# Patient Record
Sex: Female | Born: 1935 | Race: White | Hispanic: No | Smoking: Former smoker
Health system: Southern US, Community
[De-identification: ages and names within clinical notes are randomized; demographics above are authoritative.]

## PROBLEM LIST (undated history)

## (undated) DIAGNOSIS — I10 Essential (primary) hypertension: Secondary | ICD-10-CM

## (undated) DIAGNOSIS — R636 Underweight: Secondary | ICD-10-CM

## (undated) DIAGNOSIS — R5383 Other fatigue: Secondary | ICD-10-CM

## (undated) DIAGNOSIS — F0280 Dementia in other diseases classified elsewhere without behavioral disturbance: Secondary | ICD-10-CM

## (undated) DIAGNOSIS — R55 Syncope and collapse: Secondary | ICD-10-CM

## (undated) DIAGNOSIS — I4891 Unspecified atrial fibrillation: Secondary | ICD-10-CM

## (undated) HISTORY — DX: Essential (primary) hypertension: I10

## (undated) HISTORY — DX: Other fatigue: R53.83

## (undated) HISTORY — PX: HERNIA REPAIR: SHX51

## (undated) HISTORY — PX: TONSILLECTOMY: SHX5217

## (undated) HISTORY — DX: Unspecified atrial fibrillation: I48.91

## (undated) HISTORY — DX: Underweight: R63.6

## (undated) HISTORY — PX: ABDOMINAL HYSTERECTOMY: SHX81

## (undated) HISTORY — DX: Syncope and collapse: R55

## (undated) HISTORY — PX: SPLENECTOMY: SUR1306

---

## 2005-02-06 ENCOUNTER — Ambulatory Visit: Payer: Self-pay | Admitting: Internal Medicine

## 2005-06-13 ENCOUNTER — Ambulatory Visit: Payer: Self-pay | Admitting: Internal Medicine

## 2010-06-05 ENCOUNTER — Ambulatory Visit: Payer: Self-pay | Admitting: Internal Medicine

## 2010-09-02 HISTORY — PX: EYE SURGERY: SHX253

## 2012-03-04 ENCOUNTER — Ambulatory Visit: Payer: Self-pay | Admitting: Ophthalmology

## 2012-03-11 ENCOUNTER — Ambulatory Visit: Payer: Self-pay | Admitting: Ophthalmology

## 2012-04-27 ENCOUNTER — Ambulatory Visit: Payer: Self-pay | Admitting: Ophthalmology

## 2014-08-17 ENCOUNTER — Inpatient Hospital Stay: Payer: Self-pay | Admitting: Internal Medicine

## 2014-08-17 LAB — PROTIME-INR
INR: 1
PROTHROMBIN TIME: 13.1 s (ref 11.5–14.7)

## 2014-08-17 LAB — COMPREHENSIVE METABOLIC PANEL
ALT: 27 U/L
ANION GAP: 8 (ref 7–16)
Albumin: 3.9 g/dL (ref 3.4–5.0)
Alkaline Phosphatase: 87 U/L
BUN: 12 mg/dL (ref 7–18)
Bilirubin,Total: 0.5 mg/dL (ref 0.2–1.0)
CHLORIDE: 106 mmol/L (ref 98–107)
CO2: 25 mmol/L (ref 21–32)
Calcium, Total: 9.2 mg/dL (ref 8.5–10.1)
Creatinine: 1.03 mg/dL (ref 0.60–1.30)
EGFR (African American): >60
EGFR (Non-African Amer.): 55 — ABNORMAL LOW
GLUCOSE: 115 mg/dL — AB (ref 65–99)
Osmolality: 278 (ref 275–301)
Potassium: 3.8 mmol/L (ref 3.5–5.1)
SGOT(AST): 36 U/L (ref 15–37)
Sodium: 139 mmol/L (ref 136–145)
Total Protein: 7.8 g/dL (ref 6.4–8.2)

## 2014-08-17 LAB — CK TOTAL AND CKMB (NOT AT ARMC)
CK, TOTAL: 119 U/L (ref 26–192)
CK, TOTAL: 100 U/L (ref 26–192)
CK, Total: 89 U/L (ref 26–192)
CK-MB: 2 ng/mL (ref 0.5–3.6)
CK-MB: 1.7 ng/mL (ref 0.5–3.6)
CK-MB: 1.5 ng/mL (ref 0.5–3.6)

## 2014-08-17 LAB — TROPONIN I
Troponin-I: <0.02 ng/mL
Troponin-I: <0.02 ng/mL
Troponin-I: <0.02 ng/mL

## 2014-08-17 LAB — CBC WITH DIFFERENTIAL/PLATELET
Basophil #: 0.2 10*3/uL — ABNORMAL HIGH (ref 0.0–0.1)
Basophil %: 1.5 %
EOS ABS: 0 10*3/uL (ref 0.0–0.7)
Eosinophil %: 0.4 %
HCT: 42.7 % (ref 35.0–47.0)
HGB: 13.7 g/dL (ref 12.0–16.0)
LYMPHS PCT: 11.1 %
Lymphocyte #: 1.3 10*3/uL (ref 1.0–3.6)
MCH: 29.6 pg (ref 26.0–34.0)
MCHC: 32 g/dL (ref 32.0–36.0)
MCV: 93 fL (ref 80–100)
Monocyte #: 0.6 x10 3/mm (ref 0.2–0.9)
Monocyte %: 5.3 %
NEUTROS ABS: 9.2 10*3/uL — AB (ref 1.4–6.5)
NEUTROS PCT: 81.7 %
Platelet: 313 10*3/uL (ref 150–440)
RBC: 4.61 10*6/uL (ref 3.80–5.20)
RDW: 15.4 % — AB (ref 11.5–14.5)
WBC: 11.3 10*3/uL — ABNORMAL HIGH (ref 3.6–11.0)

## 2014-08-17 LAB — MAGNESIUM: MAGNESIUM: 2 mg/dL

## 2014-08-17 LAB — ETHANOL

## 2014-08-17 LAB — APTT

## 2014-08-17 LAB — LIPASE, BLOOD: Lipase: 340 U/L (ref 73–393)

## 2014-08-18 LAB — LIPID PANEL
CHOLESTEROL: 181 mg/dL (ref 0–200)
HDL Cholesterol: 63 mg/dL — ABNORMAL HIGH (ref 40–60)
Ldl Cholesterol, Calc: 105 mg/dL — ABNORMAL HIGH (ref 0–100)
Triglycerides: 66 mg/dL (ref 0–200)
VLDL Cholesterol, Calc: 13 mg/dL (ref 5–40)

## 2014-08-18 LAB — CBC WITH DIFFERENTIAL/PLATELET
Basophil #: 0.1 10*3/uL (ref 0.0–0.1)
Basophil %: 0.7 %
EOS ABS: 0.1 10*3/uL (ref 0.0–0.7)
Eosinophil %: 1.3 %
HCT: 38.4 % (ref 35.0–47.0)
HGB: 12.4 g/dL (ref 12.0–16.0)
LYMPHS ABS: 2 10*3/uL (ref 1.0–3.6)
Lymphocyte %: 24 %
MCH: 29.9 pg (ref 26.0–34.0)
MCHC: 32.4 g/dL (ref 32.0–36.0)
MCV: 92 fL (ref 80–100)
Monocyte #: 0.7 x10 3/mm (ref 0.2–0.9)
Monocyte %: 7.9 %
Neutrophil #: 5.5 10*3/uL (ref 1.4–6.5)
Neutrophil %: 66.1 %
Platelet: 299 10*3/uL (ref 150–440)
RBC: 4.16 10*6/uL (ref 3.80–5.20)
RDW: 15.1 % — ABNORMAL HIGH (ref 11.5–14.5)
WBC: 8.3 10*3/uL (ref 3.6–11.0)

## 2014-08-18 LAB — BASIC METABOLIC PANEL
ANION GAP: 8 (ref 7–16)
BUN: 13 mg/dL (ref 7–18)
CHLORIDE: 109 mmol/L — AB (ref 98–107)
CO2: 26 mmol/L (ref 21–32)
Calcium, Total: 8.7 mg/dL (ref 8.5–10.1)
Creatinine: 0.84 mg/dL (ref 0.60–1.30)
EGFR (African American): >60
GLUCOSE: 92 mg/dL (ref 65–99)
Osmolality: 285 (ref 275–301)
POTASSIUM: 3.5 mmol/L (ref 3.5–5.1)
SODIUM: 143 mmol/L (ref 136–145)

## 2014-08-18 LAB — TSH: THYROID STIMULATING HORM: 2.01 u[IU]/mL

## 2014-11-16 ENCOUNTER — Ambulatory Visit: Payer: Self-pay

## 2014-12-20 NOTE — Op Note (Signed)
PATIENT NAME:  Paula Thomas, Paula Thomas MR#:  [id number] DATE OF BIRTH:  1932/04/26  DATE OF PROCEDURE:  04/27/2012  PREOPERATIVE DIAGNOSIS:  Cataract, right eye.   POSTOPERATIVE DIAGNOSIS:  Cataract, right eye.  PROCEDURE PERFORMED:  Extracapsular cataract extraction using phacoemulsification with placement of an Alcon SN6CWS, 20.5-diopter posterior chamber lens, serial #[id number].  SURGEON:  Maylon PeppersSteven A. Samel Bruna, MD  ASSISTANT:  None.  ANESTHESIA:  4% lidocaine and 0.75% Marcaine in a 50/50 mixture with 10 units/mL of Hylenex added, given as a peribulbar.  ANESTHESIOLOGIST:  Dr. Dimple Caseyice  COMPLICATIONS:  None.  ESTIMATED BLOOD LOSS:  Less than 1 ml.  DESCRIPTION OF PROCEDURE:  The patient was brought to the operating room and given a peribulbar block.  The patient was then prepped and draped in the usual fashion.  The vertical rectus muscles were imbricated using 5-0 silk sutures.  These sutures were then clamped to the sterile drapes as bridle sutures.  A limbal peritomy was performed extending two clock hours and hemostasis was obtained with cautery.  A partial thickness scleral groove was made at the surgical limbus and dissected anteriorly in a lamellar dissection using an Alcon crescent knife.  The anterior chamber was entered superonasally with a Superblade and through the lamellar dissection with a 2.6 mm keratome.  DisCoVisc was used to replace the aqueous and a continuous tear capsulorrhexis was carried out.  Hydrodissection and hydrodelineation were carried out with balanced salt and a 27 gauge canula.  The nucleus was rotated to confirm the effectiveness of the hydrodissection.  Phacoemulsification was carried out using a divide-and-conquer technique.  Total ultrasound time was one minute and nine seconds with an average power of 14 percent. CDE 17.73.   Irrigation/aspiration was used to remove the residual cortex.  DisCoVisc was used to inflate the capsule and the internal incision  was enlarged to 3 mm with the crescent knife.  The intraocular lens was folded and inserted into the capsular bag using the AcrySert delivery system.  Irrigation/aspiration was used to remove the residual DisCoVisc.  Miostat was injected into the anterior chamber through the paracentesis track to inflate the anterior chamber and induce miosis.  The wound was checked for leaks and none were found. The conjunctiva was closed with cautery and the bridle sutures were removed.  Two drops of 0.3% Vigamox were placed on the eye.   An eye shield was placed on the eye.  The patient was discharged to the recovery room in good condition.   ____________________________ Maylon PeppersSteven A. Kambra Beachem, MD sad:ap D: 04/27/2012 13:49:00 ET T: 04/27/2012 14:08:38 ET JOB#: [id number]  cc: Viviann SpareSteven A. Eljay Lave, MD, <Dictator> Erline LevineSTEVEN A Raymonde Hamblin MD ELECTRONICALLY SIGNED 05/01/2012 12:40

## 2014-12-24 NOTE — Consult Note (Signed)
   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  Patient was noted to be in atrial fibrillation with rapid ventricular response.  She was given IV diltiazem and placed on an IV diltiazem drip.  Her rate is currently in   80-90  range with atrial fibrillation.  She is hemodynamically stable.  She is a somewhat difficult historian and is not aware of the surroundings or these situation that occurred at the time of her syncope.  She states she is had syncope in the past. she has been chronically anticoagulated with Eliquis.  Per the chart history, she had some facial droop prior to her syncopal episode.  Had CT revealed no acute pathology.   Physical Exam:  GEN well nourished, no acute distress   HEENT PERRL   NECK supple   RESP normal resp effort  no use of accessory muscles   CARD Irregular rate and rhythm  Tachycardic   ABD denies tenderness   LYMPH negative neck, negative axillae   EXTR negative cyanosis/clubbing, negative edema   SKIN normal to palpation   NEURO cranial nerves intact, motor/sensory function intact   PSYCH alert, poor insight   Review of Systems:  Subjective/Chief Complaint No complaints at present   General: No Complaints   Skin: No Complaints   ENT: No Complaints   Eyes: No Complaints   Neck: No Complaints   Respiratory: No Complaints   Cardiovascular: Palpitations   Gastrointestinal: No Complaints   Genitourinary: No Complaints   Vascular: No Complaints   Musculoskeletal: No Complaints   Neurologic: No Complaints   Hematologic: No Complaints   Endocrine: No Complaints   Psychiatric: No Complaints   Review of Systems: All other systems were reviewed and found to be negative   Medications/Allergies Reviewed Medications/Allergies reviewed   Family & Social History:  Family and Social History:  Family History Non-Contributory   EKG:  EKG Interp. by me    Interpretation atrial fibrillation with variable ventricular response    Penicillin: Unknown  Sulfa: Unknown   Impression 81 year old female with history of chronic atrial fibrillation treated with rate control and chronic anticoagulation with Eliquis who was admitted after suffering a apparent syncopal episode.  She apparently was volunteering a had a preschool when she was apparently noted to have a facial droop followed by a syncopal episode.  She was brought to the emergency room where she was noted to have atrial fibrillation with a rapid ventricular response with a heart rate in the 130 range.  Head CT did not reveal any acute process.  She was given IV diltiazem and her rate is improved.  She is ruled out for myocardia infarction thus far.  Will need to continue to follow or heart rate and continue Eliquis.  Will proceed with carotid Dopplers as well as an echocardiogram to evaluate for any significant structural abnormalities.  Will also follow for any arrhythmia.   Plan 1. Continue with Eliquis and IV diltiazem 2. Echocardiogram to evaluate LV function and for evidence of structural valvular abnormalities 3. Carotid Dopplers 4. follow on telemetry 5. The further recommendations depending on her course   Electronic Signatures: Dalia HeadingFath, Momina Hunton A (MD)  (Signed 16-Dec-15 15:40)  Authored: General Aspect/Present Illness, History and Physical Exam, Review of System, Family & Social History, EKG , Allergies, Impression/Plan   Last Updated: 16-Dec-15 15:40 by Dalia HeadingFath, Edyth Glomb A (MD)

## 2014-12-24 NOTE — H&P (Signed)
PATIENT NAME:  Paula Thomas, Paula Thomas MR#:  [id number] DATE OF BIRTH:  Nov 22, 1931  DATE OF ADMISSION:  08/17/2014  PRIMARY CARE PROVIDER: Nonlocal.  EMERGENCY DEPARTMENT REFERRING PHYSICIAN:  Sheran Fava. Quale, MD  CHIEF COMPLAINT: Syncope with collapse, as well as brief episode of a transient right-sided facial droop now with atrial fibrillation with rapid ventricular response.   HISTORY OF PRESENT ILLNESS: The patient is a 81 year old white female with history of having atrial fibrillation with a diagnosis for the past 6 months, who has seen Dr. Juliann Pares in the past and is on Eliquis. She was outside today, standing, when all of a sudden she started becoming limp and started having right-sided facial droop and she was taken to the chair where her hands kind of became limp bilaterally. Then, she was not responsive for about 5 minutes. She was brought to the ED and in the ER, she was noted to have a heart rate that is elevated in the 140s. The patient's face is back to normal. She denies any other weakness, no chest pains, no shortness of breath. Denies any nausea, vomiting or diarrhea.   PAST MEDICAL HISTORY: Significant for atrial fibrillation for the past 6 months, history of hypertension.   PAST SURGICAL HISTORY: Status post tonsillectomy, hysterectomy, splenectomy at age 13.   ALLERGIES: None.   MEDICATIONS: She is on Eliquis 2.5 mg 1 tab p.o. b.i.d., amlodipine 5 daily, and multivitamin 1 tab p.o. daily.   SOCIAL HISTORY: No smoking, no alcohol or drug use.   FAMILY HISTORY: Positive for lung cancer in the family.   REVIEW OF SYSTEMS: CONSTITUTIONAL: Denies any fevers. No weight loss. No weight gain.  EYES: No blurred or double vision. No cataracts. No glaucoma.  ENT: No tinnitus. No ear pain. No hearing loss. No seasonal or year-round allergies. No difficulty swallowing.  RESPIRATORY: Denies any cough, wheezing, hemoptysis. No, dyspnea. No COPD.  CARDIOVASCULAR: Denies any chest pain,  orthopnea, edema. Has history of atrial fibrillation.  GASTROINTESTINAL: No nausea, vomiting, diarrhea. No abdominal pain. No hematemesis. No melena. No IBS. No jaundice.  GENITOURINARY: Denies any dysuria, hematuria, renal calculus or frequency.  ENDOCRINE: Denies any polyuria, nocturia, thyroid problems.  HEMATOLOGIC AND LYMPHATIC: Denies anemia, easy bruisability or bleeding.  SKIN: No acne. No rash.  MUSCULOSKELETAL: No pain in the neck, back or shoulder.  NEUROLOGIC: No previous CVA, TIA or seizure.  PSYCHIATRIC: No anxiety, insomnia, or ADD.   PHYSICAL EXAMINATION:  VITAL SIGNS: Temperature 98.6, pulse was 144, respirations 18, blood pressure 155/100.  GENERAL: The patient is a thin female.  HEENT: Head atraumatic, normocephalic. Pupils equally round, reactive to light and accommodation. There is no conjunctival pallor. No sclerae icterus. Nasal exam shows no drainage or ulceration. Oropharynx clear without any exudate.  NECK: Supple without any thyromegaly.  CARDIOVASCULAR: Irregularly irregular rhythm, tachycardic. No murmurs, rubs, clicks, or gallops.  LUNGS: Clear to auscultation bilaterally without any rales, rhonchi, wheezing.  ABDOMEN: Soft, nontender, nondistended. Positive bowel sounds x4. No hepatosplenomegaly.  EXTREMITIES: No clubbing, cyanosis or edema.  SKIN: No rash.  LYMPH NODES: Nonpalpable.  VASCULAR: Good DP, PT pulses.  PSYCHIATRIC: Not anxious.  NEUROLOGIC: Cranial nerves 2 through 12 grossly intact. No focal deficits.  SKIN: No rash.  MUSCULOSKELETAL: There is no erythema or swelling.   EVALUATIONS:  Glucose 115, BUN 12, creatinine 1.03, sodium 139, potassium 3.8, chloride 106, CO2 of 25, calcium 9.2. Lipase 340. LFTs are normal. Troponin less than 0.02. WBC 11,300, hemoglobin 13.7, platelet count 313,000. INR  1.0. CT scan of the head without contrast showed no acute intracranial abnormality.   ASSESSMENT AND PLAN: The patient is a 81 year old, history of  atrial fibrillation on Eliquis, was standing when she developed facial droop, then passed out. Noted to have a heart rate in the 130s in the emergency room.  1. Atrial fibrillation with rapid ventricular response. At this time 1 dose of IV Cardizem will be given and then an IV Cardizem drip will be started. We will continue Eliquis. Cardiology consult for that.  2. Syncope with collapse, possibly could be related to her heart rate being high and her blood pressure dropping. We will monitor on telemetry. Also work-up for syncope, which will include echocardiogram and carotid Dopplers. Monitor for any other arrhythmias. 3. Possible transient ischemic attack. Low-dose aspirin. Continue Eliquis. Will get an echocardiogram and carotid Dopplers.  4. History of hypertension, will be on Cardizem drip. Hold Norvasc for the time-being 5. Miscellaneous. The patient already on Eliquis which should suffice for deep vein thrombosis prophylaxis.   TIME SPENT: 50 minutes on this H and P.     ____________________________ Serita GritShreyang H. Allena KatzPatel, MD shp:kl D: 08/17/2014 14:44:52 ET T: 08/17/2014 16:18:12 ET JOB#: [id number]  cc: Tiersa Dayley H. Allena KatzPatel, MD, <Dictator> Charise CarwinSHREYANG H Geraldine Tesar MD ELECTRONICALLY SIGNED 08/24/2014 16:09

## 2014-12-25 NOTE — Op Note (Signed)
PATIENT NAME:  Paula Thomas, Paula Thomas MR#:  [id number] DATE OF BIRTH:  May 29, 1932  DATE OF PROCEDURE:  03/11/2012  PREOPERATIVE DIAGNOSIS:  Cataract, left eye.    POSTOPERATIVE DIAGNOSIS:  Cataract, left eye.  PROCEDURE PERFORMED:  Extracapsular cataract extraction using phacoemulsification with placement of an Alcon SN6CWS, 20.5-diopter posterior chamber lens, serial # [id number].  SURGEON:  Maylon PeppersSteven A. Kayli Beal, MD  ASSISTANT:  None.  ANESTHESIA:  4% lidocaine and 0.75% Marcaine in a 50/50 mixture with 10 units per mL of Hylenex added, given as a peribulbar.  ANESTHESIOLOGIST:  Dr. Dimple Caseyice   COMPLICATIONS:  None.  ESTIMATED BLOOD LOSS:  Less than 1 mL.  DESCRIPTION OF PROCEDURE:  The patient was brought to the operating room and given a peribulbar block.  The patient was then prepped and draped in the usual fashion.  The vertical rectus muscles were imbricated using 5-0 silk sutures.  These sutures were then clamped to the sterile drapes as bridle sutures.  A limbal peritomy was performed extending two clock hours and hemostasis was obtained with cautery.  A partial thickness scleral groove was made at the surgical limbus and dissected anteriorly in a lamellar dissection using an Alcon crescent knife.  The anterior chamber was entered supero-temporally with a Superblade and through the lamellar dissection with a 2.6 mm keratome.  DisCoVisc was used to replace the aqueous and a continuous tear capsulorrhexis was carried out.  Hydrodissection and hydrodelineation were carried out with balanced salt and a 27 gauge canula.  The nucleus was rotated to confirm the effectiveness of the hydrodissection.  Phacoemulsification was carried out using a divide-and-conquer technique.  Total ultrasound time was 1 minute and 37 seconds with an average power of 14 percent, CDE 23.29.  Irrigation/aspiration was used to remove the residual cortex.  DisCoVisc was used to inflate the capsule and the internal  incision was enlarged to 3 mm with the crescent knife.  The intraocular lens was folded and inserted into the capsular bag using the AcrySert delivery system.  Irrigation/aspiration was used to remove the residual DisCoVisc.  Miostat was injected into the anterior chamber through the paracentesis track to inflate the anterior chamber and induce miosis.  The wound was checked for leaks and none were found. The conjunctiva was closed with cautery and the bridle sutures were removed.  Two drops of 0.3% Vigamox were placed on the eye.   An eye shield was placed on the eye.  The patient was discharged to the recovery room in good condition.  ____________________________ Maylon PeppersSteven A. Phylisha Dix, MD sad:drc D: 03/11/2012 11:56:47 ET T: 03/11/2012 12:20:09 ET JOB#: [id number]  cc: Viviann SpareSteven A. Treyshaun Keatts, MD, <Dictator> Erline LevineSTEVEN A Chalice Philbert MD ELECTRONICALLY SIGNED 03/16/2012 13:55

## 2014-12-28 NOTE — Discharge Summary (Signed)
PATIENT NAME:  Paula Thomas, Paula Thomas MR#:  [id number] DATE OF BIRTH:  1932/08/08  DATE OF ADMISSION:  08/17/2014 DATE OF DISCHARGE:  08/18/2014  ADMITTING DIAGNOSES:  1.  Syncope.  2.  Atrial fibrillation.   DISCHARGE DIAGNOSES: 1.  Syncope. 2.  Atrial fibrillation, rapid ventricular response.  3.  Transient ischemic attack with right facial droop, resolved.   4.  Hyperlipidemia with LDL of 105.  5.  Carotid artery stenosis of less than 50% on ultrasound.  6.  Malignant essential hypertension.   DISCHARGE CONDITION: Stable.   DISCHARGE MEDICATIONS:  1.  The patient is to continue multivitamins 1 daily.  2.  Eliquis 5 mg p.o. twice daily, this medication doses is advanced now; however, it is recommended to decreased patient's Eliquis dose to 2.5 daily dose when she reaches above 15 years old, which is going to be in the next few months.  3.  Aspirin 81 mg p.o. daily.  4.  Lisinopril 5 mg p.o. daily.  5.  Atorvastatin 40 mg p.o. daily.  6.  Diltiazem CD 120 mg p.o. twice daily.  7.  The patient is not to take Norvasc.   HOME OXYGEN: None.   DIET: 2 grams salt, low-fat, low-cholesterol, regular consistency.   ACTIVITY LIMITATIONS: As tolerated.   FOLLOWUP: Appointment with Dr. Arlana Pouch in 2 days after discharge, Dr. Lady Gary in 1 week after discharge.    CONSULTANTS: Care management, social work, Dr. Lady Gary.   RADIOLOGIC STUDIES: CT scan of head without contrast 08/17/2014, showed no acute intracranial abnormality, atrophy and chronic microvascular white matter ischemic changes were noted. Carotid ultrasound 08/18/2014, revealed bilateral carotid bifurcation and proximal RCA plaque resulting in less than 50% diameter stenosis, the exam does not exclude plaque ulceration or embolization, continued surveillance was recommended. Echocardiogram 08/18/2014, revealed left ventricular ejection fraction by visual estimation 70%-75%, normal global left ventricular systolic function, mildly dilated right  atrium, trivial pericardial effusion as described above, mild mitral valve regurgitation, mild aortic regurgitation, moderate tricuspid regurgitation.   HISTORY OF PRESENT ILLNESS: The patient is a 81 year old Caucasian female with past medical history significant for history of atrial fibrillation, history of hypertension who presents to the hospital with complaints of syncopal episode. Please refer to Dr. Serita Grit Patel's admission note on 08/17/2014. Apparently, patient was outside standing and all of a sudden she became limp and had right-sided facial droop and became unresponsive approximately 5 minutes. She was brought to Emergency Room. She was noted to have atrial fibrillation, heart rate of 140. On arrival to the hospital the patient's vital signs temperature was 98.6, pulse 144, respiration rate was 18, blood pressure 155/100. Physical exam was unremarkable except for irregularly irregular cardiac rythm. The patient's lab data done on arrival to the hospital revealed elevated glucose level to 115, otherwise BMP was unremarkable. The patient's lipase level was normal at 340. Magnesium was normal at 2.0. The patient's liver enzymes were normal. Cardiac enzymes x 3 were within normal limits. TSH was normal at 2.01. White blood cell count was mildly elevated to 11.3, hemoglobin was 15.7 and platelet count 313,000, absolute neutrophil count was elevated at 9.2. Coagulation panel was normal. EKG showed atrial fibrillation at rate of 123 beats per minute, no acute ST-T changes were noted. Chest x-ray was not done; however, the patient CT of head was performed to rule out stroke. She was admitted to the hospital with diagnosis of syncope, TIA, as well as atrial fibrillation, RVR. She had her Eliquis advanced to 5 mg twice  daily dose. Consultation with Dr. Lady Thomas was obtained. Dr. Lady Thomas saw the patient in consultation the same day, 08/17/2014. He felt the patient should continue Eliquis as well as her Cardizem IV  since the patient was placed on IV Cardizem drip. He recommended echocardiogram to evaluate left ventricular function and for evidence of structural valvular abnormalities. He recommended carotid Doppler and telemetry. The patient was continued in the intensive care unit on IV Cardizem, which was weaned off. She was started on oral long-acting Cardizem. Her heart rate remained stable in 90s when Cardizem CD was initiated. However, patient's heart rate increased to 140 as soon as she started ambulating necessitating advancement of Cardizem, which decision was made to advance it to twice daily dose. The patient is to continue Eliquis as well as aspirin therapy for TIA. Carotid ultrasound was performed, which revealed less than 50% stenosis. A lipid panel revealed LDL at 105. The patient was initiated on Lipitor to stabilize carotid plaque and decrease LDL to preferably below 70. The patient had echocardiogram, which was unremarkable except for minimal changes. In regard to malignant hypertension, the patient was initiated on Cardizem as mentioned above, and lisinopril was added to better control her blood pressure. On the day of discharge, the patient felt satisfactory, did not complain of any significant discomfort. She is being discharged home with the above-mentioned medications and followup. On the day of discharge, temperature was 98.3, pulse ranging from 81-140 on ambulation, respiration rate was 14-23, blood pressure 160/91, saturation was 98% on room air at rest.   TIME SPENT: 40 minutes.    ____________________________ Paula Caperima Fausto Sampedro, MD rv:bm D: 08/18/2014 19:37:00 ET T: 08/19/2014 04:59:08 ET JOB#: [id number]  cc: Paula FallenKenneth A. Lady GaryFath, MD Paula Bucksenny C. Arlana Pouchate, MD Paula Caperima Zoei Amison, MD, <Dictator>  Paula Cammack MD ELECTRONICALLY SIGNED 09/05/2014 21:11

## 2015-03-02 ENCOUNTER — Other Ambulatory Visit: Payer: Self-pay

## 2015-03-02 NOTE — Telephone Encounter (Signed)
Patients pharmacy is [facility]

## 2015-03-03 MED ORDER — LOSARTAN POTASSIUM 50 MG PO TABS: 50.0000 mg | ORAL_TABLET | Freq: Every day | ORAL | Status: DC

## 2015-03-07 DIAGNOSIS — N181 Chronic kidney disease, stage 1: Secondary | ICD-10-CM

## 2015-03-07 DIAGNOSIS — R55 Syncope and collapse: Secondary | ICD-10-CM | POA: Insufficient documentation

## 2015-03-07 DIAGNOSIS — R5383 Other fatigue: Secondary | ICD-10-CM | POA: Insufficient documentation

## 2015-03-07 DIAGNOSIS — N183 Chronic kidney disease, stage 3 unspecified: Secondary | ICD-10-CM

## 2015-03-07 DIAGNOSIS — I129 Hypertensive chronic kidney disease with stage 1 through stage 4 chronic kidney disease, or unspecified chronic kidney disease: Secondary | ICD-10-CM | POA: Insufficient documentation

## 2015-03-07 DIAGNOSIS — I4891 Unspecified atrial fibrillation: Secondary | ICD-10-CM

## 2015-03-07 DIAGNOSIS — I10 Essential (primary) hypertension: Secondary | ICD-10-CM

## 2015-03-07 DIAGNOSIS — R636 Underweight: Secondary | ICD-10-CM

## 2015-03-07 DIAGNOSIS — M1712 Unilateral primary osteoarthritis, left knee: Secondary | ICD-10-CM

## 2015-03-10 ENCOUNTER — Ambulatory Visit: Payer: Self-pay | Admitting: Unknown Physician Specialty

## 2015-03-17 ENCOUNTER — Ambulatory Visit (INDEPENDENT_AMBULATORY_CARE_PROVIDER_SITE_OTHER): Payer: Medicare Other | Admitting: Unknown Physician Specialty

## 2015-03-17 ENCOUNTER — Encounter: Payer: Self-pay | Admitting: Unknown Physician Specialty

## 2015-03-17 VITALS — BP 156/85 | HR 82 | Temp 98.1°F | Ht 62.5 in | Wt 111.6 lb

## 2015-03-17 DIAGNOSIS — N181 Chronic kidney disease, stage 1: Secondary | ICD-10-CM | POA: Diagnosis not present

## 2015-03-17 DIAGNOSIS — I129 Hypertensive chronic kidney disease with stage 1 through stage 4 chronic kidney disease, or unspecified chronic kidney disease: Secondary | ICD-10-CM | POA: Diagnosis not present

## 2015-03-17 DIAGNOSIS — M1712 Unilateral primary osteoarthritis, left knee: Secondary | ICD-10-CM

## 2015-03-17 MED ORDER — LOSARTAN POTASSIUM 100 MG PO TABS: 100.0000 mg | ORAL_TABLET | Freq: Every day | ORAL | Status: DC

## 2015-03-17 NOTE — Progress Notes (Signed)
BP 156/85 mmHg  Pulse 82  Temp(Src) 98.1 F (36.7 C)  Ht 5' 2.5" (1.588 m)  Wt 111 lb 9.6 oz (50.621 kg)  BMI 20.07 kg/m2  SpO2 95%   Subjective:    Patient ID: Paula Thomas, female    DOB: 06-Apr-1932, 81 y.o.   MRN: [id number]  HPI: Paula Thomas is a 81 y.o. female  Chief Complaint  Patient presents with  . Hypertension  . Knee Pain    pt states she got a knee injection at last appt but knee is now swollen again and it hurts when she walks    Relevant past medical, surgical, family and social history reviewed and updated as indicated. Interim medical history since our last visit reviewed. Allergies and medications reviewed and updated.  1.  HYPERTENSION Taking Losartan.  Does not check it at home so doesn't know what home readings are.   Hypertension status:  stable  H6  Satisfied with current treatment?  yes  H6  Duration of hypertension:  chronic  H4   BP medication side effects:  no P1  Medication compliance:  excellent compliance  P1  Previous BP meds:  none  P1  Aspirin:  no  Recurrent headaches:  no  R10 Visual changes:  no  R2  Palpitations:  no  R4  Dyspnea:  no  R5  Chest pain:  no  R4 Lower extremity edema:  no  R4 Dizzy/lightheaded:  no  R10   2.  KNEE PAIN Left knee Injected last visit.  Now it is swollen again. Stated that it is now back swollen and painful starting about 2 weeks ago     Duration:  chronic H4  Involved knee:  left  H1 Mechanism of injury:  unknown  Location:  diffuse H1 Severity:  comes and goes H3  Quality:  dull  H2  Frequency:  constant H5 Aggravating factors:  walking.  H7  Alleviating factors:  rest. H7 Status:  worse.  H6 Relief with NSAIDs?: aspercream H7  Sensation of giving way:  no  R8 Locking:  no  R8 Popping:  no  R8 Bruising:  no  R13   Swelling:  yes  R8 Redness:  no  R9    Review of Systems  Per HPI unless specifically indicated above     Objective:    BP 156/85 mmHg  Pulse 82   Temp(Src) 98.1 F (36.7 C)  Ht 5' 2.5" (1.588 m)  Wt 111 lb 9.6 oz (50.621 kg)  BMI 20.07 kg/m2  SpO2 95%  Wt Readings from Last 3 Encounters:  03/17/15 111 lb 9.6 oz (50.621 kg)  12/28/14 114 lb (51.71 kg)    Physical Exam  Constitutional: She is oriented to person, place, and time. She appears well-developed and well-nourished. No distress.  HENT:  Head: Normocephalic and atraumatic.  Eyes: Conjunctivae and lids are normal. Right eye exhibits no discharge. Left eye exhibits no discharge. No scleral icterus.  Cardiovascular: Normal rate, regular rhythm and normal heart sounds.   Pulmonary/Chest: Effort normal and breath sounds normal. No respiratory distress.  Abdominal: Normal appearance. She exhibits no distension. There is no splenomegaly or hepatomegaly. There is no tenderness.  Musculoskeletal:       Left knee: She exhibits swelling.  Neurological: She is alert and oriented to person, place, and time.  Skin: Skin is intact. No rash noted. No pallor.  Psychiatric: She has a normal mood and affect. Her behavior is normal. Judgment and  thought content normal.  Vitals reviewed.       Assessment & Plan:   Problem List Items Addressed This Visit      Cardiovascular and Mediastinum   Benign hypertension with CKD (chronic kidney disease) stage I - Primary    Not optimally controlled.  Increase Losartan to 100 mg      Relevant Medications   losartan (COZAAR) 100 MG tablet     Musculoskeletal and Integument   Osteoarthritis of left knee    Refer to orthopedics as steroid shot only working for about 3 months      Relevant Orders   Ambulatory referral to Orthopedic Surgery        Follow up plan: Return in about 4 weeks (around 04/14/2015).

## 2015-03-17 NOTE — Patient Instructions (Signed)
Increased Losartan from 50 to 100 mg.  You can take 2 50 mg tabs until finished and pick up a new prescription for 100 mg at the pharmacy.

## 2015-03-17 NOTE — Assessment & Plan Note (Signed)
Not optimally controlled.  Increase Losartan to 100 mg

## 2015-03-17 NOTE — Assessment & Plan Note (Signed)
Refer to orthopedics as steroid shot only working for about 3 months

## 2015-03-24 ENCOUNTER — Ambulatory Visit
Admission: EM | Admit: 2015-03-24 | Discharge: 2015-03-24 | Disposition: A | Discharge status: Home / Self Care | Payer: Medicare Other | Attending: Family Medicine | Admitting: Family Medicine

## 2015-03-24 ENCOUNTER — Encounter: Payer: Self-pay | Admitting: Emergency Medicine

## 2015-03-24 DIAGNOSIS — M5432 Sciatica, left side: Secondary | ICD-10-CM

## 2015-03-24 MED ORDER — HYDROCODONE-ACETAMINOPHEN 5-325 MG PO TABS: 1.0000 | ORAL_TABLET | Freq: Four times a day (QID) | ORAL | Status: DC | PRN

## 2015-03-24 MED ORDER — PREDNISONE 20 MG PO TABS
20.0000 mg | ORAL_TABLET | Freq: Every day | ORAL | Status: DC
Start: 2015-03-24 — End: 2015-04-14

## 2015-03-24 NOTE — ED Provider Notes (Signed)
CSN: [id number]     Arrival date & time 03/24/15  1058 History   First MD Initiated Contact with Patient 03/24/15 1150     Chief Complaint  Patient presents with  . Leg Pain    left upper leg   (Consider location/radiation/quality/duration/timing/severity/associated sxs/prior Treatment) HPI Comments: 81 yo female with a 3-4 days h/o left hip pain and low back pain radiating down the leg. States 5 days ago she received a cortisone injection in her left knee by her orthopedist. Since then has experienced the hip and low back pain and patient is unsure if this is related to her injection. Denies any fevers, chills, falls, injuries, rash. States it's more painful with position changes. Denies any numbness/tingling, bowel or bladder problems. She does feel unsteady when walking and states she does not have a cane or a walker.   The history is provided by the patient.    Past Medical History  Diagnosis Date  . Atrial fibrillation   . Syncope   . Underweight   . Fatigue   . Hypertension    Past Surgical History  Procedure Laterality Date  . Eye surgery Bilateral 2012    cataracts  . Hernia repair    . Splenectomy    . Tonsillectomy    . Abdominal hysterectomy     Family History  Problem Relation Age of Onset  . Cancer Mother   . Cancer Father   . Heart attack Brother   . Heart attack Brother    History  Substance Use Topics  . Smoking status: Former Games developer  . Smokeless tobacco: Never Used  . Alcohol Use: No   OB History    No data available     Review of Systems  Allergies  Lisinopril; Penicillin g potassium; and Sulfa antibiotics  Home Medications   Prior to Admission medications   Medication Sig Start Date End Date Taking? Authorizing Provider  apixaban (ELIQUIS) 5 MG TABS tablet Take 5 mg by mouth daily.    Historical Provider, MD  atorvastatin (LIPITOR) 40 MG tablet Take 40 mg by mouth daily.    Historical Provider, MD  diclofenac sodium (VOLTAREN) 1 % GEL  Apply topically 4 (four) times daily as needed.    Historical Provider, MD  diltiazem (CARDIZEM) 120 MG tablet Take 120 mg by mouth daily.    Historical Provider, MD  HYDROcodone-acetaminophen (NORCO/VICODIN) 5-325 MG per tablet Take 1 tablet by mouth every 6 (six) hours as needed. 03/24/15   Payton Mccallum, MD  losartan (COZAAR) 100 MG tablet Take 1 tablet (100 mg total) by mouth daily. 03/17/15   Gabriel Cirri, NP  Multiple Vitamin (MULTI-VITAMINS) TABS Take by mouth.    Historical Provider, MD  predniSONE (DELTASONE) 20 MG tablet Take 1 tablet (20 mg total) by mouth daily. 03/24/15   Payton Mccallum, MD   BP 143/94 mmHg  Pulse 93  Temp(Src) 98 F (36.7 C) (Tympanic)  Resp 16  Ht  (1.651 m)  Wt 120 lb (54.432 kg)  BMI 19.97 kg/m2  SpO2 97% Physical Exam  Constitutional: She appears well-developed and well-nourished. No distress.  HENT:  Head: Normocephalic.  Musculoskeletal:       Left hip: Normal.       Left knee: Normal.       Lumbar back: She exhibits tenderness. She exhibits no bony tenderness, no swelling, no edema, no deformity, no laceration and normal pulse.  Tenderness to palpation over the lumbar sacral paraspinous muscles on the left and  over the left buttock area; skin intact; no rashes; no bony tenderness  Neurological: She is alert. She has normal reflexes. She displays normal reflexes. She exhibits normal muscle tone. Coordination normal.  Skin: No rash noted. She is not diaphoretic.  Nursing note and vitals reviewed.   ED Course  Procedures (including critical care time) Labs Review Labs Reviewed - No data to display  Imaging Review No results found.   MDM   1. Sciatica, left    Discharge Medication List as of 03/24/2015 12:17 PM    START taking these medications   Details  HYDROcodone-acetaminophen (NORCO/VICODIN) 5-325 MG per tablet Take 1 tablet by mouth every 6 (six) hours as needed., Starting 03/24/2015, Until Discontinued, Print    predniSONE  (DELTASONE) 20 MG tablet Take 1 tablet (20 mg total) by mouth daily., Starting 03/24/2015, Until Discontinued, Normal      Plan: 1. diagnosis reviewed with patient 2. rx as per orders; risks, benefits, potential side effects reviewed with patient 3. Recommend supportive treatment with rest, ice/heat 4. Patient given a walker in clinic (high risk for fall; and on blood thinner) 5. F/u prn if symptoms worsen or don't improve    Payton Mccallum, MD 03/24/15 [phone]

## 2015-03-24 NOTE — ED Notes (Signed)
Patient c/o left upper leg pain for a week.  Patient denies injury

## 2015-03-24 NOTE — Discharge Instructions (Signed)
Sciatica Sciatica is pain, weakness, numbness, or tingling along the path of the sciatic nerve. The nerve starts in the lower back and runs down the back of each leg. The nerve controls the muscles in the lower leg and in the back of the knee, while also providing sensation to the back of the thigh, lower leg, and the sole of your foot. Sciatica is a symptom of another medical condition. For instance, nerve damage or certain conditions, such as a herniated disk or bone spur on the spine, pinch or put pressure on the sciatic nerve. This causes the pain, weakness, or other sensations normally associated with sciatica. Generally, sciatica only affects one side of the body. CAUSES   Herniated or slipped disc.  Degenerative disk disease.  A pain disorder involving the narrow muscle in the buttocks (piriformis syndrome).  Pelvic injury or fracture.  Pregnancy.  Tumor (rare). SYMPTOMS  Symptoms can vary from mild to very severe. The symptoms usually travel from the low back to the buttocks and down the back of the leg. Symptoms can include:  Mild tingling or dull aches in the lower back, leg, or hip.  Numbness in the back of the calf or sole of the foot.  Burning sensations in the lower back, leg, or hip.  Sharp pains in the lower back, leg, or hip.  Leg weakness.  Severe back pain inhibiting movement. These symptoms may get worse with coughing, sneezing, laughing, or prolonged sitting or standing. Also, being overweight may worsen symptoms. DIAGNOSIS  Your caregiver will perform a physical exam to look for common symptoms of sciatica. He or she may ask you to do certain movements or activities that would trigger sciatic nerve pain. Other tests may be performed to find the cause of the sciatica. These may include:  Blood tests.  X-rays.  Imaging tests, such as an MRI or CT scan. TREATMENT  Treatment is directed at the cause of the sciatic pain. Sometimes, treatment is not necessary  and the pain and discomfort goes away on its own. If treatment is needed, your caregiver may suggest:  Over-the-counter medicines to relieve pain.  Prescription medicines, such as anti-inflammatory medicine, muscle relaxants, or narcotics.  Applying heat or ice to the painful area.  Steroid injections to lessen pain, irritation, and inflammation around the nerve.  Reducing activity during periods of pain.  Exercising and stretching to strengthen your abdomen and improve flexibility of your spine. Your caregiver may suggest losing weight if the extra weight makes the back pain worse.  Physical therapy.  Surgery to eliminate what is pressing or pinching the nerve, such as a bone spur or part of a herniated disk. HOME CARE INSTRUCTIONS   Only take over-the-counter or prescription medicines for pain or discomfort as directed by your caregiver.  Apply ice to the affected area for 20 minutes, 3-4 times a day for the first 48-72 hours. Then try heat in the same way.  Exercise, stretch, or perform your usual activities if these do not aggravate your pain.  Attend physical therapy sessions as directed by your caregiver.  Keep all follow-up appointments as directed by your caregiver.  Do not wear high heels or shoes that do not provide proper support.  Check your mattress to see if it is too soft. A firm mattress may lessen your pain and discomfort. SEEK IMMEDIATE MEDICAL CARE IF:   You lose control of your bowel or bladder (incontinence).  You have increasing weakness in the lower back, pelvis, buttocks,   or legs.  You have redness or swelling of your back.  You have a burning sensation when you urinate.  You have pain that gets worse when you lie down or awakens you at night.  Your pain is worse than you have experienced in the past.  Your pain is lasting longer than 4 weeks.  You are suddenly losing weight without reason. MAKE SURE YOU:  Understand these  instructions.  Will watch your condition.  Will get help right away if you are not doing well or get worse. Document Released: 08/13/2001 Document Revised: 02/18/2012 Document Reviewed: 12/29/2011 Exit[employer] Patient Information 2015 [employer]. This information is not intended to replace advice given to you by your health care provider. Make sure you discuss any questions you have with your health care provider.  

## 2015-03-27 ENCOUNTER — Ambulatory Visit: Payer: Medicare Other | Admitting: Unknown Physician Specialty

## 2015-03-29 ENCOUNTER — Encounter: Payer: Self-pay | Admitting: Unknown Physician Specialty

## 2015-03-29 ENCOUNTER — Ambulatory Visit
Admission: RE | Admit: 2015-03-29 | Discharge: 2015-03-29 | Disposition: A | Discharge status: Home / Self Care | Payer: Medicare Other | Source: Ambulatory Visit | Attending: Unknown Physician Specialty | Admitting: Unknown Physician Specialty

## 2015-03-29 ENCOUNTER — Ambulatory Visit (INDEPENDENT_AMBULATORY_CARE_PROVIDER_SITE_OTHER): Payer: Medicare Other | Admitting: Unknown Physician Specialty

## 2015-03-29 VITALS — BP 124/75 | HR 98 | Temp 98.1°F | Ht 63.5 in | Wt 109.8 lb

## 2015-03-29 DIAGNOSIS — N181 Chronic kidney disease, stage 1: Secondary | ICD-10-CM | POA: Diagnosis not present

## 2015-03-29 DIAGNOSIS — M5432 Sciatica, left side: Secondary | ICD-10-CM

## 2015-03-29 DIAGNOSIS — I129 Hypertensive chronic kidney disease with stage 1 through stage 4 chronic kidney disease, or unspecified chronic kidney disease: Secondary | ICD-10-CM

## 2015-03-29 DIAGNOSIS — G3184 Mild cognitive impairment, so stated: Secondary | ICD-10-CM | POA: Diagnosis not present

## 2015-03-29 MED ORDER — LOSARTAN POTASSIUM 100 MG PO TABS: 100.0000 mg | ORAL_TABLET | Freq: Every day | ORAL | Status: DC

## 2015-03-29 MED ORDER — METHYLPREDNISOLONE 4 MG PO TBPK: ORAL_TABLET | ORAL | Status: DC

## 2015-03-29 NOTE — Progress Notes (Signed)
BP 124/75 mmHg  Pulse 98  Temp(Src) 98.1 F (36.7 C)  Ht 5' 3.5" (1.613 m)  Wt 109 lb 12.8 oz (49.805 kg)  BMI 19.14 kg/m2  SpO2 96%  LMP  (LMP Unknown)   Subjective:    Patient ID: Paula Thomas, female    DOB: 1931-12-09, 81 y.o.   MRN: [id number]  HPI: Paula Thomas is a 81 y.o. female  Chief Complaint  Patient presents with  . Hypertension    Relevant past medical, surgical, family and social history reviewed and updated as indicated. Interim medical history since our last visit reviewed. Allergies and medications reviewed and updated.  Hypertension I wanted to her back to f/u of her BP after increasing Losartan.  Her BP is good.  No complaints of SOB or chest pain.    Knee pain Saw Orthopedics for knee pain.  Got an injection and now it feels better.    Sciatica Went to the ER for sciatica pain.  After her knee injection, started having left low back pain.  She is using a walker which is helping her back pain.  Points to her low back, around the knee, and the ankle which hurts. She is having trouble navigating the walker.  Left ankle is swollen and has been that way for 2 days.  Of note, pt is taking a blood thinner for about 2 days.    Memory  Some issues with memory noticed by me and her daughter.    Review of Systems  Per HPI unless specifically indicated above     Objective:    BP 124/75 mmHg  Pulse 98  Temp(Src) 98.1 F (36.7 C)  Ht 5' 3.5" (1.613 m)  Wt 109 lb 12.8 oz (49.805 kg)  BMI 19.14 kg/m2  SpO2 96%  LMP  (LMP Unknown)  Wt Readings from Last 3 Encounters:  03/29/15 109 lb 12.8 oz (49.805 kg)  03/24/15 120 lb (54.432 kg)  03/17/15 111 lb 9.6 oz (50.621 kg)    Physical Exam  Constitutional: She is oriented to person, place, and time. She appears well-developed and well-nourished. No distress.  HENT:  Head: Normocephalic and atraumatic.  Eyes: Conjunctivae and lids are normal. Right eye exhibits no discharge. Left eye exhibits no  discharge. No scleral icterus.  Cardiovascular: Normal rate and regular rhythm.   Pulmonary/Chest: Effort normal. No respiratory distress.  Abdominal: Normal appearance. There is no splenomegaly or hepatomegaly.  Musculoskeletal:  Left ankle with some generalized swelling.    Neurological: She is alert and oriented to person, place, and time.  Skin: Skin is warm, dry and intact. No rash noted. No pallor.  Psychiatric: She has a normal mood and affect. Her behavior is normal. Judgment and thought content normal.  Nursing note and vitals reviewed.  MME 27  Assessment & Plan:   Problem List Items Addressed This Visit      Unprioritized   Benign hypertension with CKD (chronic kidney disease) stage I - Primary    Doing well.  Continue with Losartan      Relevant Medications   losartan (COZAAR) 100 MG tablet    Other Visit Diagnoses    Sciatica, left        Rx for Medrol dose pack.  Try a different walker.  Order for home health    Relevant Orders    DG Lumbar Spine Complete    Home Health    Face-to-face encounter (required for Medicare/Medicaid patients)    MCI (mild cognitive  impairment)        discussed monitoring        Follow up plan: Return in about 3 years (around 03/28/2018).

## 2015-03-29 NOTE — Assessment & Plan Note (Signed)
Doing well.  Continue with Losartan

## 2015-03-29 NOTE — Patient Instructions (Signed)
Home PT Get a different walker Get LS spine x-ray at [facility].

## 2015-04-05 ENCOUNTER — Telehealth: Payer: Self-pay | Admitting: Unknown Physician Specialty

## 2015-04-05 DIAGNOSIS — M5430 Sciatica, unspecified side: Secondary | ICD-10-CM

## 2015-04-05 NOTE — Telephone Encounter (Signed)
Routing to provider  

## 2015-04-05 NOTE — Telephone Encounter (Signed)
Routing to Tiffany

## 2015-04-05 NOTE — Telephone Encounter (Addendum)
She needs home PT.  I put an order for home health with the purpose of PT.  I put another order in now for home PT

## 2015-04-14 ENCOUNTER — Ambulatory Visit (INDEPENDENT_AMBULATORY_CARE_PROVIDER_SITE_OTHER): Payer: Medicare Other | Admitting: Unknown Physician Specialty

## 2015-04-14 ENCOUNTER — Encounter: Payer: Self-pay | Admitting: Unknown Physician Specialty

## 2015-04-14 VITALS — BP 131/78 | HR 94 | Temp 98.1°F | Wt 107.4 lb

## 2015-04-14 DIAGNOSIS — Z9181 History of falling: Secondary | ICD-10-CM | POA: Insufficient documentation

## 2015-04-14 DIAGNOSIS — M5432 Sciatica, left side: Secondary | ICD-10-CM | POA: Diagnosis not present

## 2015-04-14 DIAGNOSIS — I10 Essential (primary) hypertension: Secondary | ICD-10-CM | POA: Diagnosis not present

## 2015-04-14 DIAGNOSIS — R531 Weakness: Secondary | ICD-10-CM

## 2015-04-14 DIAGNOSIS — M5430 Sciatica, unspecified side: Secondary | ICD-10-CM | POA: Insufficient documentation

## 2015-04-14 NOTE — Progress Notes (Signed)
BP 131/78 mmHg  Pulse 94  Temp(Src) 98.1 F (36.7 C)  Wt 107 lb 6.4 oz (48.716 kg)  SpO2 95%  LMP  (LMP Unknown)   Subjective:    Patient ID: Paula Thomas, female    DOB: 1931-10-25, 81 y.o.   MRN: [id number]  HPI: Paula Thomas is a 81 y.o. female  Chief Complaint  Patient presents with  . Sciatica    pt states she is feeling better. States her back does not hurt and leg only hurts a little bit   Sciatica:  Reports pain is improved. Denies low back pain and minimal pain to left leg. Completed medrol prescription with no missed doses. Reports some  Pain to left leg and knee, unable to rate but it "only hurts a little bit".  Hypertension: Blood pressure stable.Denies chest pain or shortness of breath. She usually checks her pressure at home but her machine has stopped working. Her son is getting her another one. Left ankle swelling is done.  Weakness: She reports using her walker so "I don't fall". She reports improvement in getting around with the walker better. Denies falling. She wants to do more than she is currently and does not like having an aide come to the house to help her.  Relevant past medical, surgical, family and social history reviewed and updated as indicated. Interim medical history since our last visit reviewed. Allergies and medications reviewed and updated.  Review of Systems  Constitutional: Negative.  Negative for fever, chills and appetite change.  HENT: Negative.   Eyes: Negative.   Respiratory: Negative.  Negative for cough, chest tightness, shortness of breath, wheezing and stridor.   Cardiovascular: Negative.  Negative for chest pain, palpitations and leg swelling.  Musculoskeletal: Positive for back pain and gait problem.  Skin: Negative.  Negative for color change, pallor and rash.  Psychiatric/Behavioral: Negative.     Per HPI unless specifically indicated above     Objective:    BP 131/78 mmHg  Pulse 94  Temp(Src) 98.1 F (36.7  C)  Wt 107 lb 6.4 oz (48.716 kg)  SpO2 95%  LMP  (LMP Unknown)  Wt Readings from Last 3 Encounters:  04/14/15 107 lb 6.4 oz (48.716 kg)  03/29/15 109 lb 12.8 oz (49.805 kg)  03/24/15 120 lb (54.432 kg)    Physical Exam  Constitutional: She is oriented to person, place, and time. She appears well-developed and well-nourished. No distress.  HENT:  Head: Normocephalic and atraumatic.  Neck: Normal range of motion.  Cardiovascular: Normal rate, regular rhythm, normal heart sounds and intact distal pulses.  Exam reveals no gallop and no friction rub.   No murmur heard. Pulmonary/Chest: Effort normal.  Musculoskeletal: Normal range of motion. She exhibits no edema.  Neurological: She is alert and oriented to person, place, and time.  Skin: Skin is warm and dry. No rash noted. She is not diaphoretic. No erythema. No pallor.  Psychiatric: She has a normal mood and affect. Her behavior is normal. Judgment and thought content normal.        Assessment & Plan:   Problem List Items Addressed This Visit      Unprioritized   Hypertension    Blood pressure stable. 131/78.       Sciatica - Primary    Sciatica pain improved.       Weakness generalized    Ambulating with walker. Ordered home PT to assess      Risk for falls  Follow up plan: Follow up as needed. Home PT ordered for weakness.  Noted recent weight loss.  Will recheck back in 2-3 weeks

## 2015-04-14 NOTE — Assessment & Plan Note (Signed)
Ambulating with walker. Ordered home PT to assess

## 2015-04-14 NOTE — Assessment & Plan Note (Signed)
Sciatica pain improved.

## 2015-04-14 NOTE — Assessment & Plan Note (Signed)
Blood pressure stable. 131/78.

## 2015-05-22 ENCOUNTER — Other Ambulatory Visit: Payer: Self-pay | Admitting: Unknown Physician Specialty

## 2015-05-22 MED ORDER — LOSARTAN POTASSIUM 100 MG PO TABS: 100.0000 mg | ORAL_TABLET | Freq: Every day | ORAL | Status: DC

## 2015-10-04 ENCOUNTER — Ambulatory Visit: Payer: Medicare Other

## 2015-10-05 ENCOUNTER — Telehealth: Payer: Self-pay

## 2015-10-05 ENCOUNTER — Ambulatory Visit: Payer: Medicare Other

## 2015-10-05 DIAGNOSIS — Z23 Encounter for immunization: Secondary | ICD-10-CM

## 2015-10-05 NOTE — Telephone Encounter (Signed)
Patient is experiencing a lot of brusing, she is wondering if her medications could be causing it. Please call patient and discuss this with her.

## 2015-10-05 NOTE — Telephone Encounter (Addendum)
Yes they are.  She is on Elqquis given be the cardiologist which is a blood thinner.  Please let her know

## 2015-10-06 NOTE — Telephone Encounter (Signed)
Called and notified patient of what Elnita Maxwell said.

## 2016-01-09 ENCOUNTER — Ambulatory Visit (INDEPENDENT_AMBULATORY_CARE_PROVIDER_SITE_OTHER): Payer: Medicare Other | Admitting: Unknown Physician Specialty

## 2016-01-09 ENCOUNTER — Encounter: Payer: Self-pay | Admitting: Unknown Physician Specialty

## 2016-01-09 VITALS — BP 164/92 | HR 99 | Temp 97.7°F | Ht 59.0 in | Wt 107.8 lb

## 2016-01-09 DIAGNOSIS — I129 Hypertensive chronic kidney disease with stage 1 through stage 4 chronic kidney disease, or unspecified chronic kidney disease: Secondary | ICD-10-CM | POA: Diagnosis not present

## 2016-01-09 DIAGNOSIS — N181 Chronic kidney disease, stage 1: Secondary | ICD-10-CM

## 2016-01-09 DIAGNOSIS — R42 Dizziness and giddiness: Secondary | ICD-10-CM | POA: Diagnosis not present

## 2016-01-09 DIAGNOSIS — R413 Other amnesia: Secondary | ICD-10-CM

## 2016-01-09 DIAGNOSIS — R5382 Chronic fatigue, unspecified: Secondary | ICD-10-CM

## 2016-01-09 MED ORDER — HYDROCHLOROTHIAZIDE 25 MG PO TABS: 25.0000 mg | ORAL_TABLET | Freq: Every day | ORAL | Status: DC

## 2016-01-09 NOTE — Assessment & Plan Note (Signed)
Poor control today.  Add HCTZ to Cozaar.

## 2016-01-09 NOTE — Patient Instructions (Signed)
Add HCTZ 25 mg to other medications They will call you about your head CT

## 2016-01-09 NOTE — Progress Notes (Signed)
BP 164/92 mmHg  Pulse 99  Temp(Src) 97.7 F (36.5 C)  Ht 4' 11"  (1.499 m)  Wt 107 lb 12.8 oz (48.898 kg)  BMI 21.76 kg/m2  SpO2 97%  LMP  (LMP Unknown)   Subjective:    Patient ID: Paula Thomas, female    DOB: 18-Aug-1932, 82 y.o.   MRN: [id number]  HPI: Paula Thomas is a 82 y.o. female  Chief Complaint  Patient presents with  . Dizziness    States her head feels funny/fuzzy.    Pt states "I just don't feel good" and has no energy. This is ongoing for about 1 month.  Pt states she feels light-headed.  Her daughter has been asking her to go to the doctor.  No change in vision or hearing, gait.  Walks with a cane but this isn't new.  No headaches.  No changes in memory but admits to being forgetful and getting worse.   Labs done in December are WNL.  No nausea, vomiting, weight loss.  No problems urinating.  No recent falls.  She is complaining of bruising but on Eliquis  Relevant past medical, surgical, family and social history reviewed and updated as indicated. Interim medical history since our last visit reviewed. Allergies and medications reviewed and updated.  Review of Systems  Constitutional: Positive for fatigue. Negative for unexpected weight change.  HENT: Negative.   Eyes: Negative.   Respiratory: Negative.  Negative for chest tightness and wheezing.   Cardiovascular: Negative.  Negative for chest pain.  Gastrointestinal: Negative.   Endocrine: Negative.   Genitourinary: Negative.   Musculoskeletal: Negative.   Allergic/Immunologic: Negative.   Neurological: Negative for light-headedness.  Hematological: Negative.   Psychiatric/Behavioral: Negative.     Per HPI unless specifically indicated above     Objective:    BP 164/92 mmHg  Pulse 99  Temp(Src) 97.7 F (36.5 C)  Ht 4' 11"  (1.499 m)  Wt 107 lb 12.8 oz (48.898 kg)  BMI 21.76 kg/m2  SpO2 97%  LMP  (LMP Unknown)  Wt Readings from Last 3 Encounters:  01/09/16 107 lb 12.8 oz (48.898 kg)   04/14/15 107 lb 6.4 oz (48.716 kg)  03/29/15 109 lb 12.8 oz (49.805 kg)    Physical Exam  Constitutional: She is oriented to person, place, and time. She appears well-developed and well-nourished.  HENT:  Head: Normocephalic and atraumatic.  Eyes: Pupils are equal, round, and reactive to light. Right eye exhibits no discharge. Left eye exhibits no discharge. No scleral icterus.  Neck: Normal range of motion. Neck supple. Carotid bruit is not present. No thyromegaly present.  Cardiovascular: Normal rate, regular rhythm and normal heart sounds.  Exam reveals no gallop and no friction rub.   No murmur heard. Pulmonary/Chest: Effort normal and breath sounds normal. No respiratory distress. She has no wheezes. She has no rales.  Abdominal: Soft. Bowel sounds are normal. There is no tenderness. There is no rebound.  Genitourinary: No breast swelling, tenderness or discharge.  Musculoskeletal: Normal range of motion.  Lymphadenopathy:    She has no cervical adenopathy. + Neurological: She is alert and oriented to person, place, and time. She has normal strength. No cranial nerve deficit. She displays a negative Romberg sign.  Skin: Skin is warm, dry and intact. No rash noted.  Psychiatric: She has a normal mood and affect. Her speech is normal and behavior is normal. Judgment and thought content normal. Cognition and memory are normal.   No orthostatic changes  Results  for orders placed or performed in visit on 08/17/14  APTT  Result Value Ref Range   Activated PTT < 23.0 (L) 23.6-35.9 secs  CBC with Differential/Platelet  Result Value Ref Range   WBC 11.3 (H) 3.6-11.0 x10 3/mm 3   RBC 4.61 3.80-5.20 X10 6/mm 3   HGB 13.7 12.0-16.0 g/dL   HCT 42.7 35.0-47.0 %   MCV 93 80-100 fL   MCH 29.6 26.0-34.0 pg   MCHC 32.0 32.0-36.0 g/dL   RDW 15.4 (H) 11.5-14.5 %   Platelet 313 150-440 x10 3/mm 3   Neutrophil % 81.7 %   Lymphocyte % 11.1 %   Monocyte % 5.3 %   Eosinophil % 0.4 %    Basophil % 1.5 %   Neutrophil # 9.2 (H) 1.4-6.5 x10 3/mm 3   Lymphocyte # 1.3 1.0-3.6 x10 3/mm 3   Monocyte # 0.6 0.2-0.9 x10 3/mm    Eosinophil # 0.0 0.0-0.7 x10 3/mm 3   Basophil # 0.2 (H) 0.0-0.1 x10 3/mm 3  Comprehensive metabolic panel  Result Value Ref Range   Glucose 115 (H) 65-99 mg/dL   BUN 12 7-18 mg/dL   Creatinine 1.03 0.60-1.30 mg/dL   Sodium 139 136-145 mmol/L   Potassium 3.8 3.5-5.1 mmol/L   Chloride 106 98-107 mmol/L   Co2 25 21-32 mmol/L   Calcium, Total 9.2 8.5-10.1 mg/dL   SGOT(AST) 36 15-37 Unit/L   SGPT (ALT) 27 U/L   Alkaline Phosphatase 87 Unit/L   Albumin 3.9 3.4-5.0 g/dL   Total Protein 7.8 6.4-8.2 g/dL   Bilirubin,Total 0.5 0.2-1.0 mg/dL   Osmolality 278 275-301   Anion Gap 8 7-16   EGFR (African American) >60 >68m/min   EGFR (Non-African Amer.) 55 (L) >662mmin  Ethanol  Result Value Ref Range   Ethanol < 3 mg/dL  Lipase, blood  Result Value Ref Range   Lipase 340 73-393 Unit/L  Magnesium  Result Value Ref Range   Magnesium 2.0 mg/dL  Protime-INR  Result Value Ref Range   Prothrombin Time 13.1 11.5-14.7 secs   INR 1.0   Troponin I  Result Value Ref Range   Troponin-I < 0.02 ng/mL  CK total and CKMB (cardiac)  Result Value Ref Range   CK, Total 119 26-192 Unit/L   CK-MB 2.0 0.5-3.6 ng/mL  CK total and CKMB (cardiac)  Result Value Ref Range   CK, Total 100 26-192 Unit/L   CK-MB 1.7 0.5-3.6 ng/mL  Troponin I  Result Value Ref Range   Troponin-I < 0.02 ng/mL  Troponin I  Result Value Ref Range   Troponin-I < 0.02 ng/mL  CK total and CKMB (cardiac)  Result Value Ref Range   CK, Total 89 26-192 Unit/L   CK-MB 1.5 0.5-3.6 ng/mL  CBC with Differential/Platelet  Result Value Ref Range   WBC 8.3 3.6-11.0 x10 3/mm 3   RBC 4.16 3.80-5.20 X10 6/mm 3   HGB 12.4 12.0-16.0 g/dL   HCT 38.4 35.0-47.0 %   MCV 92 80-100 fL   MCH 29.9 26.0-34.0 pg   MCHC 32.4 32.0-36.0 g/dL   RDW 15.1 (H) 11.5-14.5 %   Platelet 299 150-440 x10 3/mm 3    Neutrophil % 66.1 %   Lymphocyte % 24.0 %   Monocyte % 7.9 %   Eosinophil % 1.3 %   Basophil % 0.7 %   Neutrophil # 5.5 1.4-6.5 x10 3/mm 3   Lymphocyte # 2.0 1.0-3.6 x10 3/mm 3   Monocyte # 0.7 0.2-0.9 x10 3/mm    Eosinophil #  0.1 0.0-0.7 x10 3/mm 3   Basophil # 0.1 0.0-0.1 x10 3/mm 3  Basic metabolic panel  Result Value Ref Range   Glucose 92 65-99 mg/dL   BUN 13 7-18 mg/dL   Creatinine 0.84 0.60-1.30 mg/dL   Sodium 143 136-145 mmol/L   Potassium 3.5 3.5-5.1 mmol/L   Chloride 109 (H) 98-107 mmol/L   Co2 26 21-32 mmol/L   Calcium, Total 8.7 8.5-10.1 mg/dL   Osmolality 285 275-301   Anion Gap 8 7-16   EGFR (African American) >60 >78m/min   EGFR (Non-African Amer.) >60 >640mmin  Lipid panel  Result Value Ref Range   Cholesterol 181 0-200 mg/dL   Triglycerides 66 0-200 mg/dL   HDL Cholesterol 63 (H) 40-60 mg/dL   VLDL Cholesterol, Calc 13 5-40 mg/dL   Ldl Cholesterol, Calc 105 (H) 0-100 mg/dL  TSH  Result Value Ref Range   Thyroid Stimulating Horm 2.01 uIU/mL      Assessment & Plan:   Problem List Items Addressed This Visit      Unprioritized   Benign hypertension with CKD (chronic kidney disease) stage I    Poor control today.  Add HCTZ to Cozaar.        Relevant Medications   hydrochlorothiazide (HYDRODIURIL) 25 MG tablet   Other Relevant Orders   Comprehensive metabolic panel   Fatigue   Relevant Orders   Comprehensive metabolic panel   CBC with Differential/Platelet   TSH    Other Visit Diagnoses    Dizzy    -  Primary    Relevant Orders    CT Head Wo Contrast    Memory deficit        Relevant Orders    CT Head Wo Contrast       No acute problems identified.  Check labs, improve control of BP, and order head CT as also on Eliquis   Follow up plan: Return in about 2 weeks (around 01/23/2016).

## 2016-01-10 LAB — COMPREHENSIVE METABOLIC PANEL
A/G RATIO: 1.8 (ref 1.2–2.2)
ALT: 18 IU/L (ref 0–32)
AST: 21 IU/L (ref 0–40)
Albumin: 4.5 g/dL (ref 3.5–4.7)
Alkaline Phosphatase: 102 IU/L (ref 39–117)
BUN/Creatinine Ratio: 17 (ref 12–28)
BUN: 18 mg/dL (ref 8–27)
Bilirubin Total: 0.3 mg/dL (ref 0.0–1.2)
CALCIUM: 9.9 mg/dL (ref 8.7–10.3)
CO2: 23 mmol/L (ref 18–29)
CREATININE: 1.03 mg/dL — AB (ref 0.57–1.00)
Chloride: 99 mmol/L (ref 96–106)
GFR, EST AFRICAN AMERICAN: 59 mL/min/{1.73_m2} — AB (ref >59)
GFR, EST NON AFRICAN AMERICAN: 51 mL/min/{1.73_m2} — AB (ref >59)
GLOBULIN, TOTAL: 2.5 g/dL (ref 1.5–4.5)
GLUCOSE: 97 mg/dL (ref 65–99)
POTASSIUM: 4.3 mmol/L (ref 3.5–5.2)
SODIUM: 142 mmol/L (ref 134–144)
Total Protein: 7 g/dL (ref 6.0–8.5)

## 2016-01-10 LAB — CBC WITH DIFFERENTIAL/PLATELET
BASOS: 0 %
Basophils Absolute: 0 10*3/uL (ref 0.0–0.2)
EOS (ABSOLUTE): 0.1 10*3/uL (ref 0.0–0.4)
EOS: 1 %
HEMATOCRIT: 37.6 % (ref 34.0–46.6)
Hemoglobin: 12.4 g/dL (ref 11.1–15.9)
IMMATURE GRANS (ABS): 0 10*3/uL (ref 0.0–0.1)
Immature Granulocytes: 0 %
LYMPHS: 20 %
Lymphocytes Absolute: 2.1 10*3/uL (ref 0.7–3.1)
MCH: 30.1 pg (ref 26.6–33.0)
MCHC: 33 g/dL (ref 31.5–35.7)
MCV: 91 fL (ref 79–97)
Monocytes Absolute: 0.6 10*3/uL (ref 0.1–0.9)
Monocytes: 6 %
NEUTROS ABS: 7.4 10*3/uL — AB (ref 1.4–7.0)
Neutrophils: 73 %
PLATELETS: 378 10*3/uL (ref 150–379)
RBC: 4.12 x10E6/uL (ref 3.77–5.28)
RDW: 14.2 % (ref 12.3–15.4)
WBC: 10.2 10*3/uL (ref 3.4–10.8)

## 2016-01-10 LAB — TSH: TSH: 2.27 u[IU]/mL (ref 0.450–4.500)

## 2016-01-12 ENCOUNTER — Ambulatory Visit
Admission: RE | Admit: 2016-01-12 | Discharge: 2016-01-12 | Disposition: A | Discharge status: Home / Self Care | Payer: Medicare Other | Source: Ambulatory Visit | Attending: Unknown Physician Specialty | Admitting: Unknown Physician Specialty

## 2016-01-12 DIAGNOSIS — R413 Other amnesia: Secondary | ICD-10-CM | POA: Insufficient documentation

## 2016-01-12 DIAGNOSIS — R42 Dizziness and giddiness: Secondary | ICD-10-CM | POA: Diagnosis present

## 2016-03-07 ENCOUNTER — Other Ambulatory Visit: Payer: Self-pay | Admitting: Unknown Physician Specialty

## 2016-04-24 ENCOUNTER — Ambulatory Visit (INDEPENDENT_AMBULATORY_CARE_PROVIDER_SITE_OTHER): Payer: Medicare Other | Admitting: Unknown Physician Specialty

## 2016-04-24 ENCOUNTER — Encounter: Payer: Self-pay | Admitting: Unknown Physician Specialty

## 2016-04-24 DIAGNOSIS — M1712 Unilateral primary osteoarthritis, left knee: Secondary | ICD-10-CM

## 2016-04-24 NOTE — Assessment & Plan Note (Signed)
Pt with flare.  I am reluctant to give an NSAID or steroid as she is on Xaralto.  [facility]  Written instructions given.  If no improvement, steroid injection and referral to Orthopedics.

## 2016-04-24 NOTE — Progress Notes (Signed)
BP 135/75 (BP Location: Left Arm, Patient Position: Sitting, Cuff Size: Small)   Pulse (!) 114   Temp 98.3 F (36.8 C)   Ht 5' 2.6" (1.59 m)   Wt 106 lb 12.8 oz (48.4 kg)   LMP  (LMP Unknown)   SpO2 95%   BMI 19.16 kg/m    Subjective:    Patient ID: Paula Thomas, female    DOB: [birth year], 82 y.o.   MRN: [id number]  HPI: Paula Lialma Dean Staheli is a 82 y.o. female  Chief Complaint  Patient presents with  . Leg Pain    pt states she has been having left leg pain and swelling from the knee down for a while now    Knee pain Pt having left knee pain "for quite a while."  States it hurts to walk.  Hurts to get up from a sitting position.  Reviewed x-rays done 3/16 showing some spurring.  Not keen on a steroid injection today.    Relevant past medical, surgical, family and social history reviewed and updated as indicated. Interim medical history since our last visit reviewed. Allergies and medications reviewed and updated.  Review of Systems  Per HPI unless specifically indicated above     Objective:    BP 135/75 (BP Location: Left Arm, Patient Position: Sitting, Cuff Size: Small)   Pulse (!) 114   Temp 98.3 F (36.8 C)   Ht 5' 2.6" (1.59 m)   Wt 106 lb 12.8 oz (48.4 kg)   LMP  (LMP Unknown)   SpO2 95%   BMI 19.16 kg/m   Wt Readings from Last 3 Encounters:  04/24/16 106 lb 12.8 oz (48.4 kg)  01/09/16 107 lb 12.8 oz (48.9 kg)  04/14/15 107 lb 6.4 oz (48.7 kg)    Physical Exam  Constitutional: She is oriented to person, place, and time. She appears well-developed and well-nourished. No distress.  HENT:  Head: Normocephalic and atraumatic.  Eyes: Conjunctivae and lids are normal. Right eye exhibits no discharge. Left eye exhibits no discharge. No scleral icterus.  Cardiovascular: Normal rate.   Pulmonary/Chest: Effort normal.  Abdominal: Normal appearance. There is no splenomegaly or hepatomegaly.  Musculoskeletal: Normal range of motion.       Left knee: She  exhibits swelling and effusion. She exhibits normal range of motion, no ecchymosis, no deformity and no bony tenderness. Tenderness found. Lateral joint line tenderness noted. No medial joint line tenderness noted.  Neurological: She is alert and oriented to person, place, and time.  Skin: Skin is intact. No rash noted. No pallor.  Psychiatric: She has a normal mood and affect. Her behavior is normal. Judgment and thought content normal.    Results for orders placed or performed in visit on 01/09/16  Comprehensive metabolic panel  Result Value Ref Range   Glucose 97 65 - 99 mg/dL   BUN 18 8 - 27 mg/dL   Creatinine, Ser 4.091.03 (H) 0.57 - 1.00 mg/dL   GFR calc non Af Amer 51 (L) >59 mL/min/1.73   GFR calc Af Amer 59 (L) >59 mL/min/1.73   BUN/Creatinine Ratio 17 12 - 28   Sodium 142 134 - 144 mmol/L   Potassium 4.3 3.5 - 5.2 mmol/L   Chloride 99 96 - 106 mmol/L   CO2 23 18 - 29 mmol/L   Calcium 9.9 8.7 - 10.3 mg/dL   Total Protein 7.0 6.0 - 8.5 g/dL   Albumin 4.5 3.5 - 4.7 g/dL   Globulin, Total 2.5 1.5 -  4.5 g/dL   Albumin/Globulin Ratio 1.8 1.2 - 2.2   Bilirubin Total 0.3 0.0 - 1.2 mg/dL   Alkaline Phosphatase 102 39 - 117 IU/L   AST 21 0 - 40 IU/L   ALT 18 0 - 32 IU/L  CBC with Differential/Platelet  Result Value Ref Range   WBC 10.2 3.4 - 10.8 x10E3/uL   RBC 4.12 3.77 - 5.28 x10E6/uL   Hemoglobin 12.4 11.1 - 15.9 g/dL   Hematocrit 16.137.6 09.634.0 - 46.6 %   MCV 91 79 - 97 fL   MCH 30.1 26.6 - 33.0 pg   MCHC 33.0 31.5 - 35.7 g/dL   RDW 04.514.2 40.912.3 - 81.115.4 %   Platelets 378 150 - 379 x10E3/uL   Neutrophils 73 %   Lymphs 20 %   Monocytes 6 %   Eos 1 %   Basos 0 %   Neutrophils Absolute 7.4 (H) 1.4 - 7.0 x10E3/uL   Lymphocytes Absolute 2.1 0.7 - 3.1 x10E3/uL   Monocytes Absolute 0.6 0.1 - 0.9 x10E3/uL   EOS (ABSOLUTE) 0.1 0.0 - 0.4 x10E3/uL   Basophils Absolute 0.0 0.0 - 0.2 x10E3/uL   Immature Granulocytes 0 %   Immature Grans (Abs) 0.0 0.0 - 0.1 x10E3/uL  TSH  Result Value Ref  Range   TSH 2.270 0.450 - 4.500 uIU/mL      Assessment & Plan:   Problem List Items Addressed This Visit      Unprioritized   Osteoarthritis of left knee    Pt with flare.  I am reluctant to give an NSAID or steroid as she is on Xaralto.  [facility]  Written instructions given.  If no improvement, steroid injection and referral to Orthopedics.         Other Visit Diagnoses   None.      Follow up plan: Return in about 4 weeks (around 05/22/2016).

## 2016-04-24 NOTE — Patient Instructions (Signed)
OK to take Tylenol or Acetomenophine.  If you take Tylenol for arthritis every 8 hours.

## 2016-05-01 ENCOUNTER — Other Ambulatory Visit: Payer: Self-pay | Admitting: Unknown Physician Specialty

## 2016-05-02 ENCOUNTER — Encounter: Payer: Self-pay | Admitting: Emergency Medicine

## 2016-05-02 ENCOUNTER — Emergency Department
Admission: EM | Admit: 2016-05-02 | Discharge: 2016-05-02 | Disposition: A | Discharge status: Home / Self Care | Payer: Medicare Other | Attending: Emergency Medicine | Admitting: Emergency Medicine

## 2016-05-02 DIAGNOSIS — I129 Hypertensive chronic kidney disease with stage 1 through stage 4 chronic kidney disease, or unspecified chronic kidney disease: Secondary | ICD-10-CM | POA: Diagnosis not present

## 2016-05-02 DIAGNOSIS — Z79899 Other long term (current) drug therapy: Secondary | ICD-10-CM | POA: Insufficient documentation

## 2016-05-02 DIAGNOSIS — N183 Chronic kidney disease, stage 3 (moderate): Secondary | ICD-10-CM | POA: Insufficient documentation

## 2016-05-02 DIAGNOSIS — Z87891 Personal history of nicotine dependence: Secondary | ICD-10-CM | POA: Insufficient documentation

## 2016-05-02 DIAGNOSIS — R55 Syncope and collapse: Secondary | ICD-10-CM

## 2016-05-02 LAB — BASIC METABOLIC PANEL
Anion gap: 10 (ref 5–15)
BUN: 17 mg/dL (ref 6–20)
CO2: 26 mmol/L (ref 22–32)
CREATININE: 1.27 mg/dL — AB (ref 0.44–1.00)
Calcium: 8.8 mg/dL — ABNORMAL LOW (ref 8.9–10.3)
Chloride: 97 mmol/L — ABNORMAL LOW (ref 101–111)
GFR calc Af Amer: 45 mL/min — ABNORMAL LOW (ref >60)
GFR, EST NON AFRICAN AMERICAN: 38 mL/min — AB (ref >60)
Glucose, Bld: 112 mg/dL — ABNORMAL HIGH (ref 65–99)
Potassium: 3.7 mmol/L (ref 3.5–5.1)
SODIUM: 133 mmol/L — AB (ref 135–145)

## 2016-05-02 LAB — CBC
HCT: 35.4 % (ref 35.0–47.0)
Hemoglobin: 12.2 g/dL (ref 12.0–16.0)
MCH: 31.6 pg (ref 26.0–34.0)
MCHC: 34.6 g/dL (ref 32.0–36.0)
MCV: 91.2 fL (ref 80.0–100.0)
PLATELETS: 304 10*3/uL (ref 150–440)
RBC: 3.88 MIL/uL (ref 3.80–5.20)
RDW: 13.3 % (ref 11.5–14.5)
WBC: 8.5 10*3/uL (ref 3.6–11.0)

## 2016-05-02 NOTE — ED Provider Notes (Signed)
[facility] Emergency Department Provider Note   ____________________________________________   First MD Initiated Contact with Patient 05/02/16 1411     (approximate)  I have reviewed the triage vital signs and the nursing notes.   HISTORY  Chief Complaint Near Syncope   HPI Paula Thomas is a 82 y.o. female with a history of atrial fibrillation as well as syncope who is presenting to the emergency department after syncopal episode. She says that she was walking outside in the heat and she passed out. She does not remember the event and denies remembering any preceding symptoms. She is accompanied by her son who says that she has had similar episodes in the past especially when walking a long distance. She said that [place]  when the event happened. Unclear if the patient hit her head when she passed out but she denies any headache at this time. Denies seeing any bump or laceration on her head. Says that she is feeling back to her baseline at this time.     Past Medical History:  Diagnosis Date  . Atrial fibrillation (HCC)   . Fatigue   . Hypertension   . Syncope   . Underweight     Patient Active Problem List   Diagnosis Date Noted  . Sciatica 04/14/2015  . Weakness generalized 04/14/2015  . Risk for falls 04/14/2015  . Atrial fibrillation (HCC) 03/07/2015  . Syncope 03/07/2015  . Underweight 03/07/2015  . Fatigue 03/07/2015  . Hypertension 03/07/2015  . Osteoarthritis of left knee 03/07/2015  . Benign hypertension with CKD (chronic kidney disease) stage I 03/07/2015  . CKD (chronic kidney disease), stage III 03/07/2015    Past Surgical History:  Procedure Laterality Date  . ABDOMINAL HYSTERECTOMY    . EYE SURGERY Bilateral 2012   cataracts  . HERNIA REPAIR    . SPLENECTOMY    . TONSILLECTOMY      Prior to Admission medications   Medication Sig Start Date End Date Taking? Authorizing Provider    apixaban (ELIQUIS) 2.5 MG TABS tablet Take 2.5 mg by mouth 2 (two) times daily. 03/07/16   Historical Provider, MD  diltiazem (CARDIZEM CD) 240 MG 24 hr capsule Take 240 mg by mouth daily. 03/07/16   Historical Provider, MD  hydrochlorothiazide (HYDRODIURIL) 25 MG tablet TAKE 1 TABLET BY MOUTH ONCE DAILY 05/01/16   Particia Nearingachel Elizabeth Lane, PA-C  losartan (COZAAR) 100 MG tablet Take 1 tablet (100 mg total) by mouth daily. 03/08/16   Gabriel Cirriheryl Wicker, NP  Multiple Vitamin (MULTI-VITAMINS) TABS Take by mouth.    Historical Provider, MD    Allergies Lisinopril; Penicillin g potassium [penicillin g]; and Sulfa antibiotics  Family History  Problem Relation Age of Onset  . Cancer Mother   . Cancer Father   . Heart attack Brother   . Heart attack Brother     Social History Social History  Substance Use Topics  . Smoking status: Former Games developermoker  . Smokeless tobacco: Never Used  . Alcohol use No    Review of Systems Constitutional: No fever/chills Eyes: No visual changes. ENT: No sore throat. Cardiovascular: Denies chest pain. Respiratory: Denies shortness of breath. Gastrointestinal: No abdominal pain.  No nausea, no vomiting.  No diarrhea.  No constipation. Genitourinary: Negative for dysuria. Musculoskeletal: Negative for back pain. Skin: Negative for rash. Neurological: Negative for headaches, focal weakness or numbness.  10-point ROS otherwise negative.  ____________________________________________   PHYSICAL EXAM:  VITAL SIGNS: ED Triage Vitals  Enc Vitals Group     BP 05/02/16 1404 135/70     Pulse Rate 05/02/16 1404 86     Resp 05/02/16 1404 18     Temp 05/02/16 1404 97.9 F (36.6 C)     Temp Source 05/02/16 1404 Oral     SpO2 05/02/16 1404 96 %     Weight 05/02/16 1409 106 lb (48.1 kg)     Height 05/02/16 1409 5\' 4"  (1.626 m)     Head Circumference --      Peak Flow --      Pain Score --      Pain Loc --      Pain Edu? --      Excl. in GC? --     Constitutional:  Alert and oriented. Well appearing and in no acute distress. Eyes: Conjunctivae are normal. PERRL. EOMI. Head: Atraumatic. Nose: No congestion/rhinnorhea. Mouth/Throat: Mucous membranes are moist.   Neck: No stridor.  No tenderness to the midline C-spine. No deformity or step-off. Cardiovascular: Normal rate, regular rhythm. Grossly normal heart sounds.   Respiratory: Normal respiratory effort.  No retractions. Lungs CTAB. Gastrointestinal: Soft and nontender. No distention.  Musculoskeletal: No lower extremity tenderness nor edema.  No joint effusions. Neurologic:  Normal speech and language. No gross focal neurologic deficits are appreciated.  Skin:  Skin is warm, dry and intact. No rash noted. Psychiatric: Mood and affect are normal. Speech and behavior are normal.  ____________________________________________   LABS (all labs ordered are listed, but only abnormal results are displayed)  Labs Reviewed  BASIC METABOLIC PANEL - Abnormal; Notable for the following:       Result Value   Sodium 133 (*)    Chloride 97 (*)    Glucose, Bld 112 (*)    Creatinine, Ser 1.27 (*)    Calcium 8.8 (*)    GFR calc non Af Amer 38 (*)    GFR calc Af Amer 45 (*)    All other components within normal limits  CBC   ____________________________________________  EKG  ED ECG REPORT I, Arelia Longest, the attending physician, personally viewed and interpreted this ECG.   Date: 05/02/2016  EKG Time: 1421  Rate: 71  Rhythm: atrial fibrillation, rate 71  Axis: Normal  Intervals:none  ST&T Change: No abnormal T-wave inversions. No ST elevation or depression.  ____________________________________________  RADIOLOGY   ____________________________________________   PROCEDURES  Procedure(s) performed:   Procedures  Critical Care performed:   ____________________________________________   INITIAL IMPRESSION / ASSESSMENT AND PLAN / ED COURSE  Pertinent labs & imaging results  that were available during my care of the patient were reviewed by me and considered in my medical decision making (see chart for details).  ----------------------------------------- 2:54 PM on 05/02/2016 -----------------------------------------  Patient's son is at the bedside was able to call the neighbor, Rosanne Ashing, who said that he was able to lower the patient to the ground when he saw her passing out. The patient did not fall and did not sustain any trauma. This appears to be a vasovagal episode. We discussed reducing her intake of T and increasing her intake of water. She'll be discharged home.  Clinical Course     ____________________________________________   FINAL CLINICAL IMPRESSION(S) / ED DIAGNOSES  Vasovagal syncope.    NEW MEDICATIONS STARTED DURING THIS VISIT:  New Prescriptions   No medications on file     Note:  This document was prepared using Dragon voice recognition software and may include unintentional  dictation errors.    Myrna Blazer, MD 05/02/16 1455

## 2016-05-02 NOTE — ED Notes (Signed)
Patient does have history of a fib.

## 2016-05-02 NOTE — ED Triage Notes (Addendum)
Per [facility], patient comes from home. Patient was outside walking, and began to feel dizzy. A bystander assisted patient to the ground Hx of same. When EMS arrived patient was very weak and pale. EMS started a 500cc bolus of NS. Patient was a fib on the monitor, unable to state if that is new. Patient sees Dr. Juliann Paresallwood. Patient is A&O x4. Denies any pain at this time.

## 2016-05-28 ENCOUNTER — Ambulatory Visit (INDEPENDENT_AMBULATORY_CARE_PROVIDER_SITE_OTHER): Payer: Medicare Other | Admitting: Unknown Physician Specialty

## 2016-05-28 ENCOUNTER — Encounter: Payer: Self-pay | Admitting: Unknown Physician Specialty

## 2016-05-28 VITALS — BP 135/71 | HR 73 | Temp 98.3°F | Ht 62.8 in | Wt 110.0 lb

## 2016-05-28 DIAGNOSIS — M1712 Unilateral primary osteoarthritis, left knee: Secondary | ICD-10-CM | POA: Diagnosis not present

## 2016-05-28 DIAGNOSIS — B079 Viral wart, unspecified: Secondary | ICD-10-CM | POA: Diagnosis not present

## 2016-05-28 NOTE — Assessment & Plan Note (Signed)
Injection given.  Pt states she had improved when left the office

## 2016-05-28 NOTE — Assessment & Plan Note (Signed)
Pt states improvement after shaving and cryo

## 2016-05-28 NOTE — Progress Notes (Signed)
BP 135/71 (BP Location: Left Arm, Patient Position: Sitting, Cuff Size: Small)   Pulse 73   Temp 98.3 F (36.8 C)   Ht 5' 2.8" (1.595 m)   Wt 110 lb (49.9 kg)   LMP  (LMP Unknown)   SpO2 93%   BMI 19.61 kg/m    Subjective:    Patient ID: Paula Thomas, female    DOB: 02/26/1932, 82 y.o.   MRN: [id number]  HPI: Paula Thomas is a 82 y.o. female  Chief Complaint  Patient presents with  . Knee Pain    follow up for left knee pain  . Foot Problem    patinet has place on left foot  that she would liked looked at   Pt is here with two problems  Knee pain Pt having left knee pain and swelling for the last several months.  Walking with a cane.  She does not want to consider surgery at this point.  States it hurts all the time and worse with weight bearing.    Foot pain Pt with an area on left foot that has been painful for several weeks.  She is putting a patch on it to get some cushioning.     Relevant past medical, surgical, family and social history reviewed and updated as indicated. Interim medical history since our last visit reviewed. Allergies and medications reviewed and updated.  Review of Systems  Per HPI unless specifically indicated above     Objective:    BP 135/71 (BP Location: Left Arm, Patient Position: Sitting, Cuff Size: Small)   Pulse 73   Temp 98.3 F (36.8 C)   Ht 5' 2.8" (1.595 m)   Wt 110 lb (49.9 kg)   LMP  (LMP Unknown)   SpO2 93%   BMI 19.61 kg/m   Wt Readings from Last 3 Encounters:  05/28/16 110 lb (49.9 kg)  05/02/16 106 lb (48.1 kg)  04/24/16 106 lb 12.8 oz (48.4 kg)    Physical Exam  Constitutional: She is oriented to person, place, and time. She appears well-developed and well-nourished. No distress.  HENT:  Head: Normocephalic and atraumatic.  Eyes: Conjunctivae and lids are normal. Right eye exhibits no discharge. Left eye exhibits no discharge. No scleral icterus.  Cardiovascular: Normal rate.   Pulmonary/Chest:  Effort normal.  Abdominal: Normal appearance. There is no splenomegaly or hepatomegaly.  Musculoskeletal:       Left knee: She exhibits decreased range of motion, swelling and effusion.  Neurological: She is alert and oriented to person, place, and time.  Skin: Skin is intact. No rash noted. No pallor.  Bottom of left foot on left ball of foot she has a great deal of keratosis.  This was shaved with a scalpel revealing a wart.  I freeze cycle done.    Psychiatric: She has a normal mood and affect. Her behavior is normal. Judgment and thought content normal.   STEROID INJECTION  Procedure: Knee Intraarticular Steroid Injection   Description: After verbal consent and patient ed on Area prepped and draped using  semi-sterile technique. Using a anterior  approach, a mixture of 4 cc of  1% Marcaine & 1 cc of Kenalog 40 was injected into knee joint.  A bandage was then placed over the injection site. Complications:  none Post Procedure Instructions: To the ER if any symptoms of erythema or swelling.      Results for orders placed or performed during the hospital encounter of 05/02/16  Basic metabolic  panel  Result Value Ref Range   Sodium 133 (L) 135 - 145 mmol/L   Potassium 3.7 3.5 - 5.1 mmol/L   Chloride 97 (L) 101 - 111 mmol/L   CO2 26 22 - 32 mmol/L   Glucose, Bld 112 (H) 65 - 99 mg/dL   BUN 17 6 - 20 mg/dL   Creatinine, Ser 7.251.27 (H) 0.44 - 1.00 mg/dL   Calcium 8.8 (L) 8.9 - 10.3 mg/dL   GFR calc non Af Amer 38 (L) >60 mL/min   GFR calc Af Amer 45 (L) >60 mL/min   Anion gap 10 5 - 15  CBC  Result Value Ref Range   WBC 8.5 3.6 - 11.0 K/uL   RBC 3.88 3.80 - 5.20 MIL/uL   Hemoglobin 12.2 12.0 - 16.0 g/dL   HCT 36.635.4 44.035.0 - 34.747.0 %   MCV 91.2 80.0 - 100.0 fL   MCH 31.6 26.0 - 34.0 pg   MCHC 34.6 32.0 - 36.0 g/dL   RDW 42.513.3 95.611.5 - 38.714.5 %   Platelets 304 150 - 440 K/uL      Assessment & Plan:   Problem List Items Addressed This Visit      Unprioritized   Osteoarthritis of left  knee - Primary    Injection given.  Pt states she had improved when left the office      Wart    Pt states improvement after shaving and cryo       Other Visit Diagnoses   None.      Follow up plan: Return in about 2 weeks (around 06/11/2016).

## 2016-06-11 ENCOUNTER — Ambulatory Visit (INDEPENDENT_AMBULATORY_CARE_PROVIDER_SITE_OTHER): Payer: Medicare Other | Admitting: Unknown Physician Specialty

## 2016-06-11 ENCOUNTER — Encounter: Payer: Self-pay | Admitting: Unknown Physician Specialty

## 2016-06-11 VITALS — BP 119/69 | Temp 97.7°F | Ht 62.6 in | Wt 104.8 lb

## 2016-06-11 DIAGNOSIS — Z9181 History of falling: Secondary | ICD-10-CM | POA: Diagnosis not present

## 2016-06-11 DIAGNOSIS — T148XXA Other injury of unspecified body region, initial encounter: Secondary | ICD-10-CM | POA: Diagnosis not present

## 2016-06-11 DIAGNOSIS — R4189 Other symptoms and signs involving cognitive functions and awareness: Secondary | ICD-10-CM | POA: Diagnosis not present

## 2016-06-11 MED ORDER — DONEPEZIL HCL 5 MG PO TABS: 5.0000 mg | ORAL_TABLET | Freq: Every day | ORAL | 0 refills | Status: DC

## 2016-06-11 MED ORDER — DONEPEZIL HCL 10 MG PO TABS: 10.0000 mg | ORAL_TABLET | Freq: Every day | ORAL | 3 refills | Status: DC

## 2016-06-11 NOTE — Assessment & Plan Note (Addendum)
Get [facility] referreral to assess needs.  Start Donepezil.  Discussed pt with family

## 2016-06-11 NOTE — Progress Notes (Signed)
BP 119/69 (BP Location: Left Arm, Patient Position: Sitting, Cuff Size: Normal)   Temp 97.7 F (36.5 C)   Ht 5' 2.6" (1.59 m)   Wt 104 lb 12.8 oz (47.5 kg)   LMP  (LMP Unknown)   SpO2 98%   BMI 18.80 kg/m    Subjective:    Patient ID: Paula Thomas, female    DOB: 02-01-1932, 82 y.o.   MRN: [id number]  HPI: Paula Lialma Dean Brickman is a 82 y.o. female  Chief Complaint  Patient presents with  . Follow-up    on knee pain. Pts son states that she passed out last week and cut her arm ad leg, he said that she doesn't know where she passes out. He also wants to know if he needs a refferal for in home health care.    Pt states her knee is better after the injection.  However she is falling and having difficulty remembering that she has fallen.  Son is asking if a referral for home health care is needed.    She is having trouble with her memory.  She does not remember the injection I gave her 2 weeks ago..  Over time I have noticed plroblems with following directions.  Family has noticed  Relevant past medical, surgical, family and social history reviewed and updated as indicated. Interim medical history since our last visit reviewed. Allergies and medications reviewed and updated.  Review of Systems  Per HPI unless specifically indicated above     Objective:    BP 119/69 (BP Location: Left Arm, Patient Position: Sitting, Cuff Size: Normal)   Temp 97.7 F (36.5 C)   Ht 5' 2.6" (1.59 m)   Wt 104 lb 12.8 oz (47.5 kg)   LMP  (LMP Unknown)   SpO2 98%   BMI 18.80 kg/m   Wt Readings from Last 3 Encounters:  06/11/16 104 lb 12.8 oz (47.5 kg)  05/28/16 110 lb (49.9 kg)  05/02/16 106 lb (48.1 kg)    Physical Exam  Constitutional: She is oriented to person, place, and time. She appears well-developed and well-nourished. No distress.  HENT:  Head: Normocephalic and atraumatic.  Eyes: Conjunctivae and lids are normal. Right eye exhibits no discharge. Left eye exhibits no discharge.  No scleral icterus.  Neck: Normal range of motion. Neck supple. No JVD present. Carotid bruit is not present.  Cardiovascular: Normal rate, regular rhythm and normal heart sounds.   Pulmonary/Chest: Effort normal and breath sounds normal.  Abdominal: Normal appearance. There is no splenomegaly or hepatomegaly.  Musculoskeletal: Normal range of motion.  Neurological: She is alert and oriented to person, place, and time.  Skin: Skin is warm, dry and intact. No rash noted. No pallor.  Has abrasion on left knee with various bruises.    Psychiatric: She has a normal mood and affect. Her behavior is normal. Judgment and thought content normal.   Mini mental is 24/30  Results for orders placed or performed during the hospital encounter of 05/02/16  Basic metabolic panel  Result Value Ref Range   Sodium 133 (L) 135 - 145 mmol/L   Potassium 3.7 3.5 - 5.1 mmol/L   Chloride 97 (L) 101 - 111 mmol/L   CO2 26 22 - 32 mmol/L   Glucose, Bld 112 (H) 65 - 99 mg/dL   BUN 17 6 - 20 mg/dL   Creatinine, Ser 4.091.27 (H) 0.44 - 1.00 mg/dL   Calcium 8.8 (L) 8.9 - 10.3 mg/dL   GFR calc non  Af Amer 38 (L) >60 mL/min   GFR calc Af Amer 45 (L) >60 mL/min   Anion gap 10 5 - 15  CBC  Result Value Ref Range   WBC 8.5 3.6 - 11.0 K/uL   RBC 3.88 3.80 - 5.20 MIL/uL   Hemoglobin 12.2 12.0 - 16.0 g/dL   HCT 78.2 95.6 - 21.3 %   MCV 91.2 80.0 - 100.0 fL   MCH 31.6 26.0 - 34.0 pg   MCHC 34.6 32.0 - 36.0 g/dL   RDW 08.6 57.8 - 46.9 %   Platelets 304 150 - 440 K/uL      Assessment & Plan:   Problem List Items Addressed This Visit      Unprioritized   Cognitive impairment    Get [facility] referreral to assess needs.  Start Donepezil.  Discussed pt with family      Risk for falls    Needs home PT and OT      Relevant Orders   Ambulatory referral to Home Health    Other Visit Diagnoses    Abrasion    -  Primary   Areas covered with antibiotic ointment       Follow up plan: Return in about 3 months (around  09/11/2016).

## 2016-06-11 NOTE — Assessment & Plan Note (Signed)
Needs home PT and OT

## 2016-06-12 ENCOUNTER — Telehealth: Payer: Self-pay | Admitting: Unknown Physician Specialty

## 2016-06-12 NOTE — Telephone Encounter (Signed)
Discussed with son number [phone] about home care services for mother.  The children are aware and exploring options for care.

## 2016-06-14 ENCOUNTER — Telehealth: Payer: Self-pay

## 2016-06-14 NOTE — Telephone Encounter (Signed)
-----   Message from Marlow Baarseresa L Elkins sent at 06/13/2016  1:59 PM EDT ----- Regarding: Referral Hello [place],  [facility]. I have a question. The insurance card on file says it was scanned in 03/17/15. Is still her current insurance information?  Thanks, Lucia Gaskinseresa Elkins [facility] Care Management Assistant

## 2016-06-14 NOTE — Telephone Encounter (Signed)
Hi Teresa. The patient has been seen in our office within the last 2 weeks so I am assuming this is the correct insurance. I would hope they would scan her new card up front if she had a new one.   Thanks, Courtney ParisBrittany Pharr, CMA

## 2016-06-18 ENCOUNTER — Telehealth: Payer: Self-pay | Admitting: Unknown Physician Specialty

## 2016-06-18 NOTE — Telephone Encounter (Signed)
Stop the Donepezil.  It is not worth those side effects

## 2016-06-18 NOTE — Telephone Encounter (Signed)
Called and let patient's son know what Paula Thomas said.

## 2016-06-18 NOTE — Telephone Encounter (Signed)
Called and spoke to patient's son. He stated that ever since the patient started on Donepezil she has not been eating, nothing tasting good, and feeling tired all of the time. He would like to know what to do.

## 2016-06-18 NOTE — Telephone Encounter (Signed)
Pt's con Paula Thomas called stated that since the change in pt's medication the patient is not eating and states nothing tastes good. Stated the pt also seems to be tired all of the time. Please call to follow up with son ASAP. Thanks.

## 2016-07-26 ENCOUNTER — Other Ambulatory Visit: Payer: Self-pay | Admitting: Family Medicine

## 2016-07-29 NOTE — Telephone Encounter (Signed)
Routing to provider. She has appt on 09/04/16.

## 2016-08-28 ENCOUNTER — Other Ambulatory Visit: Payer: Self-pay | Admitting: Unknown Physician Specialty

## 2016-08-28 NOTE — Telephone Encounter (Signed)
Routing to provider  

## 2016-08-28 NOTE — Telephone Encounter (Signed)
Patient has an appt with Elnita Maxwellheryl on  1/3 but needs a refill on one of her medications and is wanting to know if this can be refilled prior to her visit.  Medication needed is losartan 100mg     [facility] Phar  Daughter can be reached at [phone]

## 2016-08-29 ENCOUNTER — Telehealth: Payer: Self-pay | Admitting: Unknown Physician Specialty

## 2016-08-29 MED ORDER — LOSARTAN POTASSIUM 100 MG PO TABS: 100.0000 mg | ORAL_TABLET | Freq: Every day | ORAL | 1 refills | Status: DC

## 2016-09-04 ENCOUNTER — Ambulatory Visit
Admission: RE | Admit: 2016-09-04 | Discharge: 2016-09-04 | Disposition: A | Discharge status: Home / Self Care | Payer: Medicare Other | Source: Ambulatory Visit | Attending: Unknown Physician Specialty | Admitting: Unknown Physician Specialty

## 2016-09-04 ENCOUNTER — Encounter: Payer: Self-pay | Admitting: Unknown Physician Specialty

## 2016-09-04 ENCOUNTER — Ambulatory Visit: Admission: RE | Admit: 2016-09-04 | Payer: Medicare Other | Source: Ambulatory Visit | Admitting: *Deleted

## 2016-09-04 ENCOUNTER — Ambulatory Visit (INDEPENDENT_AMBULATORY_CARE_PROVIDER_SITE_OTHER): Payer: Medicare Other | Admitting: Unknown Physician Specialty

## 2016-09-04 VITALS — BP 125/79 | HR 80 | Temp 97.5°F | Wt 108.4 lb

## 2016-09-04 DIAGNOSIS — I739 Peripheral vascular disease, unspecified: Secondary | ICD-10-CM | POA: Diagnosis not present

## 2016-09-04 DIAGNOSIS — Z9181 History of falling: Secondary | ICD-10-CM | POA: Diagnosis not present

## 2016-09-04 DIAGNOSIS — R4189 Other symptoms and signs involving cognitive functions and awareness: Secondary | ICD-10-CM

## 2016-09-04 DIAGNOSIS — M545 Low back pain: Secondary | ICD-10-CM | POA: Diagnosis present

## 2016-09-04 DIAGNOSIS — M8588 Other specified disorders of bone density and structure, other site: Secondary | ICD-10-CM | POA: Diagnosis not present

## 2016-09-04 DIAGNOSIS — I10 Essential (primary) hypertension: Secondary | ICD-10-CM

## 2016-09-04 DIAGNOSIS — N183 Chronic kidney disease, stage 3 unspecified: Secondary | ICD-10-CM

## 2016-09-04 DIAGNOSIS — R636 Underweight: Secondary | ICD-10-CM

## 2016-09-04 DIAGNOSIS — G8929 Other chronic pain: Secondary | ICD-10-CM | POA: Diagnosis not present

## 2016-09-04 NOTE — Assessment & Plan Note (Signed)
BP is stable.  Reluctant to treat aggressively as she lives alnone

## 2016-09-04 NOTE — Assessment & Plan Note (Signed)
Refer to neurology

## 2016-09-04 NOTE — Assessment & Plan Note (Signed)
CMP and CBC today

## 2016-09-04 NOTE — Progress Notes (Signed)
BP 125/79 (BP Location: Left Arm, Cuff Size: Normal)   Pulse 80   Temp 97.5 F (36.4 C)   Wt 108 lb 6.4 oz (49.2 kg)   LMP  (LMP Unknown)   SpO2 97%   BMI 19.45 kg/m    Subjective:    Patient ID: Paula Thomas, female    DOB: 08/09/1932, 83 y.o.   MRN: [id number]  HPI: Paula Thomas is a 83 y.o. female  Chief Complaint  Patient presents with  . Hypertension  . Pain    pt states her back and legs have been hurting since October. States her legs hurt more than her back and the pain usually comes after walking for a while. Patient's son stated that patient's mom had back pain before she passed, had some blood tests run and found out she had cancer.    Pt is here with her son Jorja Loa.    Back and leg pain Pt has had back and leg pain since October.  States she has to quit walking due to lower leg pain but also says back hurts.  Pain is better after sitting.  She did smoke many years ago.   Congnitive impairment Started on Donepezil but had significant effects.   Her son states they have some help coming in 3 days/week to help out.  She has not yet had a neurology visit.    Hypertension Using medications without difficulty Average home BPs Not checking   No problems or lightheadedness No chest pain with exertion or shortness of breath No Edema Noted a low GFR back in August that needs f/u.    Relevant past medical, surgical, family and social history reviewed and updated as indicated. Interim medical history since our last visit reviewed. Allergies and medications reviewed and updated.  Review of Systems  Per HPI unless specifically indicated above     Objective:    BP 125/79 (BP Location: Left Arm, Cuff Size: Normal)   Pulse 80   Temp 97.5 F (36.4 C)   Wt 108 lb 6.4 oz (49.2 kg)   LMP  (LMP Unknown)   SpO2 97%   BMI 19.45 kg/m   Wt Readings from Last 3 Encounters:  09/04/16 108 lb 6.4 oz (49.2 kg)  06/11/16 104 lb 12.8 oz (47.5 kg)  05/28/16 110 lb  (49.9 kg)    Physical Exam  Constitutional: She is oriented to person, place, and time. She appears well-developed and well-nourished. No distress.  HENT:  Head: Normocephalic and atraumatic.  Eyes: Conjunctivae and lids are normal. Right eye exhibits no discharge. Left eye exhibits no discharge. No scleral icterus.  Neck: Normal range of motion. Neck supple. No JVD present. Carotid bruit is not present.  Cardiovascular: Normal rate, regular rhythm and normal heart sounds.   Pulmonary/Chest: Effort normal and breath sounds normal.  Abdominal: Normal appearance. There is no splenomegaly or hepatomegaly.  Musculoskeletal: Normal range of motion.  Neurological: She is alert and oriented to person, place, and time.  Skin: Skin is warm, dry and intact. No rash noted. No pallor.  Psychiatric: She has a normal mood and affect. Her behavior is normal. Judgment and thought content normal.    Results for orders placed or performed during the hospital encounter of 05/02/16  Basic metabolic panel  Result Value Ref Range   Sodium 133 (L) 135 - 145 mmol/L   Potassium 3.7 3.5 - 5.1 mmol/L   Chloride 97 (L) 101 - 111 mmol/L   CO2 26  22 - 32 mmol/L   Glucose, Bld 112 (H) 65 - 99 mg/dL   BUN 17 6 - 20 mg/dL   Creatinine, Ser 1.611.27 (H) 0.44 - 1.00 mg/dL   Calcium 8.8 (L) 8.9 - 10.3 mg/dL   GFR calc non Af Amer 38 (L) >60 mL/min   GFR calc Af Amer 45 (L) >60 mL/min   Anion gap 10 5 - 15  CBC  Result Value Ref Range   WBC 8.5 3.6 - 11.0 K/uL   RBC 3.88 3.80 - 5.20 MIL/uL   Hemoglobin 12.2 12.0 - 16.0 g/dL   HCT 09.635.4 04.535.0 - 40.947.0 %   MCV 91.2 80.0 - 100.0 fL   MCH 31.6 26.0 - 34.0 pg   MCHC 34.6 32.0 - 36.0 g/dL   RDW 81.113.3 91.411.5 - 78.214.5 %   Platelets 304 150 - 440 K/uL      Assessment & Plan:   Problem List Items Addressed This Visit      Unprioritized   CKD (chronic kidney disease), stage III    CMP and CBC today      Relevant Orders   Comprehensive metabolic panel   CBC with  Differential/Platelet   VITAMIN D 25 Hydroxy (Vit-D Deficiency, Fractures)   Cognitive impairment    Refer to neurology      Relevant Orders   Ambulatory referral to Neurology   Hypertension    BP is stable.  Reluctant to treat aggressively as she lives alnone      Risk for falls   Underweight   Relevant Orders   TSH    Other Visit Diagnoses    Claudication (HCC)    -  Primary   Rfer to vascular.  Suspect Musculoskeletal related.  PT if labs, x-ray and soncult normal consider home PT   Relevant Orders   Ambulatory referral to Vascular Surgery   Chronic midline low back pain without sciatica       Relevant Orders   DG Lumbar Spine Complete      Follow up plan: Return in about 6 months (around 03/04/2017).

## 2016-09-05 LAB — COMPREHENSIVE METABOLIC PANEL
ALK PHOS: 86 IU/L (ref 39–117)
ALT: 15 IU/L (ref 0–32)
AST: 20 IU/L (ref 0–40)
Albumin/Globulin Ratio: 1.6 (ref 1.2–2.2)
Albumin: 4.3 g/dL (ref 3.5–4.7)
BUN/Creatinine Ratio: 14 (ref 12–28)
BUN: 14 mg/dL (ref 8–27)
Bilirubin Total: 0.6 mg/dL (ref 0.0–1.2)
CO2: 27 mmol/L (ref 18–29)
CREATININE: 0.98 mg/dL (ref 0.57–1.00)
Calcium: 9.6 mg/dL (ref 8.7–10.3)
Chloride: 95 mmol/L — ABNORMAL LOW (ref 96–106)
GFR calc Af Amer: 62 mL/min/{1.73_m2} (ref >59)
GFR calc non Af Amer: 54 mL/min/{1.73_m2} — ABNORMAL LOW (ref >59)
GLUCOSE: 84 mg/dL (ref 65–99)
Globulin, Total: 2.7 g/dL (ref 1.5–4.5)
Potassium: 4.5 mmol/L (ref 3.5–5.2)
SODIUM: 137 mmol/L (ref 134–144)
Total Protein: 7 g/dL (ref 6.0–8.5)

## 2016-09-05 LAB — CBC WITH DIFFERENTIAL/PLATELET
Basophils Absolute: 0 10*3/uL (ref 0.0–0.2)
Basos: 0 %
EOS (ABSOLUTE): 0.1 10*3/uL (ref 0.0–0.4)
Eos: 1 %
HEMATOCRIT: 38.9 % (ref 34.0–46.6)
Hemoglobin: 13 g/dL (ref 11.1–15.9)
IMMATURE GRANULOCYTES: 1 %
Immature Grans (Abs): 0.1 10*3/uL (ref 0.0–0.1)
LYMPHS: 16 %
Lymphocytes Absolute: 1.7 10*3/uL (ref 0.7–3.1)
MCH: 31.4 pg (ref 26.6–33.0)
MCHC: 33.4 g/dL (ref 31.5–35.7)
MCV: 94 fL (ref 79–97)
MONOCYTES: 9 %
MONOS ABS: 1 10*3/uL — AB (ref 0.1–0.9)
NEUTROS PCT: 73 %
Neutrophils Absolute: 8 10*3/uL — ABNORMAL HIGH (ref 1.4–7.0)
Platelets: 440 10*3/uL — ABNORMAL HIGH (ref 150–379)
RBC: 4.14 x10E6/uL (ref 3.77–5.28)
RDW: 13.3 % (ref 12.3–15.4)
WBC: 10.8 10*3/uL (ref 3.4–10.8)

## 2016-09-05 LAB — TSH: TSH: 2.58 u[IU]/mL (ref 0.450–4.500)

## 2016-09-05 LAB — VITAMIN D 25 HYDROXY (VIT D DEFICIENCY, FRACTURES): Vit D, 25-Hydroxy: 25.8 ng/mL — ABNORMAL LOW (ref 30.0–100.0)

## 2016-09-06 ENCOUNTER — Encounter: Payer: Self-pay | Admitting: Unknown Physician Specialty

## 2016-09-10 NOTE — Telephone Encounter (Signed)
ERROR

## 2016-10-09 ENCOUNTER — Telehealth: Payer: Self-pay | Admitting: Unknown Physician Specialty

## 2016-10-09 NOTE — Telephone Encounter (Signed)
Patient's daughter returned my call. She stated that Dr. Malvin JohnsPotter started her mom on 5 mg of this medication but she thinks that this medication gave her mom significant side effects. She wants to know if the patient should be on it, if she should call Dr. Malvin JohnsPotter, and if her mom should take the medication this evening.

## 2016-10-09 NOTE — Telephone Encounter (Signed)
Called and left patient's daughter a VM asking for her to please return my call.  °

## 2016-10-09 NOTE — Telephone Encounter (Signed)
Renate, pts daughter, called and stated that the pt was given donepezil 5mg  by Dr Malvin JohnsPotter and she would like to speak with PCP about this medication.

## 2016-10-09 NOTE — Telephone Encounter (Signed)
Discussed with daughter who states Donepezil causes falls and dizzyness.  Will discontinue.  Discussed issues with not taking Donepezil.

## 2016-10-13 ENCOUNTER — Emergency Department: Payer: Medicare Other

## 2016-10-13 ENCOUNTER — Emergency Department
Admission: EM | Admit: 2016-10-13 | Discharge: 2016-10-13 | Disposition: A | Discharge status: Home / Self Care | Payer: Medicare Other | Attending: Emergency Medicine | Admitting: Emergency Medicine

## 2016-10-13 ENCOUNTER — Encounter: Payer: Self-pay | Admitting: Emergency Medicine

## 2016-10-13 DIAGNOSIS — W19XXXA Unspecified fall, initial encounter: Secondary | ICD-10-CM | POA: Insufficient documentation

## 2016-10-13 DIAGNOSIS — I129 Hypertensive chronic kidney disease with stage 1 through stage 4 chronic kidney disease, or unspecified chronic kidney disease: Secondary | ICD-10-CM | POA: Diagnosis not present

## 2016-10-13 DIAGNOSIS — Y939 Activity, unspecified: Secondary | ICD-10-CM | POA: Insufficient documentation

## 2016-10-13 DIAGNOSIS — Y999 Unspecified external cause status: Secondary | ICD-10-CM | POA: Diagnosis not present

## 2016-10-13 DIAGNOSIS — Z79899 Other long term (current) drug therapy: Secondary | ICD-10-CM | POA: Insufficient documentation

## 2016-10-13 DIAGNOSIS — S0101XA Laceration without foreign body of scalp, initial encounter: Secondary | ICD-10-CM | POA: Diagnosis not present

## 2016-10-13 DIAGNOSIS — N183 Chronic kidney disease, stage 3 (moderate): Secondary | ICD-10-CM | POA: Diagnosis not present

## 2016-10-13 DIAGNOSIS — Y92009 Unspecified place in unspecified non-institutional (private) residence as the place of occurrence of the external cause: Secondary | ICD-10-CM | POA: Insufficient documentation

## 2016-10-13 DIAGNOSIS — Z87891 Personal history of nicotine dependence: Secondary | ICD-10-CM | POA: Diagnosis not present

## 2016-10-13 DIAGNOSIS — S0990XA Unspecified injury of head, initial encounter: Secondary | ICD-10-CM | POA: Diagnosis present

## 2016-10-13 DIAGNOSIS — R55 Syncope and collapse: Secondary | ICD-10-CM

## 2016-10-13 LAB — CBC
HCT: 39.3 % (ref 35.0–47.0)
Hemoglobin: 13.5 g/dL (ref 12.0–16.0)
MCH: 32 pg (ref 26.0–34.0)
MCHC: 34.4 g/dL (ref 32.0–36.0)
MCV: 93 fL (ref 80.0–100.0)
Platelets: 352 10*3/uL (ref 150–440)
RBC: 4.23 MIL/uL (ref 3.80–5.20)
RDW: 12.6 % (ref 11.5–14.5)
WBC: 9.8 10*3/uL (ref 3.6–11.0)

## 2016-10-13 LAB — BASIC METABOLIC PANEL
ANION GAP: 11 (ref 5–15)
BUN: 20 mg/dL (ref 6–20)
CO2: 29 mmol/L (ref 22–32)
Calcium: 9.4 mg/dL (ref 8.9–10.3)
Chloride: 97 mmol/L — ABNORMAL LOW (ref 101–111)
Creatinine, Ser: 1.37 mg/dL — ABNORMAL HIGH (ref 0.44–1.00)
GFR calc non Af Amer: 35 mL/min — ABNORMAL LOW (ref >60)
GFR, EST AFRICAN AMERICAN: 40 mL/min — AB (ref >60)
GLUCOSE: 128 mg/dL — AB (ref 65–99)
Potassium: 3.6 mmol/L (ref 3.5–5.1)
Sodium: 137 mmol/L (ref 135–145)

## 2016-10-13 LAB — GLUCOSE, CAPILLARY: Glucose-Capillary: 95 mg/dL (ref 65–99)

## 2016-10-13 MED ORDER — LIDOCAINE-EPINEPHRINE (PF) 2 %-1:200000 IJ SOLN
20.0000 mL | Freq: Once | INTRAMUSCULAR | Status: AC
  Administered 2016-10-13: 20 mL
  Filled 2016-10-13: qty 20

## 2016-10-13 MED ORDER — TETANUS-DIPHTH-ACELL PERTUSSIS 5-2.5-18.5 LF-MCG/0.5 IM SUSP
0.5000 mL | Freq: Once | INTRAMUSCULAR | Status: DC
Start: 2016-10-13 — End: 2016-10-13
  Filled 2016-10-13: qty 0.5

## 2016-10-13 NOTE — ED Provider Notes (Signed)
[facility] Emergency Department Provider Note  ____________________________________________  Time seen: Approximately 8:05 PM  I have reviewed the triage vital signs and the nursing notes.   HISTORY  Chief Complaint Fall and Loss of Consciousness    HPI Paula Thomas is a 83 y.o. female who complains of bleeding from the back of the head after passing out at home. She states she was in her usual state of health, and then this morning she was in one room and got up and started walking to another area of the house. She then became dizzy and passed out.  He denies any preceding symptoms such as headache vision change numbness tingling weakness chest pain shortness of breath abdominal pain or back pain. She has not had any of these symptoms since waking up from the episode either. She does report that she has had recurrent episodes of syncope over the last several months which seemed to be increasing in frequency. She is compliant with her medications. Eating and drinking normally.     Past Medical History:  Diagnosis Date  . Atrial fibrillation (HCC)   . Fatigue   . Hypertension   . Syncope   . Underweight      Patient Active Problem List   Diagnosis Date Noted  . Cognitive impairment 06/11/2016  . Wart 05/28/2016  . Sciatica 04/14/2015  . Weakness generalized 04/14/2015  . Risk for falls 04/14/2015  . Atrial fibrillation (HCC) 03/07/2015  . Syncope 03/07/2015  . Underweight 03/07/2015  . Fatigue 03/07/2015  . Hypertension 03/07/2015  . Osteoarthritis of left knee 03/07/2015  . Benign hypertension with CKD (chronic kidney disease) stage I 03/07/2015  . CKD (chronic kidney disease), stage III 03/07/2015     Past Surgical History:  Procedure Laterality Date  . ABDOMINAL HYSTERECTOMY    . EYE SURGERY Bilateral 2012   cataracts  . HERNIA REPAIR    . SPLENECTOMY    . TONSILLECTOMY       Prior to Admission medications   Medication Sig  Start Date End Date Taking? Authorizing Provider  donepezil (ARICEPT) 5 MG tablet Take 5 mg by mouth at bedtime. 10/08/16 11/07/16 Yes Historical Provider, MD  apixaban (ELIQUIS) 2.5 MG TABS tablet Take 2.5 mg by mouth 2 (two) times daily. 03/07/16   Historical Provider, MD  diltiazem (CARDIZEM CD) 240 MG 24 hr capsule Take 240 mg by mouth daily. 03/07/16   Historical Provider, MD  hydrochlorothiazide (HYDRODIURIL) 25 MG tablet Take 1 tablet (25 mg total) by mouth daily. 07/29/16   Gabriel Cirriheryl Wicker, NP  losartan (COZAAR) 100 MG tablet Take 1 tablet (100 mg total) by mouth daily. 08/29/16   Megan P Johnson, DO  Multiple Vitamin (MULTI-VITAMINS) TABS Take by mouth.    Historical Provider, MD     Allergies Lisinopril; Penicillin g potassium [penicillin g]; and Sulfa antibiotics   Family History  Problem Relation Age of Onset  . Cancer Mother   . Cancer Father   . Heart attack Brother   . Heart attack Brother     Social History Social History  Substance Use Topics  . Smoking status: Former Games developermoker  . Smokeless tobacco: Never Used  . Alcohol use No    Review of Systems  Constitutional:   No fever or chills.  ENT:   No sore throat. No rhinorrhea. Cardiovascular:   No chest pain. Respiratory:   No dyspnea or cough. Gastrointestinal:   Negative for abdominal pain, vomiting and diarrhea.  Genitourinary:  Negative for dysuria or difficulty urinating. Musculoskeletal:   Negative for focal pain or swelling Neurological:   Positive for posterior headache after the fall In the area of her wound. 10-point ROS otherwise negative.  ____________________________________________   PHYSICAL EXAM:  VITAL SIGNS: ED Triage Vitals [10/13/16 1508]  Enc Vitals Group     BP (!) 154/86     Pulse Rate (!) 105     Resp 18     Temp 97.9 F (36.6 C)     Temp Source Oral     SpO2 96 %     Weight 120 lb (54.4 kg)     Height 5\' 4"  (1.626 m)     Head Circumference      Peak Flow      Pain Score 10      Pain Loc      Pain Edu?      Excl. in GC?     Vital signs reviewed, nursing assessments reviewed.   Constitutional:   Alert and oriented. Well appearing and in no distress. Eyes:   No scleral icterus. No conjunctival pallor. PERRL. EOMI.  No nystagmus. ENT   Head:   Normocephalic With 6 cm linear laceration over the occiput. There is an area of pulsatile bleeding from within the wound.   Nose:   No congestion/rhinnorhea. No septal hematoma   Mouth/Throat:   MMM, no pharyngeal erythema. No peritonsillar mass.    Neck:   No stridor. No SubQ emphysema. No meningismus.No midline tenderness. Full range of motion. Hematological/Lymphatic/Immunilogical:   No cervical lymphadenopathy. Cardiovascular:   RRR. Symmetric bilateral radial and DP pulses.  No murmurs.  Respiratory:   Normal respiratory effort without tachypnea nor retractions. Breath sounds are clear and equal bilaterally. No wheezes/rales/rhonchi. Gastrointestinal:   Soft and nontender. Non distended. There is no CVA tenderness.  No rebound, rigidity, or guarding. Genitourinary:   deferred Musculoskeletal:   Nontender with normal range of motion in all extremities. No joint effusions.  No lower extremity tenderness.  No edema. Neurologic:   Normal speech and language.  CN 2-10 normal. Motor grossly intact. No gross focal neurologic deficits are appreciated.  Skin:    Skin is warm, dry and intact. No rash noted.  No petechiae, purpura, or bullae.  ____________________________________________    LABS (pertinent positives/negatives) (all labs ordered are listed, but only abnormal results are displayed) Labs Reviewed  BASIC METABOLIC PANEL - Abnormal; Notable for the following:       Result Value   Chloride 97 (*)    Glucose, Bld 128 (*)    Creatinine, Ser 1.37 (*)    GFR calc non Af Amer 35 (*)    GFR calc Af Amer 40 (*)    All other components within normal limits  CBC  GLUCOSE, CAPILLARY  URINALYSIS, COMPLETE  (UACMP) WITH MICROSCOPIC  CBG MONITORING, ED   ____________________________________________   EKG  Interpreted by me Atrial fibrillation rate 103, normal axis intervals QRS ST segments and T waves  ____________________________________________    RADIOLOGY  CT head unremarkable  ____________________________________________   PROCEDURES Procedures LACERATION REPAIR Performed by: Scotty Court, Deyana Wnuk Authorized by: Sharman Cheek Consent: Verbal consent obtained. Risks and benefits: risks, benefits and alternatives were discussed Consent given by: patient Patient identity confirmed: provided demographic data Prepped and Draped in normal sterile fashion Wound explored  Laceration Location: Posterior scalp  Laceration Length: 6cm  No Foreign Bodies seen or palpated  Anesthesia: local infiltration  Local anesthetic: lidocaine 1% without epinephrine  Anesthetic total: 5 ml  Irrigation method: syringe Amount of cleaning: standard  1 deep 4-0 Monocryl suture placed through subcutaneous tissues around the focus of pulsatile bleeding which improved wound approximation and achieved hemostasis.  Skin closure: 4-0 Monocryl   Number of sutures: 4  Technique: Simple interrupted   Patient tolerance: Patient tolerated the procedure well with no immediate complications.  ____________________________________________   INITIAL IMPRESSION / ASSESSMENT AND PLAN / ED COURSE  Pertinent labs & imaging results that were available during my care of the patient were reviewed by me and considered in my medical decision making (see chart for details).  Patient presents with scalp laceration after syncope at home. Singulair likely orthostatic due to getting up quickly and walking to a different part of the house. No preceding symptoms or symptoms afterward to raise suspicion. Vital signs unremarkable here. CT negative. Wound repaired. Tetanus up-to-date per patient and her daughter.  Son will stay with the patient tonight to monitor symptoms. Return precautions given. Likely stable and suitable for discharge and outpatient follow-up.       ____________________________________________   FINAL CLINICAL IMPRESSION(S) / ED DIAGNOSES  Final diagnoses:  Syncope and collapse  Scalp laceration, initial encounter      New Prescriptions   No medications on file     Portions of this note were generated with dragon dictation software. Dictation errors may occur despite best attempts at proofreading.    Sharman Cheek, MD 10/13/16 2012

## 2016-10-13 NOTE — Discharge Instructions (Signed)
You had 4 stiches placed in the back of the head to close the wound.  These will absorb and fall out on their own.  Do not scrub the area for 1 week.

## 2016-10-13 NOTE — ED Triage Notes (Signed)
Pt comes into the ED via POV c/o fall that occurred earlier today.  Patient had LOC and presents with a laceration to the back of the head. Patient is alert and oriented x4.  Denies dizziness prior to the fall or tripping on anything.  Patient denies any blood thinners. Patient is neurologically intact at this time.

## 2016-10-14 ENCOUNTER — Telehealth: Payer: Self-pay | Admitting: Unknown Physician Specialty

## 2016-10-14 DIAGNOSIS — R55 Syncope and collapse: Secondary | ICD-10-CM

## 2016-10-14 NOTE — Telephone Encounter (Signed)
Called and let patient's son, Paula Thomas, know what Elnita MaxwellCheryl said (son on [place]). He stated that his mother has an appointment scheduled with us on Monday.

## 2016-10-14 NOTE — Telephone Encounter (Signed)
Peggy with [facility] called and would like to know if she could have orders for the pt to have 24 hour care for at least 3 days due to the pts inability to stand on her own.

## 2016-10-14 NOTE — Telephone Encounter (Signed)
Routing to provider  

## 2016-10-14 NOTE — Telephone Encounter (Signed)
Thanks.  They will need to let me know if she has a persistent or worsening headache and mental status.

## 2016-10-14 NOTE — Telephone Encounter (Signed)
Routing to provider, FYI.  

## 2016-10-15 NOTE — Telephone Encounter (Signed)
Called and left Peggy a VM asking for her to please return my call.

## 2016-10-15 NOTE — Telephone Encounter (Signed)
That would be fine. I put in a home health referral but medicare may not pay

## 2016-10-16 ENCOUNTER — Telehealth: Payer: Self-pay | Admitting: Unknown Physician Specialty

## 2016-10-16 NOTE — Telephone Encounter (Signed)
Peggy with [facility] returned my call. I let her know what Elnita MaxwellCheryl said. I asked Gigi Gineggy what kind of services they provide and she stated that they provide unskilled care and make sure patient's are safe at home. I asked if the patient's insurance would cover these 24 hour visits. Gigi Gineggy stated that she does not deal with the insurance part of it. She did state that it goes through long term care insurance and that the family is aware and in agreement.

## 2016-10-16 NOTE — Telephone Encounter (Signed)
Yes it is, thanks.  I already put in for a home health referral

## 2016-10-16 NOTE — Telephone Encounter (Signed)
Called and left Peggy a VM asking for her to please return my call.

## 2016-10-16 NOTE — Telephone Encounter (Signed)
Referral faxed to [facility]..

## 2016-10-16 NOTE — Telephone Encounter (Signed)
Called and left Paula Thomas a VM asking for her to please return my call.  

## 2016-10-16 NOTE — Telephone Encounter (Signed)
Peggy returned my call. She stated that she was calling to ask if we could order some in home PT for the patient. She stated that the place she works with does not do this but stated that the patient is having trouble standing on her own. Elnita MaxwellCheryl, is it OK to order in home PT for the patient?

## 2016-10-21 ENCOUNTER — Encounter: Payer: Self-pay | Admitting: Unknown Physician Specialty

## 2016-10-21 ENCOUNTER — Ambulatory Visit (INDEPENDENT_AMBULATORY_CARE_PROVIDER_SITE_OTHER): Payer: Medicare Other | Admitting: Unknown Physician Specialty

## 2016-10-21 VITALS — BP 119/72 | HR 65 | Temp 97.9°F | Wt 109.2 lb

## 2016-10-21 DIAGNOSIS — R4189 Other symptoms and signs involving cognitive functions and awareness: Secondary | ICD-10-CM | POA: Diagnosis not present

## 2016-10-21 DIAGNOSIS — I10 Essential (primary) hypertension: Secondary | ICD-10-CM | POA: Diagnosis not present

## 2016-10-21 DIAGNOSIS — Z9181 History of falling: Secondary | ICD-10-CM | POA: Diagnosis not present

## 2016-10-21 DIAGNOSIS — Z23 Encounter for immunization: Secondary | ICD-10-CM | POA: Diagnosis not present

## 2016-10-21 NOTE — Progress Notes (Signed)
BP 119/72 (BP Location: Left Arm, Cuff Size: Small)   Pulse 65   Temp 97.9 F (36.6 C)   Wt 109 lb 3.2 oz (49.5 kg)   LMP  (LMP Unknown)   SpO2 93%   BMI 18.74 kg/m    Subjective:    Patient ID: Paula Thomas, female    DOB: [birth year], 83 y.o.   MRN: [id number]  HPI: Paula Thomas is a 83 y.o. female  Chief Complaint  Patient presents with  . ER Follow Up    pt states she had a fall and went to the ER, states she cut her head    Fall Pt is here with her son/  She was in the ER about 8 days ago after falling and getting stitches.  No increased problems with mental status.  Head is still sore where she had dissolvable stitches.  Home PT is coming.  She has 24 hour aides but son would like a Child psychotherapistsocial worker to help them determine ability to stay home alone.  No orthostatic changes.    She is still weak and difficult to walk  Dementia Unable to tolerate Aricept and this was discontinued  Relevant past medical, surgical, family and social history reviewed and updated as indicated. Interim medical history since our last visit reviewed. Allergies and medications reviewed and updated.  Review of Systems  Per HPI unless specifically indicated above     Objective:    BP 119/72 (BP Location: Left Arm, Cuff Size: Small)   Pulse 65   Temp 97.9 F (36.6 C)   Wt 109 lb 3.2 oz (49.5 kg)   LMP  (LMP Unknown)   SpO2 93%   BMI 18.74 kg/m   Wt Readings from Last 3 Encounters:  10/21/16 109 lb 3.2 oz (49.5 kg)  10/13/16 120 lb (54.4 kg)  09/04/16 108 lb 6.4 oz (49.2 kg)    Physical Exam  Constitutional: She is oriented to person, place, and time. She appears well-developed and well-nourished. No distress.  HENT:  Head: Normocephalic and atraumatic.  Eyes: Conjunctivae and lids are normal. Right eye exhibits no discharge. Left eye exhibits no discharge. No scleral icterus.  Neck: Normal range of motion. Neck supple. No JVD present. Carotid bruit is not present.    Cardiovascular: Normal rate, regular rhythm and normal heart sounds.   Pulmonary/Chest: Effort normal and breath sounds normal.  Abdominal: Normal appearance. There is no splenomegaly or hepatomegaly.  Musculoskeletal: Normal range of motion.  Neurological: She is alert and oriented to person, place, and time.  Skin: Skin is warm, dry and intact. No rash noted. No pallor.  Head wound healing well  Psychiatric: She has a normal mood and affect. Her behavior is normal. Judgment and thought content normal.     Assessment & Plan:   Problem List Items Addressed This Visit      Unprioritized   Cognitive impairment    Pt and family education      Hypertension    Stable, continue present medications.        Risk for falls    Ask for social work evaluation.  Getting home PT.  Home care providers.          Other Visit Diagnoses    Need for influenza vaccination    -  Primary   Relevant Orders   Flu vaccine HIGH DOSE PF (Completed)       Follow up plan: Return in about 3 months (around 01/18/2017).

## 2016-10-21 NOTE — Assessment & Plan Note (Signed)
Pt and family education

## 2016-10-21 NOTE — Patient Instructions (Addendum)

## 2016-10-21 NOTE — Assessment & Plan Note (Addendum)
Ask for social work evaluation.  Getting home PT.  Home care providers.

## 2016-10-21 NOTE — Assessment & Plan Note (Signed)
Stable, continue present medications.   

## 2016-10-24 ENCOUNTER — Telehealth: Payer: Self-pay | Admitting: Unknown Physician Specialty

## 2016-10-24 NOTE — Telephone Encounter (Signed)
Erin with [facility] called because the patient is having swelling in both legs, patient has TED hose they need to know if ok to put on the patient.  Please advise.  Thanks  [phone]

## 2016-10-25 NOTE — Telephone Encounter (Signed)
Called Erin left VM

## 2016-10-25 NOTE — Telephone Encounter (Signed)
Yes, it is OK

## 2016-10-25 NOTE — Telephone Encounter (Signed)
Routing to provider  

## 2016-11-04 ENCOUNTER — Encounter: Payer: Self-pay | Admitting: Unknown Physician Specialty

## 2016-11-04 ENCOUNTER — Ambulatory Visit (INDEPENDENT_AMBULATORY_CARE_PROVIDER_SITE_OTHER): Payer: Medicare Other | Admitting: Unknown Physician Specialty

## 2016-11-04 VITALS — BP 142/77 | HR 88 | Temp 98.2°F | Wt 108.2 lb

## 2016-11-04 DIAGNOSIS — R634 Abnormal weight loss: Secondary | ICD-10-CM | POA: Diagnosis not present

## 2016-11-04 DIAGNOSIS — Z4802 Encounter for removal of sutures: Secondary | ICD-10-CM

## 2016-11-04 NOTE — Progress Notes (Signed)
BP (!) 142/77 (BP Location: Left Arm, Cuff Size: Small)   Pulse 88   Temp 98.2 F (36.8 C)   Wt 108 lb 3.2 oz (49.1 kg)   LMP  (LMP Unknown)   SpO2 99%   BMI 18.57 kg/m    Subjective:    Patient ID: Paula Thomas, female    DOB: 09-04-1931, 83 y.o.   MRN: [id number]  HPI: Paula Thomas is a 83 y.o. female  Chief Complaint  Patient presents with  . Suture / Staple Removal    pt here to have stitches removed from scalp   Suture removal Pt with about 4 sutures that have not dissolved.  These are itching.    Weight loss With with 12 pound weight loss in the last month.  She does have dementia and needs to be encouraged to eat.    Relevant past medical, surgical, family and social history reviewed and updated as indicated. Interim medical history since our last visit reviewed. Allergies and medications reviewed and updated.  Review of Systems  Per HPI unless specifically indicated above     Objective:    BP (!) 142/77 (BP Location: Left Arm, Cuff Size: Small)   Pulse 88   Temp 98.2 F (36.8 C)   Wt 108 lb 3.2 oz (49.1 kg)   LMP  (LMP Unknown)   SpO2 99%   BMI 18.57 kg/m   Wt Readings from Last 3 Encounters:  11/04/16 108 lb 3.2 oz (49.1 kg)  10/21/16 109 lb 3.2 oz (49.5 kg)  10/13/16 120 lb (54.4 kg)    Physical Exam  Constitutional: She is oriented to person, place, and time. She appears well-developed and well-nourished. No distress.  HENT:  Head: Normocephalic and atraumatic.  Eyes: Conjunctivae and lids are normal. Right eye exhibits no discharge. Left eye exhibits no discharge. No scleral icterus.  Cardiovascular: Normal rate.   Pulmonary/Chest: Effort normal.  Abdominal: Normal appearance. There is no splenomegaly or hepatomegaly.  Musculoskeletal: Normal range of motion.  Neurological: She is alert and oriented to person, place, and time.  Skin: Skin is intact. No rash noted. No pallor.  4 sutures removed from incision in head  Psychiatric:  She has a normal mood and affect. Her behavior is normal. Judgment and thought content normal.    Results for orders placed or performed during the hospital encounter of 10/13/16  Basic metabolic panel  Result Value Ref Range   Sodium 137 135 - 145 mmol/L   Potassium 3.6 3.5 - 5.1 mmol/L   Chloride 97 (L) 101 - 111 mmol/L   CO2 29 22 - 32 mmol/L   Glucose, Bld 128 (H) 65 - 99 mg/dL   BUN 20 6 - 20 mg/dL   Creatinine, Ser 4.09 (H) 0.44 - 1.00 mg/dL   Calcium 9.4 8.9 - 81.1 mg/dL   GFR calc non Af Amer 35 (L) >60 mL/min   GFR calc Af Amer 40 (L) >60 mL/min   Anion gap 11 5 - 15  CBC  Result Value Ref Range   WBC 9.8 3.6 - 11.0 K/uL   RBC 4.23 3.80 - 5.20 MIL/uL   Hemoglobin 13.5 12.0 - 16.0 g/dL   HCT 91.4 78.2 - 95.6 %   MCV 93.0 80.0 - 100.0 fL   MCH 32.0 26.0 - 34.0 pg   MCHC 34.4 32.0 - 36.0 g/dL   RDW 21.3 08.6 - 57.8 %   Platelets 352 150 - 440 K/uL  Glucose, capillary  Result Value Ref Range   Glucose-Capillary 95 65 - 99 mg/dL      Assessment & Plan:   Problem List Items Addressed This Visit    None    Visit Diagnoses    Weight loss    -  Primary   Weight loss.  I suspect the hospital weight one month ago is an outlier.  Will encourage increasing food.     Visit for suture removal           Follow up plan: Return if symptoms worsen or fail to improve.

## 2016-11-06 ENCOUNTER — Emergency Department: Payer: Medicare Other

## 2016-11-06 ENCOUNTER — Encounter: Payer: Self-pay | Admitting: Emergency Medicine

## 2016-11-06 ENCOUNTER — Emergency Department
Admission: EM | Admit: 2016-11-06 | Discharge: 2016-11-06 | Disposition: A | Discharge status: Home / Self Care | Payer: Medicare Other | Attending: Emergency Medicine | Admitting: Emergency Medicine

## 2016-11-06 DIAGNOSIS — R55 Syncope and collapse: Secondary | ICD-10-CM | POA: Diagnosis not present

## 2016-11-06 DIAGNOSIS — Z87891 Personal history of nicotine dependence: Secondary | ICD-10-CM | POA: Insufficient documentation

## 2016-11-06 DIAGNOSIS — Z79899 Other long term (current) drug therapy: Secondary | ICD-10-CM | POA: Diagnosis not present

## 2016-11-06 DIAGNOSIS — I129 Hypertensive chronic kidney disease with stage 1 through stage 4 chronic kidney disease, or unspecified chronic kidney disease: Secondary | ICD-10-CM | POA: Diagnosis not present

## 2016-11-06 DIAGNOSIS — N183 Chronic kidney disease, stage 3 (moderate): Secondary | ICD-10-CM | POA: Insufficient documentation

## 2016-11-06 LAB — CBC
HCT: 36.7 % (ref 35.0–47.0)
Hemoglobin: 12.4 g/dL (ref 12.0–16.0)
MCH: 31.3 pg (ref 26.0–34.0)
MCHC: 33.9 g/dL (ref 32.0–36.0)
MCV: 92.5 fL (ref 80.0–100.0)
PLATELETS: 300 10*3/uL (ref 150–440)
RBC: 3.96 MIL/uL (ref 3.80–5.20)
RDW: 12.6 % (ref 11.5–14.5)
WBC: 9.5 10*3/uL (ref 3.6–11.0)

## 2016-11-06 LAB — BASIC METABOLIC PANEL
Anion gap: 6 (ref 5–15)
BUN: 16 mg/dL (ref 6–20)
CO2: 23 mmol/L (ref 22–32)
CREATININE: 0.99 mg/dL (ref 0.44–1.00)
Calcium: 7.3 mg/dL — ABNORMAL LOW (ref 8.9–10.3)
Chloride: 108 mmol/L (ref 101–111)
GFR calc non Af Amer: 52 mL/min — ABNORMAL LOW (ref >60)
GFR, EST AFRICAN AMERICAN: 60 mL/min — AB (ref >60)
Glucose, Bld: 165 mg/dL — ABNORMAL HIGH (ref 65–99)
POTASSIUM: 3.6 mmol/L (ref 3.5–5.1)
SODIUM: 137 mmol/L (ref 135–145)

## 2016-11-06 LAB — TROPONIN I

## 2016-11-06 MED ORDER — SODIUM CHLORIDE 0.9 % IV BOLUS (SEPSIS)
1000.0000 mL | Freq: Once | INTRAVENOUS | Status: AC
  Administered 2016-11-06: 1000 mL via INTRAVENOUS

## 2016-11-06 NOTE — ED Notes (Signed)
AAOx3.  Skin warm and dry.   

## 2016-11-06 NOTE — ED Triage Notes (Signed)
ARrives via [facility] from home.  Patient reports two syncopal episodes this week, most recent one today. Denies c/o pain, dysuria.  STates she is otherwise feeling well, but just before passing out she feels "swimmy headed".

## 2016-11-06 NOTE — ED Provider Notes (Signed)
[facility] Emergency Department Provider Note  Time seen: 3:24 PM  I have reviewed the triage vital signs and the nursing notes.   HISTORY  Chief Complaint Loss of Consciousness    HPI Paula Thomas is a 83 y.o. female with a past medical history of atrial fibrillation, hypertension, syncope, presents the emergency department after syncopal episode. According to the patient and a friend/family member she was eating lunch today she went to stand up and got lightheaded. Her friend was able to help her back down to the chair or she states she briefly lost consciousness before awakening. This is a second time this week patient has had a syncopal episode. Patient states a very long history of syncopal episodes. States this is nothing new for her. Denies any chest pain, trouble breathing, headache. She does state she felt lightheaded just before the event happened and continues to feel somewhat lightheaded currently. Denies any dysuria or abdominal pain nausea vomiting or diarrhea.  Past Medical History:  Diagnosis Date  . Atrial fibrillation (HCC)   . Fatigue   . Hypertension   . Syncope   . Underweight     Patient Active Problem List   Diagnosis Date Noted  . Cognitive impairment 06/11/2016  . Wart 05/28/2016  . Sciatica 04/14/2015  . Weakness generalized 04/14/2015  . Risk for falls 04/14/2015  . Atrial fibrillation (HCC) 03/07/2015  . Syncope 03/07/2015  . Underweight 03/07/2015  . Hypertension 03/07/2015  . Osteoarthritis of left knee 03/07/2015  . Benign hypertension with CKD (chronic kidney disease) stage I 03/07/2015  . CKD (chronic kidney disease), stage III 03/07/2015    Past Surgical History:  Procedure Laterality Date  . ABDOMINAL HYSTERECTOMY    . EYE SURGERY Bilateral 2012   cataracts  . HERNIA REPAIR    . SPLENECTOMY    . TONSILLECTOMY      Prior to Admission medications   Medication Sig Start Date End Date Taking? Authorizing  Provider  apixaban (ELIQUIS) 2.5 MG TABS tablet Take 2.5 mg by mouth 2 (two) times daily. 03/07/16   Historical Provider, MD  Cholecalciferol (VITAMIN D) 2000 units CAPS Take 2,000 Units by mouth daily.    Historical Provider, MD  diltiazem (CARDIZEM CD) 240 MG 24 hr capsule Take 240 mg by mouth daily. 03/07/16   Historical Provider, MD  hydrochlorothiazide (HYDRODIURIL) 25 MG tablet Take 1 tablet (25 mg total) by mouth daily. 07/29/16   Gabriel Cirri, NP  losartan (COZAAR) 100 MG tablet Take 1 tablet (100 mg total) by mouth daily. 08/29/16   Megan P Johnson, DO  Multiple Vitamin (MULTI-VITAMINS) TABS Take by mouth.    Historical Provider, MD    Allergies  Allergen Reactions  . Lisinopril Cough  . Penicillin G Potassium [Penicillin G]   . Sulfa Antibiotics     Family History  Problem Relation Age of Onset  . Cancer Mother   . Cancer Father   . Heart attack Brother   . Heart attack Brother     Social History Social History  Substance Use Topics  . Smoking status: Former Games developer  . Smokeless tobacco: Never Used  . Alcohol use No    Review of Systems Constitutional: Negative for fever Cardiovascular: Negative for chest pain. Respiratory: Negative for shortness of breath. Gastrointestinal: Negative for abdominal pain, vomiting and diarrhea. Genitourinary: Negative for dysuria. Neurological: Negative for headaches, focal weakness or numbness. 10-point ROS otherwise negative.  ____________________________________________   PHYSICAL EXAM:  VITAL SIGNS: ED Triage  Vitals [11/06/16 1455]  Enc Vitals Group     BP (!) 124/59     Pulse Rate 79     Resp 16     Temp 97.8 F (36.6 C)     Temp Source Oral     SpO2 98 %     Weight 108 lb (49 kg)     Height 5\' 4"  (1.626 m)     Head Circumference      Peak Flow      Pain Score      Pain Loc      Pain Edu?      Excl. in GC?     Constitutional: Alert and oriented. Well appearing and in no distress. Eyes: Normal exam ENT    Head: Normocephalic and atraumatic.   Mouth/Throat: Mucous membranes are moist. Cardiovascular: Irregular rhythm, rate around 80bpm Respiratory: Normal respiratory effort without tachypnea nor retractions. Breath sounds are clear Gastrointestinal: Soft and nontender. No distention.  Musculoskeletal: Nontender with normal range of motion in all extremities. No lower extremity tenderness or edema. Neurologic:  Normal speech and language. No gross focal neurologic deficits. 5/5 motor in all extremities, no pronator drift. Sensation intact and equal in all extremities. No cranial nerve deficits. Skin:  Skin is warm, dry and intact.  Psychiatric: Mood and affect are normal.   ____________________________________________    EKG  EKG reviewed and interpreted by myself shows atrial fibrillation at 83 bpm, narrow QRS, normal axis, no concerning ST changes noted.  ____________________________________________    RADIOLOGY  CT is largely unchanged.  ____________________________________________   INITIAL IMPRESSION / ASSESSMENT AND PLAN / ED COURSE  Pertinent labs & imaging results that were available during my care of the patient were reviewed by me and considered in my medical decision making (see chart for details).  Patient presents to the emergency Department after syncopal episode. Patient states a long history of syncopal episodes in the past. Continues to feel somewhat lightheaded. We will check labs, CT head, urinalysis and IV hydrate while awaiting results. Overall the patient appears well.  CT head shows no acute abnormality. Patient's labs are largely at baseline. Troponin is negative. Patient states she feels fine and is asking to be discharged home.  ____________________________________________   FINAL CLINICAL IMPRESSION(S) / ED DIAGNOSES  Syncope    Minna AntisKevin Falyn Rubel, MD 11/06/16 [phone]

## 2016-11-11 ENCOUNTER — Ambulatory Visit: Payer: Medicare Other | Admitting: Unknown Physician Specialty

## 2016-11-15 ENCOUNTER — Ambulatory Visit (INDEPENDENT_AMBULATORY_CARE_PROVIDER_SITE_OTHER): Payer: Medicare Other | Admitting: Unknown Physician Specialty

## 2016-11-15 ENCOUNTER — Encounter: Payer: Self-pay | Admitting: Unknown Physician Specialty

## 2016-11-15 VITALS — BP 129/70 | HR 89 | Temp 97.9°F | Ht 61.3 in | Wt 111.0 lb

## 2016-11-15 DIAGNOSIS — R55 Syncope and collapse: Secondary | ICD-10-CM | POA: Diagnosis not present

## 2016-11-15 DIAGNOSIS — Z9181 History of falling: Secondary | ICD-10-CM | POA: Diagnosis not present

## 2016-11-15 DIAGNOSIS — R4189 Other symptoms and signs involving cognitive functions and awareness: Secondary | ICD-10-CM | POA: Diagnosis not present

## 2016-11-15 NOTE — Assessment & Plan Note (Signed)
These falls may not be new, but now has caregivers to observe her.  Will work on hydration and working up low calcium

## 2016-11-15 NOTE — Progress Notes (Signed)
BP 129/70 (BP Location: Left Arm, Patient Position: Sitting, Cuff Size: Small)   Pulse 89   Temp 97.9 F (36.6 C)   Ht 5' 1.3" (1.557 m) Comment: pt had shoes on  Wt 111 lb (50.3 kg) Comment: pt had shoes on  LMP  (LMP Unknown)   SpO2 97%   BMI 20.77 kg/m    Subjective:    Patient ID: Paula Thomas, female    DOB: 1932-04-02, 83 y.o.   MRN: [id number]  HPI: Paula Thomas is a 83 y.o. female  Chief Complaint  Patient presents with  . ER Follow up    pt's caregiver states that the patient had an episode where she passed out on 11/06/16. Caregiver states that she was not with her when it happened but another caregiver was and that she caught the patient so the patient did not fall down. Caregiver states that the patient's son thinks it may have happened because of dehydration.    Pt is here with caregiver who gives much of the history as pt is verbal but does have cognitive impairment  Syncope Pt presented to the ER with a witnessed sycopal event.  This happened March 7 and she went to stand and her eyes rolled to the back of her head.  Presented to the ER.  CT and EKG were without changes.    She seemed to be better after hydration.  She does not drink water but is now drinking Propel.    Review of labs are normal except she does show a markedly decreased CA level.    Relevant past medical, surgical, family and social history reviewed and updated as indicated. Interim medical history since our last visit reviewed. Allergies and medications reviewed and updated.  Review of Systems  Per HPI unless specifically indicated above     Objective:    BP 129/70 (BP Location: Left Arm, Patient Position: Sitting, Cuff Size: Small)   Pulse 89   Temp 97.9 F (36.6 C)   Ht 5' 1.3" (1.557 m) Comment: pt had shoes on  Wt 111 lb (50.3 kg) Comment: pt had shoes on  LMP  (LMP Unknown)   SpO2 97%   BMI 20.77 kg/m   Wt Readings from Last 3 Encounters:  11/15/16 111 lb (50.3 kg)    11/06/16 108 lb (49 kg)  11/04/16 108 lb 3.2 oz (49.1 kg)    Physical Exam  Constitutional: She is oriented to person, place, and time. She appears well-developed and well-nourished. No distress.  HENT:  Head: Normocephalic and atraumatic.  Eyes: Conjunctivae and lids are normal. Right eye exhibits no discharge. Left eye exhibits no discharge. No scleral icterus.  Neck: Normal range of motion. Neck supple. No JVD present. Carotid bruit is not present.  Cardiovascular: Normal rate and normal heart sounds.  An irregularly irregular rhythm present.  Pulmonary/Chest: Effort normal and breath sounds normal.  Abdominal: Normal appearance. There is no splenomegaly or hepatomegaly.  Musculoskeletal: Normal range of motion.  Neurological: She is alert and oriented to person, place, and time.  Skin: Skin is warm, dry and intact. No rash noted. No pallor.  Psychiatric: She has a normal mood and affect. Her behavior is normal. Judgment and thought content normal.   No orthostatic changes noted.    Results for orders placed or performed during the hospital encounter of 11/06/16  Basic metabolic panel  Result Value Ref Range   Sodium 137 135 - 145 mmol/L   Potassium 3.6 3.5 -  5.1 mmol/L   Chloride 108 101 - 111 mmol/L   CO2 23 22 - 32 mmol/L   Glucose, Bld 165 (H) 65 - 99 mg/dL   BUN 16 6 - 20 mg/dL   Creatinine, Ser 1.190.99 0.44 - 1.00 mg/dL   Calcium 7.3 (L) 8.9 - 10.3 mg/dL   GFR calc non Af Amer 52 (L) >60 mL/min   GFR calc Af Amer 60 (L) >60 mL/min   Anion gap 6 5 - 15  CBC  Result Value Ref Range   WBC 9.5 3.6 - 11.0 K/uL   RBC 3.96 3.80 - 5.20 MIL/uL   Hemoglobin 12.4 12.0 - 16.0 g/dL   HCT 14.736.7 82.935.0 - 56.247.0 %   MCV 92.5 80.0 - 100.0 fL   MCH 31.3 26.0 - 34.0 pg   MCHC 33.9 32.0 - 36.0 g/dL   RDW 13.012.6 86.511.5 - 78.414.5 %   Platelets 300 150 - 440 K/uL  Troponin I  Result Value Ref Range   Troponin I <0.03 <0.03 ng/mL      Assessment & Plan:   Problem List Items Addressed This  Visit      Unprioritized   Cognitive impairment    Discussed difficulty of learning safety devices.  Family and caregiver will continue to observe.        Hypocalcemia    This is a new problem with reviewing last labs.  Check further labs for diagnostic purposes.        Relevant Orders   Comprehensive metabolic panel   Phosphorus   PTH, Intact and Calcium   TSH   Magnesium   Albumin   Risk for falls    Is having increased caregiving hours.  I discussed with the caregiver to check BP while she stands.  Reviewed medical alert necklace and family and caregiver will try to emphasize when and how to use it and that is not yet in her memory bank.  Trying to remember to use the walker      Syncope - Primary    These falls may not be new, but now has caregivers to observe her.  Will work on hydration and working up low calcium         Greater than 50% of this 25 minute visit was spent in counseling/coordination of care regarding safety issues and cognitive impairment   Follow up plan: Return in about 3 months (around 02/15/2017).

## 2016-11-15 NOTE — Assessment & Plan Note (Signed)
This is a new problem with reviewing last labs.  Check further labs for diagnostic purposes.

## 2016-11-15 NOTE — Assessment & Plan Note (Addendum)
Is having increased caregiving hours.  I discussed with the caregiver to check BP while she stands.  Reviewed medical alert necklace and family and caregiver will try to emphasize when and how to use it and that is not yet in her memory bank.  Trying to remember to use the walker

## 2016-11-15 NOTE — Assessment & Plan Note (Signed)
Discussed difficulty of learning safety devices.  Family and caregiver will continue to observe.

## 2016-11-16 LAB — PTH, INTACT AND CALCIUM: PTH: 19 pg/mL (ref 15–65)

## 2016-11-16 LAB — COMPREHENSIVE METABOLIC PANEL
ALT: 15 IU/L (ref 0–32)
AST: 22 IU/L (ref 0–40)
Albumin/Globulin Ratio: 1.9 (ref 1.2–2.2)
Albumin: 4.5 g/dL (ref 3.5–4.7)
Alkaline Phosphatase: 97 IU/L (ref 39–117)
BUN/Creatinine Ratio: 14 (ref 12–28)
BUN: 14 mg/dL (ref 8–27)
Bilirubin Total: 0.3 mg/dL (ref 0.0–1.2)
CO2: 27 mmol/L (ref 18–29)
CREATININE: 0.98 mg/dL (ref 0.57–1.00)
Calcium: 9.4 mg/dL (ref 8.7–10.3)
Chloride: 93 mmol/L — ABNORMAL LOW (ref 96–106)
GFR calc Af Amer: 62 mL/min/{1.73_m2} (ref >59)
GFR calc non Af Amer: 54 mL/min/{1.73_m2} — ABNORMAL LOW (ref >59)
GLOBULIN, TOTAL: 2.4 g/dL (ref 1.5–4.5)
Glucose: 86 mg/dL (ref 65–99)
POTASSIUM: 4.1 mmol/L (ref 3.5–5.2)
SODIUM: 135 mmol/L (ref 134–144)
Total Protein: 6.9 g/dL (ref 6.0–8.5)

## 2016-11-16 LAB — MAGNESIUM: MAGNESIUM: 2 mg/dL (ref 1.6–2.3)

## 2016-11-16 LAB — TSH: TSH: 1.81 u[IU]/mL (ref 0.450–4.500)

## 2016-11-16 LAB — PHOSPHORUS: PHOSPHORUS: 3.3 mg/dL (ref 2.5–4.5)

## 2016-11-18 ENCOUNTER — Encounter: Payer: Self-pay | Admitting: Unknown Physician Specialty

## 2016-11-23 ENCOUNTER — Other Ambulatory Visit: Payer: Self-pay | Admitting: Family Medicine

## 2016-11-25 NOTE — Telephone Encounter (Signed)
Your patient 

## 2016-12-09 ENCOUNTER — Telehealth: Payer: Self-pay | Admitting: Unknown Physician Specialty

## 2016-12-09 NOTE — Telephone Encounter (Signed)
Please tell him to skip tomorrow's Elquis and check her BP.  If she is dizzy, she should go to the ER.  Someone will need to manage her medications.

## 2016-12-09 NOTE — Telephone Encounter (Signed)
Rocky Link called and stated he thinks the pt may have accidentally taken her medication twice this morning due to confusion.

## 2016-12-09 NOTE — Telephone Encounter (Signed)
Called and spoke to patient's son. I asked which medication his mom may have taken twice this morning and he stated all of them. I told him I would send this message to [facility] and let her know.

## 2016-12-09 NOTE — Telephone Encounter (Signed)
Called and gave the patient's son, Rocky Link, Cheryl's instructions. Rocky Link stated that his brother sets up their mom's pill box every week for her and that there is a caregiver there with her for about 5 to 6 hours per day. I asked for them to give [place] a call if they have any questions or concerns.

## 2016-12-09 NOTE — Telephone Encounter (Signed)
Tried calling patient's son, he did not answer and VM box was full. Will try to call again in the morning.

## 2017-01-01 ENCOUNTER — Encounter: Payer: Self-pay | Admitting: *Deleted

## 2017-01-01 ENCOUNTER — Emergency Department
Admission: EM | Admit: 2017-01-01 | Discharge: 2017-01-01 | Disposition: A | Discharge status: Home / Self Care | Payer: Medicare Other | Attending: Student in an Organized Health Care Education/Training Program | Admitting: Student in an Organized Health Care Education/Training Program

## 2017-01-01 ENCOUNTER — Emergency Department: Payer: Medicare Other

## 2017-01-01 DIAGNOSIS — E860 Dehydration: Secondary | ICD-10-CM | POA: Diagnosis not present

## 2017-01-01 DIAGNOSIS — M542 Cervicalgia: Secondary | ICD-10-CM | POA: Insufficient documentation

## 2017-01-01 DIAGNOSIS — Z87891 Personal history of nicotine dependence: Secondary | ICD-10-CM | POA: Insufficient documentation

## 2017-01-01 DIAGNOSIS — Z79899 Other long term (current) drug therapy: Secondary | ICD-10-CM | POA: Insufficient documentation

## 2017-01-01 DIAGNOSIS — N183 Chronic kidney disease, stage 3 (moderate): Secondary | ICD-10-CM | POA: Insufficient documentation

## 2017-01-01 DIAGNOSIS — I129 Hypertensive chronic kidney disease with stage 1 through stage 4 chronic kidney disease, or unspecified chronic kidney disease: Secondary | ICD-10-CM | POA: Diagnosis not present

## 2017-01-01 DIAGNOSIS — R55 Syncope and collapse: Secondary | ICD-10-CM | POA: Diagnosis present

## 2017-01-01 DIAGNOSIS — R0689 Other abnormalities of breathing: Secondary | ICD-10-CM | POA: Diagnosis not present

## 2017-01-01 DIAGNOSIS — E876 Hypokalemia: Secondary | ICD-10-CM | POA: Insufficient documentation

## 2017-01-01 LAB — URINALYSIS, COMPLETE (UACMP) WITH MICROSCOPIC
BILIRUBIN URINE: NEGATIVE
Bacteria, UA: NONE SEEN
GLUCOSE, UA: NEGATIVE mg/dL
HGB URINE DIPSTICK: NEGATIVE
Ketones, ur: NEGATIVE mg/dL
Leukocytes, UA: NEGATIVE
Nitrite: NEGATIVE
PH: 7 (ref 5.0–8.0)
Protein, ur: NEGATIVE mg/dL
RBC / HPF: NONE SEEN RBC/hpf (ref 0–5)
SPECIFIC GRAVITY, URINE: 1.024 (ref 1.005–1.030)
Squamous Epithelial / LPF: NONE SEEN

## 2017-01-01 LAB — CBC
HCT: 32.5 % — ABNORMAL LOW (ref 35.0–47.0)
Hemoglobin: 10.9 g/dL — ABNORMAL LOW (ref 12.0–16.0)
MCH: 30.4 pg (ref 26.0–34.0)
MCHC: 33.5 g/dL (ref 32.0–36.0)
MCV: 90.7 fL (ref 80.0–100.0)
Platelets: 331 10*3/uL (ref 150–440)
RBC: 3.58 MIL/uL — ABNORMAL LOW (ref 3.80–5.20)
RDW: 12.9 % (ref 11.5–14.5)
WBC: 8.9 10*3/uL (ref 3.6–11.0)

## 2017-01-01 LAB — BASIC METABOLIC PANEL
Anion gap: 7 (ref 5–15)
BUN: 13 mg/dL (ref 6–20)
CALCIUM: 8.4 mg/dL — AB (ref 8.9–10.3)
CO2: 27 mmol/L (ref 22–32)
Chloride: 102 mmol/L (ref 101–111)
Creatinine, Ser: 0.94 mg/dL (ref 0.44–1.00)
GFR calc Af Amer: >60 mL/min (ref >60)
GFR, EST NON AFRICAN AMERICAN: 55 mL/min — AB (ref >60)
GLUCOSE: 151 mg/dL — AB (ref 65–99)
Potassium: 3 mmol/L — ABNORMAL LOW (ref 3.5–5.1)
Sodium: 136 mmol/L (ref 135–145)

## 2017-01-01 LAB — TROPONIN I

## 2017-01-01 LAB — BRAIN NATRIURETIC PEPTIDE: B NATRIURETIC PEPTIDE 5: 551 pg/mL — AB (ref 0.0–100.0)

## 2017-01-01 MED ORDER — IOPAMIDOL (ISOVUE-370) INJECTION 76%
75.0000 mL | Freq: Once | INTRAVENOUS | Status: AC | PRN
  Administered 2017-01-01: 75 mL via INTRAVENOUS

## 2017-01-01 MED ORDER — POTASSIUM CHLORIDE CRYS ER 20 MEQ PO TBCR
40.0000 meq | EXTENDED_RELEASE_TABLET | Freq: Once | ORAL | Status: AC
  Administered 2017-01-01: 40 meq via ORAL
  Filled 2017-01-01: qty 2

## 2017-01-01 MED ORDER — SODIUM CHLORIDE 0.9 % IV BOLUS (SEPSIS)
500.0000 mL | Freq: Once | INTRAVENOUS | Status: AC
  Administered 2017-01-01: 500 mL via INTRAVENOUS

## 2017-01-01 NOTE — ED Notes (Signed)
Pt drinking fluids without difficulty or nausea reported.

## 2017-01-01 NOTE — ED Triage Notes (Addendum)
PT arrived to ED after a reported syncopal episode. Pt was sitting at home with home health when pt became lethargic. EMS reports pt appeared unresponsive but after closing EMS truck door pt "woke up." Pt denies having memory of event. Pt denies memory of sitting porch this morning at all. Pt reports having left sided neck pain that increases with movement. PT denies CP, dizziness, SOB, Weakness, NV.   Pt alert and oriented x 4.

## 2017-01-01 NOTE — ED Notes (Signed)
Pt ambulatory to bathroom independently and without assistance. No dizziness reported. Pt stable at this time.

## 2017-01-01 NOTE — ED Notes (Signed)
Pt ambulatory but taken to car in wheelchair for safety. Pt in NAD and has no neuro deficits at this time. No weakness reported.

## 2017-01-01 NOTE — ED Provider Notes (Signed)
[facility] Emergency Department Provider Note    First MD Initiated Contact with Patient 01/01/17 1440     (approximate)  I have reviewed the triage vital signs and the nursing notes.   HISTORY  Chief Complaint Loss of Consciousness and Atrial Fibrillation    HPI Paula Thomas is a 83 y.o. female with a history of A. fib as well as syncopal events presents after a syncopal event that occurred this morning. Patient states that she was walking around in her garden this morning. Was with her home health aide. They went to go sit on the porch. She is amnestic to any event until she woke up here in the door close and the ambulance. Per home health aid, the patient became lethargic and unresponsive. No further details provided. Was concerned the patient slumped over and hit her head. Patient is on [place] course for A. fib. States that she feels fine right now with exception of left-sided neck pain. Pain is worsened with movement. Pain is new after this event. She denies any chest pain, palpitations, dizziness, shortness of breath, weakness, abdominal pain.   Past Medical History:  Diagnosis Date  . Atrial fibrillation (HCC)   . Fatigue   . Hypertension   . Syncope   . Underweight    Family History  Problem Relation Age of Onset  . Cancer Mother   . Cancer Father   . Heart attack Brother   . Heart attack Brother    Past Surgical History:  Procedure Laterality Date  . ABDOMINAL HYSTERECTOMY    . EYE SURGERY Bilateral 2012   cataracts  . HERNIA REPAIR    . SPLENECTOMY    . TONSILLECTOMY     Patient Active Problem List   Diagnosis Date Noted  . Hypocalcemia 11/15/2016  . Cognitive impairment 06/11/2016  . Wart 05/28/2016  . Sciatica 04/14/2015  . Weakness generalized 04/14/2015  . Risk for falls 04/14/2015  . Atrial fibrillation (HCC) 03/07/2015  . Syncope 03/07/2015  . Underweight 03/07/2015  . Hypertension 03/07/2015  . Osteoarthritis of  left knee 03/07/2015  . Benign hypertension with CKD (chronic kidney disease) stage I 03/07/2015  . CKD (chronic kidney disease), stage III 03/07/2015      Prior to Admission medications   Medication Sig Start Date End Date Taking? Authorizing Provider  apixaban (ELIQUIS) 2.5 MG TABS tablet Take 2.5 mg by mouth 2 (two) times daily. 03/07/16  Yes Historical Provider, MD  Cholecalciferol (VITAMIN D) 2000 units CAPS Take 2,000 Units by mouth daily.   Yes Historical Provider, MD  diltiazem (CARDIZEM CD) 240 MG 24 hr capsule Take 240 mg by mouth daily. 03/07/16  Yes Historical Provider, MD  hydrochlorothiazide (HYDRODIURIL) 25 MG tablet Take 1 tablet (25 mg total) by mouth daily. Patient taking differently: Take 12.5 mg by mouth daily.  07/29/16  Yes Gabriel Cirri, NP  losartan (COZAAR) 100 MG tablet TAKE 1 TABLET BY MOUTH ONCE DAILY 11/25/16  Yes Gabriel Cirri, NP  Multiple Vitamin (MULTI-VITAMINS) TABS Take by mouth.   Yes Historical Provider, MD    Allergies Lisinopril; Penicillin g potassium [penicillin g]; and Sulfa antibiotics    Social History Social History  Substance Use Topics  . Smoking status: Former Games developer  . Smokeless tobacco: Never Used  . Alcohol use No    Review of Systems Patient denies headaches, rhinorrhea, blurry vision, numbness, shortness of breath, chest pain, edema, cough, abdominal pain, nausea, vomiting, diarrhea, dysuria, fevers, rashes or hallucinations unless  otherwise stated above in HPI. ____________________________________________   PHYSICAL EXAM:  VITAL SIGNS: Vitals:   01/01/17 1432  BP: 134/70  Pulse: 69  Resp: 16  Temp: 98.3 F (36.8 C)    Constitutional: Alert and oriented. Well appearing and in no acute distress. Eyes: Conjunctivae are normal. PERRL. EOMI. Head: Atraumatic. Nose: No congestion/rhinnorhea. Mouth/Throat: Mucous membranes are moist.  Oropharynx non-erythematous. Neck: No stridor. Painless ROM. No cervical spine tenderness  to palpation Hematological/Lymphatic/Immunilogical: No cervical lymphadenopathy. Cardiovascular: Normal rate, regular rhythm. Grossly normal heart sounds.  Good peripheral circulation. Respiratory: Normal respiratory effort.  No retractions. Lungs CTAB. Gastrointestinal: Soft and nontender. No distention. No abdominal bruits. No CVA tenderness. Genitourinary:  Musculoskeletal: No lower extremity tenderness, 1+ edema.  No joint effusions. Neurologic:  Normal speech and language. No gross focal neurologic deficits are appreciated. No gait instability. Skin:  Skin is warm, dry and intact. No rash noted. Psychiatric: Mood and affect are normal. Speech and behavior are normal.  ____________________________________________   LABS (all labs ordered are listed, but only abnormal results are displayed)  Results for orders placed or performed during the hospital encounter of 01/01/17 (from the past 24 hour(s))  Basic metabolic panel     Status: Abnormal   Collection Time: 01/01/17  2:29 PM  Result Value Ref Range   Sodium 136 135 - 145 mmol/L   Potassium 3.0 (L) 3.5 - 5.1 mmol/L   Chloride 102 101 - 111 mmol/L   CO2 27 22 - 32 mmol/L   Glucose, Bld 151 (H) 65 - 99 mg/dL   BUN 13 6 - 20 mg/dL   Creatinine, Ser 4.09 0.44 - 1.00 mg/dL   Calcium 8.4 (L) 8.9 - 10.3 mg/dL   GFR calc non Af Amer 55 (L) >60 mL/min   GFR calc Af Amer >60 >60 mL/min   Anion gap 7 5 - 15  CBC     Status: Abnormal   Collection Time: 01/01/17  2:29 PM  Result Value Ref Range   WBC 8.9 3.6 - 11.0 K/uL   RBC 3.58 (L) 3.80 - 5.20 MIL/uL   Hemoglobin 10.9 (L) 12.0 - 16.0 g/dL   HCT 81.1 (L) 91.4 - 78.2 %   MCV 90.7 80.0 - 100.0 fL   MCH 30.4 26.0 - 34.0 pg   MCHC 33.5 32.0 - 36.0 g/dL   RDW 95.6 21.3 - 08.6 %   Platelets 331 150 - 440 K/uL  Troponin I     Status: None   Collection Time: 01/01/17  2:29 PM  Result Value Ref Range   Troponin I <0.03 <0.03 ng/mL    ____________________________________________  EKG My review and personal interpretation at Time: 14:24   Indication: syncope  Rate: 75  Rhythm: afib Axis: right Other: normal intervals, no st elevations ____________________________________________  RADIOLOGY  I personally reviewed all radiographic images ordered to evaluate for the above acute complaints and reviewed radiology reports and findings.  These findings were personally discussed with the patient.  Please see medical record for radiology report.  ____________________________________________   PROCEDURES  Procedure(s) performed:  Procedures    Critical Care performed: no ____________________________________________   INITIAL IMPRESSION / ASSESSMENT AND PLAN / ED COURSE  Pertinent labs & imaging results that were available during my care of the patient were reviewed by me and considered in my medical decision making (see chart for details).  DDX: dehydration, dysrhythmia, acs, chf, electrolye ab, dissection, tia  Adelaida Reindel is a 83 y.o. who presents to the  ED with history of fainting spells presents with a similar episode that occurred today after she was working outside in her garden. Patient was lightheaded and became unresponsive on porch. No significant head injury appreciated but patient is on [place] and is complaining of left-sided neck pain. Her neuro exam is nonfocal. She has no acute deficits. Patient does appear mildly dehydrated but not critically so. We'll provide IV fluids. EKG shows chronic A. fib that is unchanged from previous. Patient denies any chest pain or palpitations making cardiac syncope little bit less likely the patient is at somewhat increased risk given her age. She denies any shortness of breath.  The patient will be placed on continuous pulse oximetry and telemetry for monitoring.  Laboratory evaluation will be sent to evaluate for the above complaints.     Clinical Course as  of Jan 01 1901  Wed Jan 01, 2017  1836 Blood work is otherwise reassuring. Patient with improvement in symptoms after receiving IV fluids and is eating a meal tray.  Based on patient's age,  I have recommended admission for further evaluation of her syncope. Patient is adamant that she was to go home. Son also reiterates that he thinks that she probably did get overheated while working on the garden. Has been struggling to keep up with her oral hydration. I have encouraged patient is stable longer for additional cardiac monitoring. We'll repeat troponin.  Does seem a bit less likely to be truly cardiac as she awoke to the door slamming shut in the ambulance.  [PR]  1858 Repeat trop negative.  Patient requesting discharge home.  Again encouraged patient to reconsider staying for further evaluation but she states she feels well and would like to go home.  Given her presentation likely secondary to dehydration and heat illness, with improvement in symptoms after IVF and patient now tolerating PO and ambulating without lightheadedness or dizziness,  will discharge patient for close outpatient follow up.    [PR]    Clinical Course User Index [PR] Willy Eddy, MD     ____________________________________________   FINAL CLINICAL IMPRESSION(S) / ED DIAGNOSES  Final diagnoses:  Syncope, unspecified syncope type  Dehydration  Hypokalemia      NEW MEDICATIONS STARTED DURING THIS VISIT:  New Prescriptions   No medications on file     Note:  This document was prepared using Dragon voice recognition software and may include unintentional dictation errors.    Willy Eddy, MD 01/01/17 [phone]

## 2017-01-01 NOTE — ED Notes (Signed)
MD at bedside with pt and pts son.  

## 2017-01-06 ENCOUNTER — Telehealth: Payer: Self-pay

## 2017-01-06 NOTE — Telephone Encounter (Signed)
Spoke with Mr. Lavonna RuaCostner the patients son. He states that he is a Engineer, siteschool teacher and he takes his mom to her appointments so he would like to make an appointment but does not want to make it until June when school is out.   I will send a letter to the residence and attempt to schedule an appointment the first week of June.   Thank you for the referral.

## 2017-01-28 ENCOUNTER — Ambulatory Visit: Payer: Medicare Other | Admitting: Unknown Physician Specialty

## 2017-02-07 ENCOUNTER — Ambulatory Visit (INDEPENDENT_AMBULATORY_CARE_PROVIDER_SITE_OTHER): Payer: Medicare Other | Admitting: Unknown Physician Specialty

## 2017-02-07 ENCOUNTER — Encounter: Payer: Self-pay | Admitting: Unknown Physician Specialty

## 2017-02-07 DIAGNOSIS — Z7189 Other specified counseling: Secondary | ICD-10-CM | POA: Diagnosis not present

## 2017-02-07 DIAGNOSIS — D649 Anemia, unspecified: Secondary | ICD-10-CM | POA: Diagnosis not present

## 2017-02-07 DIAGNOSIS — I10 Essential (primary) hypertension: Secondary | ICD-10-CM | POA: Diagnosis not present

## 2017-02-07 DIAGNOSIS — R55 Syncope and collapse: Secondary | ICD-10-CM | POA: Diagnosis not present

## 2017-02-07 NOTE — Assessment & Plan Note (Signed)
Improved with standing.

## 2017-02-07 NOTE — Assessment & Plan Note (Addendum)
Son [place] and her daughter in [place] are her primary decision makers.  Pt lacks the cognitive ability to make an independent decision.  Will schedule appt with her and daughter when she is in town

## 2017-02-07 NOTE — Assessment & Plan Note (Signed)
Check anemia panel °

## 2017-02-07 NOTE — Assessment & Plan Note (Signed)
Concern for safety.  Will talk to daughter when she is in town

## 2017-02-07 NOTE — Progress Notes (Signed)
BP (!) 144/70   Pulse 87   Temp 98.5 F (36.9 C)   Wt 112 lb 9.6 oz (51.1 kg)   LMP  (LMP Unknown)   SpO2 97%   BMI 19.33 kg/m    Subjective:    Patient ID: Paula Thomas, female    DOB: [birth year], 83 y.o.   MRN: [id number]  HPI: Paula Thomas is a 83 y.o. female  Chief Complaint  Patient presents with  . Hypertension   Pt is here with her caregiver who gives part of the history.    Hypertension 144/70 on recheck Using medications without difficulty Average home BPs Not checking  No problems or lightheadedness No chest pain with exertion or shortness of breath No Edema  Cognitive impairment Has home help during the week at varying hours.    Syncope Had a syncopal episode in which she lost consciousness with her head forward blocking airway.  Her caregiver happened to be there but concern is if this happens without a caregiver.      Review of last labs show anemia and mild CKD.    Relevant past medical, surgical, family and social history reviewed and updated as indicated. Interim medical history since our last visit reviewed. Allergies and medications reviewed and updated.  Review of Systems  Per HPI unless specifically indicated above     Objective:    BP (!) 144/70   Pulse 87   Temp 98.5 F (36.9 C)   Wt 112 lb 9.6 oz (51.1 kg)   LMP  (LMP Unknown)   SpO2 97%   BMI 19.33 kg/m   Wt Readings from Last 3 Encounters:  02/07/17 112 lb 9.6 oz (51.1 kg)  01/01/17 115 lb (52.2 kg)  11/15/16 111 lb (50.3 kg)    Physical Exam  Constitutional: She is oriented to person, place, and time. She appears well-developed and well-nourished. No distress.  HENT:  Head: Normocephalic and atraumatic.  Eyes: Conjunctivae and lids are normal. Right eye exhibits no discharge. Left eye exhibits no discharge. No scleral icterus.  Neck: Normal range of motion. Neck supple. No JVD present. Carotid bruit is not present.  Cardiovascular: An irregularly irregular  rhythm present.  Pulmonary/Chest: Effort normal and breath sounds normal.  Abdominal: Normal appearance. There is no splenomegaly or hepatomegaly.  Musculoskeletal: Normal range of motion.  Neurological: She is alert and oriented to person, place, and time.  Skin: Skin is warm, dry and intact. No rash noted. No pallor.  Psychiatric: She has a normal mood and affect. Her behavior is normal. Judgment and thought content normal.    Results for orders placed or performed during the hospital encounter of 01/01/17  Basic metabolic panel  Result Value Ref Range   Sodium 136 135 - 145 mmol/L   Potassium 3.0 (L) 3.5 - 5.1 mmol/L   Chloride 102 101 - 111 mmol/L   CO2 27 22 - 32 mmol/L   Glucose, Bld 151 (H) 65 - 99 mg/dL   BUN 13 6 - 20 mg/dL   Creatinine, Ser 7.820.94 0.44 - 1.00 mg/dL   Calcium 8.4 (L) 8.9 - 10.3 mg/dL   GFR calc non Af Amer 55 (L) >60 mL/min   GFR calc Af Amer >60 >60 mL/min   Anion gap 7 5 - 15  CBC  Result Value Ref Range   WBC 8.9 3.6 - 11.0 K/uL   RBC 3.58 (L) 3.80 - 5.20 MIL/uL   Hemoglobin 10.9 (L) 12.0 - 16.0 g/dL  HCT 32.5 (L) 35.0 - 47.0 %   MCV 90.7 80.0 - 100.0 fL   MCH 30.4 26.0 - 34.0 pg   MCHC 33.5 32.0 - 36.0 g/dL   RDW 16.1 09.6 - 04.5 %   Platelets 331 150 - 440 K/uL  Urinalysis, Complete w Microscopic  Result Value Ref Range   Color, Urine STRAW (A) YELLOW   APPearance CLEAR (A) CLEAR   Specific Gravity, Urine 1.024 1.005 - 1.030   pH 7.0 5.0 - 8.0   Glucose, UA NEGATIVE NEGATIVE mg/dL   Hgb urine dipstick NEGATIVE NEGATIVE   Bilirubin Urine NEGATIVE NEGATIVE   Ketones, ur NEGATIVE NEGATIVE mg/dL   Protein, ur NEGATIVE NEGATIVE mg/dL   Nitrite NEGATIVE NEGATIVE   Leukocytes, UA NEGATIVE NEGATIVE   RBC / HPF NONE SEEN 0 - 5 RBC/hpf   WBC, UA 0-5 0 - 5 WBC/hpf   Bacteria, UA NONE SEEN NONE SEEN   Squamous Epithelial / LPF NONE SEEN NONE SEEN  Troponin I  Result Value Ref Range   Troponin I <0.03 <0.03 ng/mL  Brain natriuretic peptide    Result Value Ref Range   B Natriuretic Peptide 551.0 (H) 0.0 - 100.0 pg/mL  Troponin I  Result Value Ref Range   Troponin I <0.03 <0.03 ng/mL      Assessment & Plan:   Problem List Items Addressed This Visit      Unprioritized   Advance care planning    Son Rocky Link in [place] and her daughter in [place] are her primary decision makers.  Pt lacks the cognitive ability to make an independent decision.  Will schedule appt with her and daughter when she is in town      Anemia    Check anemia panel      Relevant Orders   Anemia panel   Hypertension    Improved with standing.        Relevant Orders   Lipid Panel w/o Chol/HDL Ratio   Comprehensive metabolic panel   Syncope    Concern for safety.  Will talk to daughter when she is in town      Relevant Orders   CBC with Differential/Platelet       Follow up plan: Return for I would like to see her when daughter is in town.

## 2017-02-10 ENCOUNTER — Encounter: Payer: Self-pay | Admitting: Unknown Physician Specialty

## 2017-02-11 LAB — CBC WITH DIFFERENTIAL/PLATELET
BASOS: 0 %
Basophils Absolute: 0 10*3/uL (ref 0.0–0.2)
EOS (ABSOLUTE): 0.1 10*3/uL (ref 0.0–0.4)
EOS: 1 %
HEMOGLOBIN: 11.8 g/dL (ref 11.1–15.9)
Immature Grans (Abs): 0 10*3/uL (ref 0.0–0.1)
Immature Granulocytes: 0 %
LYMPHS ABS: 2.5 10*3/uL (ref 0.7–3.1)
Lymphs: 23 %
MCH: 29.3 pg (ref 26.6–33.0)
MCHC: 32.8 g/dL (ref 31.5–35.7)
MCV: 89 fL (ref 79–97)
Monocytes Absolute: 0.6 10*3/uL (ref 0.1–0.9)
Monocytes: 6 %
NEUTROS ABS: 7.7 10*3/uL — AB (ref 1.4–7.0)
Neutrophils: 70 %
PLATELETS: 405 10*3/uL — AB (ref 150–379)
RBC: 4.03 x10E6/uL (ref 3.77–5.28)
RDW: 14.4 % (ref 12.3–15.4)
WBC: 10.9 10*3/uL — ABNORMAL HIGH (ref 3.4–10.8)

## 2017-02-11 LAB — COMPREHENSIVE METABOLIC PANEL
ALBUMIN: 4.4 g/dL (ref 3.5–4.7)
ALT: 17 IU/L (ref 0–32)
AST: 22 IU/L (ref 0–40)
Albumin/Globulin Ratio: 1.5 (ref 1.2–2.2)
Alkaline Phosphatase: 107 IU/L (ref 39–117)
BILIRUBIN TOTAL: 0.2 mg/dL (ref 0.0–1.2)
BUN / CREAT RATIO: 18 (ref 12–28)
BUN: 17 mg/dL (ref 8–27)
CALCIUM: 9.7 mg/dL (ref 8.7–10.3)
CHLORIDE: 95 mmol/L — AB (ref 96–106)
CO2: 26 mmol/L (ref 18–29)
CREATININE: 0.95 mg/dL (ref 0.57–1.00)
GFR, EST AFRICAN AMERICAN: 65 mL/min/{1.73_m2} (ref >59)
GFR, EST NON AFRICAN AMERICAN: 56 mL/min/{1.73_m2} — AB (ref >59)
GLUCOSE: 81 mg/dL (ref 65–99)
Globulin, Total: 2.9 g/dL (ref 1.5–4.5)
Potassium: 4.1 mmol/L (ref 3.5–5.2)
Sodium: 138 mmol/L (ref 134–144)
TOTAL PROTEIN: 7.3 g/dL (ref 6.0–8.5)

## 2017-02-11 LAB — ANEMIA PANEL
FERRITIN: 18 ng/mL (ref 15–150)
Hematocrit: 36 % (ref 34.0–46.6)
IRON SATURATION: 8 % — AB (ref 15–55)
Iron: 31 ug/dL (ref 27–139)
Retic Ct Pct: 0.8 % (ref 0.6–2.6)
Total Iron Binding Capacity: 379 ug/dL (ref 250–450)
UIBC: 348 ug/dL (ref 118–369)
VITAMIN B 12: 850 pg/mL (ref 232–1245)

## 2017-02-11 LAB — LIPID PANEL W/O CHOL/HDL RATIO
Cholesterol, Total: 204 mg/dL — ABNORMAL HIGH (ref 100–199)
HDL: 67 mg/dL (ref >39)
LDL Calculated: 110 mg/dL — ABNORMAL HIGH (ref 0–99)
TRIGLYCERIDES: 133 mg/dL (ref 0–149)
VLDL Cholesterol Cal: 27 mg/dL (ref 5–40)

## 2017-03-04 ENCOUNTER — Ambulatory Visit: Payer: Medicare Other | Admitting: Unknown Physician Specialty

## 2017-03-15 ENCOUNTER — Emergency Department
Admission: EM | Admit: 2017-03-15 | Discharge: 2017-03-15 | Disposition: A | Discharge status: Home / Self Care | Payer: Medicare Other | Attending: Emergency Medicine | Admitting: Emergency Medicine

## 2017-03-15 ENCOUNTER — Emergency Department: Payer: Medicare Other

## 2017-03-15 DIAGNOSIS — R55 Syncope and collapse: Secondary | ICD-10-CM | POA: Diagnosis present

## 2017-03-15 DIAGNOSIS — Z5321 Procedure and treatment not carried out due to patient leaving prior to being seen by health care provider: Secondary | ICD-10-CM | POA: Diagnosis not present

## 2017-03-15 LAB — CBC
HCT: 38.5 % (ref 35.0–47.0)
Hemoglobin: 12.6 g/dL (ref 12.0–16.0)
MCH: 29.4 pg (ref 26.0–34.0)
MCHC: 32.6 g/dL (ref 32.0–36.0)
MCV: 90 fL (ref 80.0–100.0)
PLATELETS: 412 10*3/uL (ref 150–440)
RBC: 4.28 MIL/uL (ref 3.80–5.20)
RDW: 13.8 % (ref 11.5–14.5)
WBC: 11.4 10*3/uL — AB (ref 3.6–11.0)

## 2017-03-15 LAB — COMPREHENSIVE METABOLIC PANEL
ALT: 22 U/L (ref 14–54)
AST: 33 U/L (ref 15–41)
Albumin: 4.3 g/dL (ref 3.5–5.0)
Alkaline Phosphatase: 108 U/L (ref 38–126)
Anion gap: 11 (ref 5–15)
BUN: 17 mg/dL (ref 6–20)
CHLORIDE: 98 mmol/L — AB (ref 101–111)
CO2: 28 mmol/L (ref 22–32)
CREATININE: 0.97 mg/dL (ref 0.44–1.00)
Calcium: 9.2 mg/dL (ref 8.9–10.3)
GFR, EST NON AFRICAN AMERICAN: 53 mL/min — AB (ref >60)
Glucose, Bld: 176 mg/dL — ABNORMAL HIGH (ref 65–99)
POTASSIUM: 3.6 mmol/L (ref 3.5–5.1)
SODIUM: 137 mmol/L (ref 135–145)
Total Bilirubin: 0.5 mg/dL (ref 0.3–1.2)
Total Protein: 7.9 g/dL (ref 6.5–8.1)

## 2017-03-15 LAB — TROPONIN I

## 2017-03-15 NOTE — ED Triage Notes (Signed)
States fell yesterday. Pain L ribs. Daughter states her mom told her she passed out and woke on floor.

## 2017-03-17 ENCOUNTER — Ambulatory Visit (INDEPENDENT_AMBULATORY_CARE_PROVIDER_SITE_OTHER): Payer: Medicare Other | Admitting: Unknown Physician Specialty

## 2017-03-17 ENCOUNTER — Telehealth: Payer: Self-pay | Admitting: Unknown Physician Specialty

## 2017-03-17 ENCOUNTER — Telehealth: Payer: Self-pay | Admitting: Emergency Medicine

## 2017-03-17 ENCOUNTER — Encounter: Payer: Self-pay | Admitting: Unknown Physician Specialty

## 2017-03-17 VITALS — BP 120/70 | HR 81 | Temp 98.4°F | Wt 112.2 lb

## 2017-03-17 DIAGNOSIS — Z9181 History of falling: Secondary | ICD-10-CM | POA: Diagnosis not present

## 2017-03-17 DIAGNOSIS — N181 Chronic kidney disease, stage 1: Secondary | ICD-10-CM | POA: Diagnosis not present

## 2017-03-17 DIAGNOSIS — S2232XD Fracture of one rib, left side, subsequent encounter for fracture with routine healing: Secondary | ICD-10-CM | POA: Diagnosis not present

## 2017-03-17 DIAGNOSIS — Z7189 Other specified counseling: Secondary | ICD-10-CM | POA: Diagnosis not present

## 2017-03-17 DIAGNOSIS — I129 Hypertensive chronic kidney disease with stage 1 through stage 4 chronic kidney disease, or unspecified chronic kidney disease: Secondary | ICD-10-CM

## 2017-03-17 MED ORDER — LIDOCAINE 5 % EX PTCH: 1.0000 | MEDICATED_PATCH | CUTANEOUS | 0 refills | Status: DC

## 2017-03-17 NOTE — Telephone Encounter (Signed)
Routing to provider, FYI. Patient has appointment at 10:30.

## 2017-03-17 NOTE — Assessment & Plan Note (Signed)
BP is stable but orthostatic with BP 20 points lower.  Remember to check BP standing to make therapeutic decisions.

## 2017-03-17 NOTE — Patient Instructions (Signed)
Chair yoga °

## 2017-03-17 NOTE — Progress Notes (Signed)
BP 120/70   Pulse 81   Temp 98.4 F (36.9 C)   Wt 112 lb 3.2 oz (50.9 kg)   LMP  (LMP Unknown)   SpO2 98%   BMI 19.26 kg/m    Subjective:    Patient ID: Paula Thomas, female    DOB: 06-Oct-1931, 83 y.o.   MRN: [id number]  HPI: Paula Lialma Dean Fusaro is a 83 y.o. female  Chief Complaint  Patient presents with  . Hypertension  . Fall    pt's daughter states that the patient had a fall on Friday 03/14/17, went to ER, had x-rays but then sat for 5 hours according to patient's daughter so they left b/c daughter states the patient was cold, tired, hungry, and needed to take her medication   Pt is here with her daughter.  She went to the ER.    Falling Pt fell Saturday AM.  Micah FlesherWent to the ER but left before being seen.  Pain left lower rib cage with a fracture.  Increased care to 6 days a week and 8 hours/day.    Orthostasis Noting BP is 20 points lower with standing.    Relevant past medical, surgical, family and social history reviewed and updated as indicated. Interim medical history since our last visit reviewed. Allergies and medications reviewed and updated.  Review of Systems  Constitutional: Negative.   HENT: Negative.   Eyes: Negative.   Respiratory: Negative.   Cardiovascular: Negative.   Gastrointestinal: Negative.   Endocrine: Negative.   Genitourinary: Negative.   Musculoskeletal: Negative.   Skin: Negative.   Allergic/Immunologic: Negative.   Neurological: Negative.   Hematological: Negative.   Psychiatric/Behavioral: Negative.     Per HPI unless specifically indicated above     Objective:    BP 120/70   Pulse 81   Temp 98.4 F (36.9 C)   Wt 112 lb 3.2 oz (50.9 kg)   LMP  (LMP Unknown)   SpO2 98%   BMI 19.26 kg/m   Wt Readings from Last 3 Encounters:  03/17/17 112 lb 3.2 oz (50.9 kg)  03/15/17 112 lb (50.8 kg)  02/07/17 112 lb 9.6 oz (51.1 kg)    Physical Exam  Constitutional: She is oriented to person, place, and time. She appears  well-developed and well-nourished. No distress.  HENT:  Head: Normocephalic and atraumatic.  Eyes: Conjunctivae and lids are normal. Right eye exhibits no discharge. Left eye exhibits no discharge. No scleral icterus.  Neck: Normal range of motion. Neck supple. No JVD present. Carotid bruit is not present.  Cardiovascular: Normal rate, regular rhythm and normal heart sounds.   Pulmonary/Chest: Effort normal and breath sounds normal.  Abdominal: Normal appearance. There is no splenomegaly or hepatomegaly.  Musculoskeletal: Normal range of motion.  Neurological: She is alert and oriented to person, place, and time.  Skin: Skin is warm, dry and intact. No rash noted. No pallor.  Psychiatric: She has a normal mood and affect. Her behavior is normal. Judgment and thought content normal.    Results for orders placed or performed during the hospital encounter of 03/15/17  CBC  Result Value Ref Range   WBC 11.4 (H) 3.6 - 11.0 K/uL   RBC 4.28 3.80 - 5.20 MIL/uL   Hemoglobin 12.6 12.0 - 16.0 g/dL   HCT 40.938.5 81.135.0 - 91.447.0 %   MCV 90.0 80.0 - 100.0 fL   MCH 29.4 26.0 - 34.0 pg   MCHC 32.6 32.0 - 36.0 g/dL   RDW 78.213.8 95.611.5 -  14.5 %   Platelets 412 150 - 440 K/uL  Comprehensive metabolic panel  Result Value Ref Range   Sodium 137 135 - 145 mmol/L   Potassium 3.6 3.5 - 5.1 mmol/L   Chloride 98 (L) 101 - 111 mmol/L   CO2 28 22 - 32 mmol/L   Glucose, Bld 176 (H) 65 - 99 mg/dL   BUN 17 6 - 20 mg/dL   Creatinine, Ser 1.61 0.44 - 1.00 mg/dL   Calcium 9.2 8.9 - 09.6 mg/dL   Total Protein 7.9 6.5 - 8.1 g/dL   Albumin 4.3 3.5 - 5.0 g/dL   AST 33 15 - 41 U/L   ALT 22 14 - 54 U/L   Alkaline Phosphatase 108 38 - 126 U/L   Total Bilirubin 0.5 0.3 - 1.2 mg/dL   GFR calc non Af Amer 53 (L) >60 mL/min   GFR calc Af Amer >60 >60 mL/min   Anion gap 11 5 - 15  Troponin I  Result Value Ref Range   Troponin I <0.03 <0.03 ng/mL      Assessment & Plan:   Problem List Items Addressed This Visit       Unprioritized   Advance care planning    Advance care plans have been done and bring by a copy for [place] to scan.  Discussed DNR.  Pt does not want compressions but daughter not sure.  Risks of CPR discussed.  Form given and asked to bring it by for me to sign when she makes a decision      Benign hypertension with CKD (chronic kidney disease) stage I    BP is stable but orthostatic with BP 20 points lower.  Remember to check BP standing to make therapeutic decisions.        Risk for falls    Work on exercises and chair yoga.  Investigate a system which can detect falls.         Other Visit Diagnoses    Closed fracture of one rib of left side with routine healing, subsequent encounter    -  Primary   Ordered :idoderm patches       Follow up plan: Return if symptoms worsen or fail to improve.

## 2017-03-17 NOTE — Assessment & Plan Note (Signed)
Work on exercises and chair yoga.  Investigate a system which can detect falls.

## 2017-03-17 NOTE — Telephone Encounter (Signed)
Called [facility] to inform that pt came to ed and did not see provider.  She has rib rx from fall from possible syncopal episode.  They said she has appt today and will have their provider look at labs/xray done here.

## 2017-03-17 NOTE — Assessment & Plan Note (Addendum)
Advance care plans have been done and bring by a copy for us to scan.  Discussed DNR.  Pt does not want compressions but daughter not sure.  Risks of CPR discussed.  Form given and asked to bring it by for me to sign when she makes a decision

## 2017-04-15 ENCOUNTER — Telehealth: Payer: Self-pay | Admitting: Unknown Physician Specialty

## 2017-04-15 NOTE — Telephone Encounter (Signed)
Patients daughter dropped off form for pts handicap placard. Put in Cheryls basket.  Please call patients daughter when completed  Paula Thomas [phone]  Thanks

## 2017-04-16 NOTE — Telephone Encounter (Signed)
Called and left patient's daughter a VM letting her know that the form she dropped off was ready to be picked up.

## 2017-04-16 NOTE — Telephone Encounter (Signed)
Form signed by Elnita Maxwellheryl, copy placed in scan, and original up front for pick up. Will call patient's daughter shortly to let her know.

## 2017-04-27 ENCOUNTER — Emergency Department: Payer: Medicare Other

## 2017-04-27 ENCOUNTER — Encounter: Payer: Self-pay | Admitting: Emergency Medicine

## 2017-04-27 ENCOUNTER — Observation Stay
Admission: EM | Admit: 2017-04-27 | Discharge: 2017-04-28 | Disposition: A | Discharge status: Home / Self Care | Payer: Medicare Other | Attending: Specialist | Admitting: Specialist

## 2017-04-27 ENCOUNTER — Observation Stay: Payer: Medicare Other

## 2017-04-27 DIAGNOSIS — Z87891 Personal history of nicotine dependence: Secondary | ICD-10-CM | POA: Diagnosis not present

## 2017-04-27 DIAGNOSIS — Z90710 Acquired absence of both cervix and uterus: Secondary | ICD-10-CM | POA: Diagnosis not present

## 2017-04-27 DIAGNOSIS — F0390 Unspecified dementia without behavioral disturbance: Secondary | ICD-10-CM | POA: Insufficient documentation

## 2017-04-27 DIAGNOSIS — R296 Repeated falls: Secondary | ICD-10-CM | POA: Insufficient documentation

## 2017-04-27 DIAGNOSIS — Z79899 Other long term (current) drug therapy: Secondary | ICD-10-CM | POA: Diagnosis not present

## 2017-04-27 DIAGNOSIS — N183 Chronic kidney disease, stage 3 (moderate): Secondary | ICD-10-CM | POA: Diagnosis not present

## 2017-04-27 DIAGNOSIS — Z809 Family history of malignant neoplasm, unspecified: Secondary | ICD-10-CM | POA: Insufficient documentation

## 2017-04-27 DIAGNOSIS — E876 Hypokalemia: Secondary | ICD-10-CM | POA: Diagnosis not present

## 2017-04-27 DIAGNOSIS — T502X5A Adverse effect of carbonic-anhydrase inhibitors, benzothiadiazides and other diuretics, initial encounter: Secondary | ICD-10-CM | POA: Insufficient documentation

## 2017-04-27 DIAGNOSIS — Z7901 Long term (current) use of anticoagulants: Secondary | ICD-10-CM | POA: Insufficient documentation

## 2017-04-27 DIAGNOSIS — Z9081 Acquired absence of spleen: Secondary | ICD-10-CM | POA: Insufficient documentation

## 2017-04-27 DIAGNOSIS — Z888 Allergy status to other drugs, medicaments and biological substances status: Secondary | ICD-10-CM | POA: Insufficient documentation

## 2017-04-27 DIAGNOSIS — Z880 Allergy status to penicillin: Secondary | ICD-10-CM | POA: Diagnosis not present

## 2017-04-27 DIAGNOSIS — R636 Underweight: Secondary | ICD-10-CM | POA: Insufficient documentation

## 2017-04-27 DIAGNOSIS — I482 Chronic atrial fibrillation: Secondary | ICD-10-CM | POA: Insufficient documentation

## 2017-04-27 DIAGNOSIS — Z882 Allergy status to sulfonamides status: Secondary | ICD-10-CM | POA: Insufficient documentation

## 2017-04-27 DIAGNOSIS — I083 Combined rheumatic disorders of mitral, aortic and tricuspid valves: Secondary | ICD-10-CM | POA: Diagnosis not present

## 2017-04-27 DIAGNOSIS — Z8249 Family history of ischemic heart disease and other diseases of the circulatory system: Secondary | ICD-10-CM | POA: Insufficient documentation

## 2017-04-27 DIAGNOSIS — I493 Ventricular premature depolarization: Secondary | ICD-10-CM | POA: Diagnosis not present

## 2017-04-27 DIAGNOSIS — X58XXXA Exposure to other specified factors, initial encounter: Secondary | ICD-10-CM | POA: Insufficient documentation

## 2017-04-27 DIAGNOSIS — R55 Syncope and collapse: Principal | ICD-10-CM | POA: Insufficient documentation

## 2017-04-27 DIAGNOSIS — D631 Anemia in chronic kidney disease: Secondary | ICD-10-CM | POA: Diagnosis not present

## 2017-04-27 DIAGNOSIS — Y939 Activity, unspecified: Secondary | ICD-10-CM | POA: Insufficient documentation

## 2017-04-27 DIAGNOSIS — Z681 Body mass index (BMI) 19 or less, adult: Secondary | ICD-10-CM | POA: Diagnosis not present

## 2017-04-27 DIAGNOSIS — I129 Hypertensive chronic kidney disease with stage 1 through stage 4 chronic kidney disease, or unspecified chronic kidney disease: Secondary | ICD-10-CM | POA: Insufficient documentation

## 2017-04-27 DIAGNOSIS — W19XXXA Unspecified fall, initial encounter: Secondary | ICD-10-CM

## 2017-04-27 LAB — URINALYSIS, COMPLETE (UACMP) WITH MICROSCOPIC
Bacteria, UA: NONE SEEN
Bilirubin Urine: NEGATIVE
GLUCOSE, UA: NEGATIVE mg/dL
Ketones, ur: NEGATIVE mg/dL
Leukocytes, UA: NEGATIVE
NITRITE: NEGATIVE
PH: 7 (ref 5.0–8.0)
PROTEIN: 100 mg/dL — AB
SPECIFIC GRAVITY, URINE: 1.005 (ref 1.005–1.030)

## 2017-04-27 LAB — CBC WITH DIFFERENTIAL/PLATELET
BASOS ABS: 0.1 10*3/uL (ref 0–0.1)
BASOS PCT: 0 %
EOS ABS: 0 10*3/uL (ref 0–0.7)
EOS PCT: 0 %
HCT: 38.5 % (ref 35.0–47.0)
Hemoglobin: 12.7 g/dL (ref 12.0–16.0)
Lymphocytes Relative: 7 %
Lymphs Abs: 1.2 10*3/uL (ref 1.0–3.6)
MCH: 29.5 pg (ref 26.0–34.0)
MCHC: 33 g/dL (ref 32.0–36.0)
MCV: 89.5 fL (ref 80.0–100.0)
MONO ABS: 1 10*3/uL — AB (ref 0.2–0.9)
MONOS PCT: 6 %
Neutro Abs: 15.3 10*3/uL — ABNORMAL HIGH (ref 1.4–6.5)
Neutrophils Relative %: 87 %
PLATELETS: 374 10*3/uL (ref 150–440)
RBC: 4.3 MIL/uL (ref 3.80–5.20)
RDW: 13.8 % (ref 11.5–14.5)
WBC: 17.5 10*3/uL — ABNORMAL HIGH (ref 3.6–11.0)

## 2017-04-27 LAB — COMPREHENSIVE METABOLIC PANEL
ALK PHOS: 113 U/L (ref 38–126)
ALT: 29 U/L (ref 14–54)
AST: 69 U/L — AB (ref 15–41)
Albumin: 4.4 g/dL (ref 3.5–5.0)
Anion gap: 12 (ref 5–15)
BUN: 13 mg/dL (ref 6–20)
CALCIUM: 9.5 mg/dL (ref 8.9–10.3)
CHLORIDE: 95 mmol/L — AB (ref 101–111)
CO2: 28 mmol/L (ref 22–32)
CREATININE: 0.58 mg/dL (ref 0.44–1.00)
GFR calc Af Amer: >60 mL/min (ref >60)
Glucose, Bld: 119 mg/dL — ABNORMAL HIGH (ref 65–99)
Potassium: 3.1 mmol/L — ABNORMAL LOW (ref 3.5–5.1)
Sodium: 135 mmol/L (ref 135–145)
Total Bilirubin: 1.1 mg/dL (ref 0.3–1.2)
Total Protein: 8 g/dL (ref 6.5–8.1)

## 2017-04-27 LAB — PROTIME-INR
INR: 1.03
PROTHROMBIN TIME: 13.5 s (ref 11.4–15.2)

## 2017-04-27 LAB — TROPONIN I
TROPONIN I: 0.03 ng/mL — AB (ref <0.03)
Troponin I: 0.03 ng/mL (ref <0.03)

## 2017-04-27 LAB — TSH: TSH: 1.589 u[IU]/mL (ref 0.350–4.500)

## 2017-04-27 LAB — ETHANOL

## 2017-04-27 MED ORDER — HYDROCHLOROTHIAZIDE 12.5 MG PO CAPS
12.5000 mg | ORAL_CAPSULE | Freq: Every day | ORAL | Status: DC
  Administered 2017-04-27 – 2017-04-28 (×2): 12.5 mg via ORAL
  Filled 2017-04-27 (×2): qty 1

## 2017-04-27 MED ORDER — DILTIAZEM HCL ER COATED BEADS 120 MG PO CP24
240.0000 mg | ORAL_CAPSULE | Freq: Every day | ORAL | Status: DC
  Administered 2017-04-27 – 2017-04-28 (×2): 240 mg via ORAL
  Filled 2017-04-27 (×2): qty 2

## 2017-04-27 MED ORDER — APIXABAN 2.5 MG PO TABS
2.5000 mg | ORAL_TABLET | Freq: Two times a day (BID) | ORAL | Status: DC
  Administered 2017-04-27 – 2017-04-28 (×2): 2.5 mg via ORAL
  Filled 2017-04-27 (×2): qty 1

## 2017-04-27 MED ORDER — ACETAMINOPHEN 325 MG PO TABS: 650.0000 mg | ORAL_TABLET | Freq: Four times a day (QID) | ORAL | Status: DC | PRN

## 2017-04-27 MED ORDER — LIDOCAINE 5 % EX PTCH
1.0000 | MEDICATED_PATCH | CUTANEOUS | Status: DC
  Administered 2017-04-27: 1 via TRANSDERMAL
  Filled 2017-04-27 (×2): qty 1

## 2017-04-27 MED ORDER — LOSARTAN POTASSIUM 50 MG PO TABS
100.0000 mg | ORAL_TABLET | Freq: Every day | ORAL | Status: DC
  Administered 2017-04-27 – 2017-04-28 (×2): 100 mg via ORAL
  Filled 2017-04-27 (×2): qty 2

## 2017-04-27 MED ORDER — ONDANSETRON HCL 4 MG/2ML IJ SOLN: 4.0000 mg | Freq: Four times a day (QID) | INTRAMUSCULAR | Status: DC | PRN

## 2017-04-27 MED ORDER — ACETAMINOPHEN 650 MG RE SUPP: 650.0000 mg | Freq: Four times a day (QID) | RECTAL | Status: DC | PRN

## 2017-04-27 MED ORDER — VITAMIN D 1000 UNITS PO TABS
2000.0000 [IU] | ORAL_TABLET | Freq: Every day | ORAL | Status: DC
  Administered 2017-04-27 – 2017-04-28 (×2): 2000 [IU] via ORAL
  Filled 2017-04-27 (×2): qty 2

## 2017-04-27 MED ORDER — MEMANTINE HCL 5 MG PO TABS
5.0000 mg | ORAL_TABLET | Freq: Every day | ORAL | Status: DC
  Administered 2017-04-28: 5 mg via ORAL
  Filled 2017-04-27 (×2): qty 1

## 2017-04-27 MED ORDER — ONDANSETRON HCL 4 MG PO TABS: 4.0000 mg | ORAL_TABLET | Freq: Four times a day (QID) | ORAL | Status: DC | PRN

## 2017-04-27 MED ORDER — SODIUM CHLORIDE 0.9 % IV SOLN
INTRAVENOUS | Status: DC
  Administered 2017-04-27 – 2017-04-28 (×2): via INTRAVENOUS

## 2017-04-27 NOTE — ED Provider Notes (Addendum)
[facility] Emergency Department Provider Note  ____________________________________________   I have reviewed the triage vital signs and the nursing notes.   HISTORY  Chief Complaint Loss of Consciousness    HPI Paula Thomas is a 83 y.o. female who presents today complaining of hip pain, she is not exactly sure what happened this morning. She is very poor historian.Level 5 chart caveat; no further history available due to patient status. The patient states that she was on the ground. She is not sure how she got there she passed out. She does not know she hit her head. She complains of some pain to the right hip. She is brought in by EMS apparently after neighbors called. No further family members or other people are here for history.  Patient is on Eliquis for atrial fibrillation. She denies chest pain shortness of breath nausea or vomiting.   Past Medical History:  Diagnosis Date  . Atrial fibrillation (HCC)   . Fatigue   . Hypertension   . Syncope   . Underweight     Patient Active Problem List   Diagnosis Date Noted  . Advance care planning 02/07/2017  . Anemia 02/07/2017  . Hypocalcemia 11/15/2016  . Cognitive impairment 06/11/2016  . Wart 05/28/2016  . Sciatica 04/14/2015  . Weakness generalized 04/14/2015  . Risk for falls 04/14/2015  . Atrial fibrillation (HCC) 03/07/2015  . Syncope 03/07/2015  . Underweight 03/07/2015  . Hypertension 03/07/2015  . Osteoarthritis of left knee 03/07/2015  . Benign hypertension with CKD (chronic kidney disease) stage I 03/07/2015  . CKD (chronic kidney disease), stage III 03/07/2015    Past Surgical History:  Procedure Laterality Date  . ABDOMINAL HYSTERECTOMY    . EYE SURGERY Bilateral 2012   cataracts  . HERNIA REPAIR    . SPLENECTOMY    . TONSILLECTOMY      Prior to Admission medications   Medication Sig Start Date End Date Taking? Authorizing Provider  apixaban (ELIQUIS) 2.5 MG TABS  tablet Take 2.5 mg by mouth 2 (two) times daily. 03/07/16   [provider]  Cholecalciferol (VITAMIN D) 2000 units CAPS Take 2,000 Units by mouth daily.    [provider]  diltiazem (CARDIZEM CD) 240 MG 24 hr capsule Take 240 mg by mouth daily. 03/07/16   [provider]  hydrochlorothiazide (HYDRODIURIL) 25 MG tablet Take 1 tablet (25 mg total) by mouth daily. 07/29/16   Gabriel Cirri, NP  lidocaine (LIDODERM) 5 % Place 1 patch onto the skin daily. Remove & Discard patch within 12 hours or as directed by MD 03/17/17   Gabriel Cirri, NP  losartan (COZAAR) 100 MG tablet TAKE 1 TABLET BY MOUTH ONCE DAILY 11/25/16   Gabriel Cirri, NP  memantine (NAMENDA) 5 MG tablet Take 5 mg by mouth daily. 01/06/17 01/06/18  [provider]  Multiple Vitamin (MULTI-VITAMINS) TABS Take by mouth.    [provider]    Allergies Lisinopril; Penicillin g potassium [penicillin g]; and Sulfa antibiotics  Family History  Problem Relation Age of Onset  . Cancer Mother   . Cancer Father   . Heart attack Brother   . Heart attack Brother     Social History Social History  Substance Use Topics  . Smoking status: Former Games developer  . Smokeless tobacco: Never Used  . Alcohol use No    Review of Systems Constitutional: No fever/chills Eyes: No visual changes. ENT: No sore throat. No stiff neck no neck pain Cardiovascular: Denies chest  pain. Respiratory: Denies shortness of breath. Gastrointestinal:   no vomiting.  No diarrhea.  No constipation. Genitourinary: Negative for dysuria. Musculoskeletal: Negative lower extremity swelling Skin: Negative for rash. Neurological: Negative for severe headaches, focal weakness or numbness.   ____________________________________________   PHYSICAL EXAM:  VITAL SIGNS: ED Triage Vitals  Enc Vitals Group     BP 04/27/17 1200 (!) 182/101     Pulse Rate 04/27/17 1200 100     Resp 04/27/17 1200 18     Temp 04/27/17 1200 97.8 F  (36.6 C)     Temp Source 04/27/17 1200 Oral     SpO2 04/27/17 1200 97 %     Weight 04/27/17 1154 115 lb (52.2 kg)     Height 04/27/17 1154 5\' 3"  (1.6 m)     Head Circumference --      Peak Flow --      Pain Score 04/27/17 1153 5     Pain Loc --      Pain Edu? --      Excl. in GC? --     Constitutional: Alert and orientedTo name unsure of the date, knows the place where she has however. Well appearing and in no acute distress. Eyes: Conjunctivae are normal Head: Atraumatic HEENT: No congestion/rhinnorhea. Mucous membranes are moist.  Oropharynx non-erythematous Neck:   Nontender with no meningismus, no masses, no stridor Cardiovascular: Irregularly irregular rhythm. Grossly normal heart sounds.  Good peripheral circulation. Respiratory: Normal respiratory effort.  No retractions. Lungs CTAB. Abdominal: Soft and nontender. No distention. No guarding no rebound Back:  There is no focal tenderness or step off.  there is no midline tenderness there are no lesions noted. there is no CVA tenderness  Musculoskeletal: There is tenderness palpation to the right hip and pain with range of motion of the hip but there is no evidence of deformity or foreshortening, no knee pain,, no upper extremity tenderness. No joint effusions, no DVT signs strong distal pulses no edema Neurologic:  Normal speech and language. No gross focal neurologic deficits are appreciated.  Skin:  Skin is warm, dry and intact. No rash noted. Psychiatric: Mood and affect are normal. Speech and behavior are normal.  ____________________________________________   LABS (all labs ordered are listed, but only abnormal results are displayed)  Labs Reviewed  PROTIME-INR  CBC WITH DIFFERENTIAL/PLATELET  TROPONIN I  URINALYSIS, COMPLETE (UACMP) WITH MICROSCOPIC  COMPREHENSIVE METABOLIC PANEL  ETHANOL   ____________________________________________  EKG  I personally interpreted any EKGs ordered by me or triage afib rate  102 no acute ischemic changes normal access.  ____________________________________________  RADIOLOGY  I reviewed any imaging ordered by me or triage that were performed during my shift and, if possible, patient and/or family made aware of any abnormal findings. ____________________________________________   PROCEDURES  Procedure(s) performed: None  Procedures  Critical Care performed: None  ____________________________________________   INITIAL IMPRESSION / ASSESSMENT AND PLAN / ED COURSE  Pertinent labs & imaging results that were available during my care of the patient were reviewed by me and considered in my medical decision making (see chart for details).  Patient here after what is presumed to be a cecal fall with no recollection of the event with hip pain, we will obtain a CT scan of her head as she is on anticoagulation medication and is somewhat confused and is not clear exactly what her baseline is, we'll obtain a broad workup including a hip x-ray and we'll reassess. Patient in no acute distress declines pain  medicines this time  ----------------------------------------- 12:30 PM on 04/27/2017 -----------------------------------------  Family at bedside, they feel she is at her neuro baseline. Has dementia and memory issues. Seems normal to them.  Does have hx of falls, some of which are recollected.  ----------------------------------------- 2:26 PM on 04/27/2017 -----------------------------------------  Pt in nad has chronic sounding pain in her r hip and I can range it. Low suspicion for fx however, given osteopenia will scan it to ensure no acute injury. The patient has no cp no sob. She has had this multiple times and usually insists on going home. Trop is borderline and I will resend.     ____________________________________________   FINAL CLINICAL IMPRESSION(S) / ED DIAGNOSES  Final diagnoses:  Syncope      This chart was dictated using voice  recognition software.  Despite best efforts to proofread,  errors can occur which can change meaning.      Jeanmarie Plant, MD 04/27/17 1223    Jeanmarie Plant, MD 04/27/17 1231    Jeanmarie Plant, MD 04/27/17 [phone]

## 2017-04-27 NOTE — ED Notes (Signed)
Patient r/f US 

## 2017-04-27 NOTE — ED Notes (Signed)
Pt given 2nd warm blanket per her request. Pt's family at bedside at this time.

## 2017-04-27 NOTE — ED Notes (Signed)
This RN assisted patient up to toilet. Patient had difficult time getting out of bed and ambulating to toilet with my assistance even after moving bed close to toilet. Patient seemed to need two assistance upon ambulating. Daughter reports patient is suppose to use cane and walker at home but rarely does. Brief soaked once taken off to pee. Patient cleaned and brief changed by this RN

## 2017-04-27 NOTE — ED Notes (Signed)
Hospitalist bedside 

## 2017-04-27 NOTE — ED Notes (Signed)
Family updated on bed status.

## 2017-04-27 NOTE — ED Notes (Signed)
Patient transported to Ultrasound 

## 2017-04-27 NOTE — ED Notes (Signed)
Date and time results received: 04/27/17 1:20 PM   Test: Trop Critical Value: 0.03  Name of Provider Notified: Dr. Alphonzo Lemmings  Orders Received? Or Actions Taken?: Critical Result Acknowledged

## 2017-04-27 NOTE — ED Notes (Signed)
Patient still at [place], she will be sent to floor upon return

## 2017-04-27 NOTE — H&P (Signed)
[facility] [place] [facility]   PATIENT NAME: Paula Thomas    MR#:  [id number]  DATE OF BIRTH:  02-Sep-1932  DATE OF ADMISSION:  04/27/2017  PRIMARY CARE PHYSICIAN: Gabriel Cirri, NP   REQUESTING/REFERRING PHYSICIAN: Ileana Roup MD  CHIEF COMPLAINT:   Chief Complaint  Patient presents with  . Loss of Consciousness    HISTORY OF PRESENT ILLNESS: Paula Thomas  is a 83 y.o. female with a known history of Atrial fibrillation, essential hypertension and dementia who is presenting after a syncopal episode. Patient is unable to recall what happened but she states that she passed out and was on the ground. Patient started having pain in the right hip. She is had episodes of passing out at home multiple times. She denies any chest pain palpitations does feel dizzy from time to time    PAST MEDICAL HISTORY:   Past Medical History:  Diagnosis Date  . Atrial fibrillation (HCC)   . Fatigue   . Hypertension   . Syncope   . Underweight     PAST SURGICAL HISTORY: Past Surgical History:  Procedure Laterality Date  . ABDOMINAL HYSTERECTOMY    . EYE SURGERY Bilateral 2012   cataracts  . HERNIA REPAIR    . SPLENECTOMY    . TONSILLECTOMY      SOCIAL HISTORY:  Social History  Substance Use Topics  . Smoking status: Former Games developer  . Smokeless tobacco: Never Used  . Alcohol use No    FAMILY HISTORY:  Family History  Problem Relation Age of Onset  . Cancer Mother   . Cancer Father   . Heart attack Brother   . Heart attack Brother     DRUG ALLERGIES:  Allergies  Allergen Reactions  . Lisinopril Cough  . Penicillin G Potassium [Penicillin G]   . Sulfa Antibiotics     REVIEW OF SYSTEMS:   CONSTITUTIONAL: No fever, fatigue or weakness.  EYES: No blurred or double vision.  EARS, NOSE, AND THROAT: No tinnitus or ear pain.  RESPIRATORY: No cough, shortness of breath, wheezing or hemoptysis.  CARDIOVASCULAR: No chest pain, orthopnea,  edema.Positive Syncope  GASTROINTESTINAL: No nausea, vomiting, diarrhea or abdominal pain.  GENITOURINARY: No dysuria, hematuria.  ENDOCRINE: No polyuria, nocturia,  HEMATOLOGY: No anemia, easy bruising or bleeding SKIN: No rash or lesion. MUSCULOSKELETAL: No joint pain or arthritis.   NEUROLOGIC: No tingling, numbness, weakness.  PSYCHIATRY: No anxiety or depression.   MEDICATIONS AT HOME:  Prior to Admission medications   Medication Sig Start Date End Date Taking? Authorizing Provider  apixaban (ELIQUIS) 2.5 MG TABS tablet Take 2.5 mg by mouth 2 (two) times daily. 03/07/16  Yes [provider]  diltiazem (CARDIZEM CD) 240 MG 24 hr capsule Take 240 mg by mouth daily. 03/07/16  Yes [provider]  hydrochlorothiazide (MICROZIDE) 12.5 MG capsule Take 12.5 mg by mouth daily.   Yes [provider]  losartan (COZAAR) 100 MG tablet TAKE 1 TABLET BY MOUTH ONCE DAILY 11/25/16  Yes Gabriel Cirri, NP  memantine (NAMENDA) 5 MG tablet Take 5 mg by mouth daily. 01/06/17 01/06/18 Yes [provider]  Multiple Vitamin (MULTI-VITAMINS) TABS Take by mouth.   Yes [provider]  Cholecalciferol (VITAMIN D) 2000 units CAPS Take 2,000 Units by mouth daily.    [provider]  lidocaine (LIDODERM) 5 % Place 1 patch onto the skin daily. Remove & Discard patch within 12 hours or as directed by MD Patient not  taking: Reported on 04/27/2017 03/17/17   Gabriel Cirri, NP      PHYSICAL EXAMINATION:   VITAL SIGNS: Blood pressure (!) 160/92, pulse 94, temperature 97.8 F (36.6 C), temperature source Oral, resp. rate (!) 29, height 5\' 3"  (1.6 m), weight 115 lb (52.2 kg), SpO2 97 %.  GENERAL:  83 y.o.-year-old patient lying in the bed with no acute distress.  EYES: Pupils equal, round, reactive to light and accommodation. No scleral icterus. Extraocular muscles intact.  HEENT: Head atraumatic, normocephalic. Oropharynx and nasopharynx clear.  NECK:  Supple, no jugular  venous distention. No thyroid enlargement, no tenderness.  LUNGS: Normal breath sounds bilaterally, no wheezing, rales,rhonchi or crepitation. No use of accessory muscles of respiration.  CARDIOVASCULAR: S1, S2 normal. No murmurs, rubs, or gallops.  ABDOMEN: Soft, nontender, nondistended. Bowel sounds present. No organomegaly or mass.  EXTREMITIES: No pedal edema, cyanosis, or clubbing.  NEUROLOGIC: Cranial nerves II through XII are intact. Muscle strength 5/5 in all extremities. Sensation intact. Gait not checked.  PSYCHIATRIC: The patient is alert and oriented x 3.  SKIN: No obvious rash, lesion, or ulcer.   LABORATORY PANEL:   CBC  Recent Labs Lab 04/27/17 1159  WBC 17.5*  HGB 12.7  HCT 38.5  PLT 374  MCV 89.5  MCH 29.5  MCHC 33.0  RDW 13.8  LYMPHSABS 1.2  MONOABS 1.0*  EOSABS 0.0  BASOSABS 0.1   ------------------------------------------------------------------------------------------------------------------  Chemistries   Recent Labs Lab 04/27/17 1159  NA 135  K 3.1*  CL 95*  CO2 28  GLUCOSE 119*  BUN 13  CREATININE 0.58  CALCIUM 9.5  AST 69*  ALT 29  ALKPHOS 113  BILITOT 1.1   ------------------------------------------------------------------------------------------------------------------ estimated creatinine clearance is 44.7 mL/min (by C-G formula based on SCr of 0.58 mg/dL). ------------------------------------------------------------------------------------------------------------------ No results for input(s): TSH, T4TOTAL, T3FREE, THYROIDAB in the last 72 hours.  Invalid input(s): FREET3   Coagulation profile  Recent Labs Lab 04/27/17 1159  INR 1.03   ------------------------------------------------------------------------------------------------------------------- No results for input(s): DDIMER in the last 72  hours. -------------------------------------------------------------------------------------------------------------------  Cardiac Enzymes  Recent Labs Lab 04/27/17 1159 04/27/17 1430  TROPONINI 0.03* 0.03*   ------------------------------------------------------------------------------------------------------------------ Invalid input(s): POCBNP  ---------------------------------------------------------------------------------------------------------------  Urinalysis    Component Value Date/Time   COLORURINE STRAW (A) 04/27/2017 1200   APPEARANCEUR CLEAR (A) 04/27/2017 1200   LABSPEC 1.005 04/27/2017 1200   PHURINE 7.0 04/27/2017 1200   GLUCOSEU NEGATIVE 04/27/2017 1200   HGBUR MODERATE (A) 04/27/2017 1200   BILIRUBINUR NEGATIVE 04/27/2017 1200   KETONESUR NEGATIVE 04/27/2017 1200   PROTEINUR 100 (A) 04/27/2017 1200   NITRITE NEGATIVE 04/27/2017 1200   LEUKOCYTESUR NEGATIVE 04/27/2017 1200     RADIOLOGY: Ct Head Wo Contrast  Result Date: 04/27/2017 CLINICAL DATA:  Syncope, minor head trauma EXAM: CT HEAD WITHOUT CONTRAST TECHNIQUE: Contiguous axial images were obtained from the base of the skull through the vertex without intravenous contrast. COMPARISON:  01/01/2017 FINDINGS: Brain: No evidence of acute infarction, hemorrhage, hydrocephalus, extra-axial collection or mass lesion/mass effect. Mild cortical atrophy. Subcortical white matter and periventricular small vessel ischemic changes. Vascular: No hyperdense vessel or unexpected calcification. Skull: Normal. Negative for fracture or focal lesion. Sinuses/Orbits: The visualized paranasal sinuses are essentially clear. The mastoid air cells are unopacified. Other: None. IMPRESSION: No evidence of acute intracranial abnormality. Atrophy with small vessel ischemic changes. Electronically Signed   By: Charline Bills M.D.   On: 04/27/2017 12:41   Ct Hip Right Wo Contrast  Result Date: 04/27/2017 CLINICAL DATA:  Right hip  pain after falling this morning. Initial encounter. EXAM: CT OF THE RIGHT HIP WITHOUT CONTRAST TECHNIQUE: Multidetector CT imaging of the right hip was performed according to the standard protocol. Multiplanar CT image reconstructions were also generated. COMPARISON:  Radiographs same date. FINDINGS: Bones/Joint/Cartilage Examination is limited to the right hip and hemipelvis. The bones appear mildly demineralized. No evidence of acute displaced fracture or dislocation. There are no significant degenerative changes at the right hip. Mild degenerative changes are present at the symphysis pubis. There is evidence of a small right hip joint effusion. Ligaments Not relevant for exam/indication. Muscles and Tendons Unremarkable. Soft tissues No evidence of periarticular hematoma. There is mild iliofemoral atherosclerosis. IMPRESSION: 1. No evidence of displaced acute right hip fracture or dislocation. If the patient has persistent hip pain or inability to bear weight, follow up imaging may be warranted as acute hip fractures can be radiographically occult in the elderly (even by CT). 2. Small right hip joint effusion. 3. Of note, to my review of the earlier radiographs, there is possible asymmetric sclerosis of the left femoral head which could reflect avascular necrosis. The left hip is not included on this CT. Electronically Signed   By: Carey Bullocks M.D.   On: 04/27/2017 14:29   Dg Chest Port 1 View  Result Date: 04/27/2017 CLINICAL DATA:  Syncope EXAM: PORTABLE CHEST 1 VIEW COMPARISON:  03/15/2017 FINDINGS: 1245 hours. Lungs are hyperexpanded. The lungs are clear without focal pneumonia, edema, pneumothorax or pleural effusion. Interstitial markings are diffusely coarsened with chronic features. The cardio pericardial silhouette is enlarged. The visualized bony structures of the thorax are intact. Telemetry leads overlie the chest. IMPRESSION: Cardiomegaly with hyperexpansion and underlying chronic  interstitial changes. Electronically Signed   By: Kennith Center M.D.   On: 04/27/2017 13:06   Dg Hip Unilat With Pelvis 2-3 Views Right  Result Date: 04/27/2017 CLINICAL DATA:  Fall. EXAM: DG HIP (WITH OR WITHOUT PELVIS) 2-3V RIGHT COMPARISON:  None. FINDINGS: Bones are demineralized. Frontal pelvis shows no fracture. Joint space in the hips is symmetric and preserved. SI joints and symphysis pubis unremarkable. AP and cross-table lateral views of the right hip show no evidence for femoral neck fracture . IMPRESSION: Negative. Electronically Signed   By: Kennith Center M.D.   On: 04/27/2017 13:03    EKG: Orders placed or performed during the hospital encounter of 04/27/17  . ED EKG  . ED EKG  . EKG 12-Lead  . EKG 12-Lead    IMPRESSION AND PLAN: Patient is 83 year old presenting with possible syncope  1. Syncope and collapse  Monitor tele  Check echo  Check orthostatic  Cardiology consult 2. Hypokalemia- due to HCTZ therapy  Replace potassium  3. Chronic AFIB   Patient on chronic anticoagulation with liquids but has had multiple episodes of syncope             I will let his cardiologist discussed with the family and patient regarding possible discontinuation of this at high risk of hemorrhage Continue Cardizem  4. Essential hypertension continue therapy with HCTZ and losartan  5. MISC: DVT prophylaxis on full dose of anticoagulation   All the records are reviewed and case discussed with ED provider. Management plans discussed with the patient, family and they are in agreement.  CODE STATUS: Code Status History    This patient does not have a recorded code status. Please follow your organizational policy for patients in this situation.       TOTAL TIME TAKING CARE  OF THIS PATIENT: 55 minutes.    Auburn Bilberry M.D on 04/27/2017 at 5:08 PM  Between 7am to 6pm - Pager - [phone]  After 6pm go to [email or web address] - password [email or web address] [facility]  [facility]   Office  [phone]  CC: Primary care physician; Gabriel Cirri, NP

## 2017-04-27 NOTE — ED Triage Notes (Signed)
Pt presents to ED via [facility] for a syncopal episode. Per EMS it is unknown when patient had syncopal episode, when patient awoke she was on the floor and confused as to the time and date. EMS reports was found on the floor by her neighbors, pt normally wears a life alert but did not have it on today. Pt states she was getting out of bed when she had her syncopal episode. Pt c/o R sided pain to her hip and leg with bruising to L elbow and shin. EMS reports hx of A-fib, HTN, and dementia and that patient is taking Eliquis. Pt is alert and oriented to person, place, and situation, is disoriented to the month at this time but oriented to the year.

## 2017-04-28 ENCOUNTER — Observation Stay
Admit: 2017-04-28 | Discharge: 2017-04-28 | Disposition: A | Discharge status: Home / Self Care | Payer: Medicare Other | Attending: Internal Medicine | Admitting: Internal Medicine

## 2017-04-28 LAB — BASIC METABOLIC PANEL
ANION GAP: 9 (ref 5–15)
BUN: 12 mg/dL (ref 6–20)
CHLORIDE: 101 mmol/L (ref 101–111)
CO2: 27 mmol/L (ref 22–32)
Calcium: 9.2 mg/dL (ref 8.9–10.3)
Creatinine, Ser: 0.63 mg/dL (ref 0.44–1.00)
GFR calc Af Amer: >60 mL/min (ref >60)
GFR calc non Af Amer: >60 mL/min (ref >60)
GLUCOSE: 92 mg/dL (ref 65–99)
POTASSIUM: 3.3 mmol/L — AB (ref 3.5–5.1)
Sodium: 137 mmol/L (ref 135–145)

## 2017-04-28 LAB — CBC
HEMATOCRIT: 39.4 % (ref 35.0–47.0)
HEMOGLOBIN: 13.3 g/dL (ref 12.0–16.0)
MCH: 29.8 pg (ref 26.0–34.0)
MCHC: 33.6 g/dL (ref 32.0–36.0)
MCV: 88.5 fL (ref 80.0–100.0)
Platelets: 403 10*3/uL (ref 150–440)
RBC: 4.46 MIL/uL (ref 3.80–5.20)
RDW: 14.3 % (ref 11.5–14.5)
WBC: 14.3 10*3/uL — ABNORMAL HIGH (ref 3.6–11.0)

## 2017-04-28 LAB — ECHOCARDIOGRAM COMPLETE
Height: 65 in
Weight: 1678.4 oz

## 2017-04-28 MED ORDER — POTASSIUM CHLORIDE CRYS ER 20 MEQ PO TBCR
40.0000 meq | EXTENDED_RELEASE_TABLET | Freq: Once | ORAL | Status: AC
  Administered 2017-04-28: 40 meq via ORAL
  Filled 2017-04-28: qty 2

## 2017-04-28 NOTE — Care Management Obs Status (Signed)
MEDICARE OBSERVATION STATUS NOTIFICATION   Patient Details  Name: Paula Thomas MRN: [id number] Date of Birth: 1931/09/05   Medicare Observation Status Notification Given:  Yes    Collie Siad, RN 04/28/2017, 9:28 AM

## 2017-04-28 NOTE — Consult Note (Signed)
[facility] Cardiology  CARDIOLOGY CONSULT NOTE  Patient ID: Paula Thomas MRN: [id number] DOB/AGE: 10-10-1931 83 y.o.  Admit date: 04/27/2017 Referring Physician Allena Katz Primary Physician Gabriel Cirri, NP  Primary Cardiologist [facility] Reason for Consultation Atrial fibrillation  HPI: 83 year old female referred for evaluation of atrial fibrillation. The patient has a history of paroxysmal atrial fibrillation with a chads vasc score of 4, on Eliquis for stroke prevention, essential hypertension, and dementia. The patient reports she was in her usual state of health yesterday morning when she was making her bed, collapsed, and woke up behind the bedroom door. She cannot recall feeling lightheaded, dizzy, nauseous, or diaphoretic before collapsing. When she awoke, she did not know why she was behind the door. She got up, called her neighbor, who then called EMS. The patient complained of hip pain, but could not recall hitting her head, and there was reportedly no active bleeding. When she arrived to the ER, she was noted to be confused, but her baseline mental status was unknown. The patient states she has had other syncopal episodes in the past, most commonly occurring when she is particularly hot. She also has a history of falls. She uses a rolling walker at home and lives alone. Head CT negative for acute abnormalities. ECG revealed atrial fibrillation at a rate of 102 bpm without acute ischemic changes. Carotid ultrasound negative for significant stenosis. Admission labs notable for troponin 0.03 x 2. The patient denies chest pain or palpitations. She reports exertional dyspnea and bilateral ankle swelling, both of which are not new for her. She denies any recent illnesses or infections.  Review of systems complete and found to be negative unless listed above     Past Medical History:  Diagnosis Date  . Atrial fibrillation (HCC)   . Fatigue   . Hypertension   . Syncope   . Underweight      Past Surgical History:  Procedure Laterality Date  . ABDOMINAL HYSTERECTOMY    . EYE SURGERY Bilateral 2012   cataracts  . HERNIA REPAIR    . SPLENECTOMY    . TONSILLECTOMY      Prescriptions Prior to Admission  Medication Sig Dispense Refill Last Dose  . apixaban (ELIQUIS) 2.5 MG TABS tablet Take 2.5 mg by mouth 2 (two) times daily.   04/26/2017 at 1900  . diltiazem (CARDIZEM CD) 240 MG 24 hr capsule Take 240 mg by mouth daily.   04/26/2017 at 0800  . hydrochlorothiazide (MICROZIDE) 12.5 MG capsule Take 12.5 mg by mouth daily.   04/26/2017 at 0800  . losartan (COZAAR) 100 MG tablet TAKE 1 TABLET BY MOUTH ONCE DAILY 90 tablet 1 04/26/2017 at 0800  . memantine (NAMENDA) 5 MG tablet Take 5 mg by mouth daily.   04/26/2017 at 0800  . Multiple Vitamin (MULTI-VITAMINS) TABS Take by mouth.   04/26/2017 at 0800  . Cholecalciferol (VITAMIN D) 2000 units CAPS Take 2,000 Units by mouth daily.   Not Taking at Unknown time  . lidocaine (LIDODERM) 5 % Place 1 patch onto the skin daily. Remove & Discard patch within 12 hours or as directed by MD (Patient not taking: Reported on 04/27/2017) 10 patch 0 Not Taking at Unknown time   Social History   Social History  . Marital status: Widowed    Spouse name: N/A  . Number of children: N/A  . Years of education: N/A   Occupational History  . Not on file.   Social History Main Topics  . Smoking status: Former  Smoker  . Smokeless tobacco: Never Used  . Alcohol use No  . Drug use: No  . Sexual activity: No   Other Topics Concern  . Not on file   Social History Narrative  . No narrative on file    Family History  Problem Relation Age of Onset  . Cancer Mother   . Cancer Father   . Heart attack Brother   . Heart attack Brother       Review of systems complete and found to be negative unless listed above      PHYSICAL EXAM  General: Well developed, well nourished, in no acute distress HEENT:  Normocephalic and atramatic Neck:  No JVD.   Lungs: Clear bilaterally to auscultation. Normal effort of breathing. No wheezing, crackles, or rales Heart: Irregularly irregular. Abdomen: Bowel sounds are positive, abdomen soft and non-tender  Msk:  Strength and tone appear normal for her age. Gait not assessed. Patient lying in bed. Able to turn to side. Extremities: No clubbing, cyanosis or edema.   Neuro: Alert and oriented Psych:  Good affect, responds appropriately  Labs:   Lab Results  Component Value Date   WBC 14.3 (H) 04/28/2017   HGB 13.3 04/28/2017   HCT 39.4 04/28/2017   MCV 88.5 04/28/2017   PLT 403 04/28/2017    Recent Labs Lab 04/27/17 1159 04/28/17 0440  NA 135 137  K 3.1* 3.3*  CL 95* 101  CO2 28 27  BUN 13 12  CREATININE 0.58 0.63  CALCIUM 9.5 9.2  PROT 8.0  --   BILITOT 1.1  --   ALKPHOS 113  --   ALT 29  --   AST 69*  --   GLUCOSE 119* 92   Lab Results  Component Value Date   CKTOTAL 89 08/17/2014   CKMB 1.5 08/17/2014   TROPONINI 0.03 (HH) 04/27/2017    Lab Results  Component Value Date   CHOL 204 (H) 02/07/2017   CHOL 181 08/18/2014   Lab Results  Component Value Date   HDL 67 02/07/2017   HDL 63 (H) 08/18/2014   Lab Results  Component Value Date   LDLCALC 110 (H) 02/07/2017   LDLCALC 105 (H) 08/18/2014   Lab Results  Component Value Date   TRIG 133 02/07/2017   TRIG 66 08/18/2014   No results found for: CHOLHDL No results found for: LDLDIRECT    Radiology: Ct Head Wo Contrast  Result Date: 04/27/2017 CLINICAL DATA:  Syncope, minor head trauma EXAM: CT HEAD WITHOUT CONTRAST TECHNIQUE: Contiguous axial images were obtained from the base of the skull through the vertex without intravenous contrast. COMPARISON:  01/01/2017 FINDINGS: Brain: No evidence of acute infarction, hemorrhage, hydrocephalus, extra-axial collection or mass lesion/mass effect. Mild cortical atrophy. Subcortical white matter and periventricular small vessel ischemic changes. Vascular: No hyperdense vessel  or unexpected calcification. Skull: Normal. Negative for fracture or focal lesion. Sinuses/Orbits: The visualized paranasal sinuses are essentially clear. The mastoid air cells are unopacified. Other: None. IMPRESSION: No evidence of acute intracranial abnormality. Atrophy with small vessel ischemic changes. Electronically Signed   By: Charline Bills M.D.   On: 04/27/2017 12:41   Ct Hip Right Wo Contrast  Result Date: 04/27/2017 CLINICAL DATA:  Right hip pain after falling this morning. Initial encounter. EXAM: CT OF THE RIGHT HIP WITHOUT CONTRAST TECHNIQUE: Multidetector CT imaging of the right hip was performed according to the standard protocol. Multiplanar CT image reconstructions were also generated. COMPARISON:  Radiographs same date. FINDINGS: Bones/Joint/Cartilage  Examination is limited to the right hip and hemipelvis. The bones appear mildly demineralized. No evidence of acute displaced fracture or dislocation. There are no significant degenerative changes at the right hip. Mild degenerative changes are present at the symphysis pubis. There is evidence of a small right hip joint effusion. Ligaments Not relevant for exam/indication. Muscles and Tendons Unremarkable. Soft tissues No evidence of periarticular hematoma. There is mild iliofemoral atherosclerosis. IMPRESSION: 1. No evidence of displaced acute right hip fracture or dislocation. If the patient has persistent hip pain or inability to bear weight, follow up imaging may be warranted as acute hip fractures can be radiographically occult in the elderly (even by CT). 2. Small right hip joint effusion. 3. Of note, to my review of the earlier radiographs, there is possible asymmetric sclerosis of the left femoral head which could reflect avascular necrosis. The left hip is not included on this CT. Electronically Signed   By: Carey Bullocks M.D.   On: 04/27/2017 14:29   US Carotid Bilateral  Result Date: 04/27/2017 CLINICAL DATA:  Syncopal  episode EXAM: BILATERAL CAROTID DUPLEX ULTRASOUND TECHNIQUE: Wallace Cullens scale imaging, color Doppler and duplex ultrasound were performed of bilateral carotid and vertebral arteries in the neck. COMPARISON:  None. FINDINGS: Criteria: Quantification of carotid stenosis is based on velocity parameters that correlate the residual internal carotid diameter with NASCET-based stenosis levels, using the diameter of the distal internal carotid lumen as the denominator for stenosis measurement. The following velocity measurements were obtained: RIGHT ICA:  75/18 cm/sec CCA:  57/9 cm/sec SYSTOLIC ICA/CCA RATIO:  1.3 DIASTOLIC ICA/CCA RATIO:  1.9 ECA:  64 cm/sec LEFT ICA:  98/22 cm/sec CCA:  54/9 cm/sec SYSTOLIC ICA/CCA RATIO:  1.8 DIASTOLIC ICA/CCA RATIO:  2.3 ECA:  63 cm/sec RIGHT CAROTID ARTERY: Preliminary grayscale images demonstrate scattered calcifications. The velocities, waveforms and flow velocity ratios however demonstrate no evidence of focal hemodynamically significant stenosis. RIGHT VERTEBRAL ARTERY:  Antegrade in nature. LEFT CAROTID ARTERY: Preliminary grayscale images demonstrate atherosclerotic calcifications. The waveforms, velocities and flow velocity ratios however show no evidence of focal hemodynamically significant stenosis. LEFT VERTEBRAL ARTERY:  Antegrade in nature. IMPRESSION: Bilateral atherosclerotic plaque without evidence of focal hemodynamically significant stenosis. Electronically Signed   By: Alcide Clever M.D.   On: 04/27/2017 17:31   Dg Chest Port 1 View  Result Date: 04/27/2017 CLINICAL DATA:  Syncope EXAM: PORTABLE CHEST 1 VIEW COMPARISON:  03/15/2017 FINDINGS: 1245 hours. Lungs are hyperexpanded. The lungs are clear without focal pneumonia, edema, pneumothorax or pleural effusion. Interstitial markings are diffusely coarsened with chronic features. The cardio pericardial silhouette is enlarged. The visualized bony structures of the thorax are intact. Telemetry leads overlie the chest.  IMPRESSION: Cardiomegaly with hyperexpansion and underlying chronic interstitial changes. Electronically Signed   By: Kennith Center M.D.   On: 04/27/2017 13:06   Dg Hip Unilat With Pelvis 2-3 Views Right  Result Date: 04/27/2017 CLINICAL DATA:  Fall. EXAM: DG HIP (WITH OR WITHOUT PELVIS) 2-3V RIGHT COMPARISON:  None. FINDINGS: Bones are demineralized. Frontal pelvis shows no fracture. Joint space in the hips is symmetric and preserved. SI joints and symphysis pubis unremarkable. AP and cross-table lateral views of the right hip show no evidence for femoral neck fracture . IMPRESSION: Negative. Electronically Signed   By: Kennith Center M.D.   On: 04/27/2017 13:03    EKG: Atrial fibrillation  ASSESSMENT AND PLAN:  1. Syncope and collapse, carotid ultrasound negative for significant stenosis, head CT negative for acute abnormality, echocardiogram pending.  Patient reports history of syncope, with 2-3 per year 2. Atrial fibrillation, chads vasc score of 4, on Eliquis 2.5 mg BID for stroke prevention, currently rate controlled, appears asymptomatic. 3. Essential hypertension, mildly elevated this morning 4. Dementia  Recommendations: 1. Continue current therapy. 2. Review 2D echocardiogram 3. Recommend continuing low-dose Eliquis for stroke prevention for now with chads vasc score of 4 as risks outweigh the benefits 4. Further recommendations pending patient's initial course and results of echo  Signed: Leanora Ivanoff, PA-C 04/28/2017, 7:57 AM

## 2017-04-28 NOTE — Clinical Social Work Note (Signed)
CSW received referral for SNF.  Case discussed with case manager and plan is to discharge home with home health PT.  CSW to sign off please re-consult if social work needs arise.  Ervin Knack. Cordell Coke, MSW, [employer] [phone]

## 2017-04-28 NOTE — Care Management (Signed)
RNCM notified by MD that PT had shared with RN patient would benefit from SNF/rehab. I spoke with patient's son and he has shared that he doubts his mother/patient would agree however they are seeking long-term care placement for patient. She has caregiver  4-5 hours/day private duty prior to admission. They have increased private duty to 24/7 per son.  Patient lives alone at baseline; Uses a walker and cane when reminded. Per Barbara Cower with [facility] they can accept patient for home health PT only- son updated and agrees.

## 2017-04-28 NOTE — Progress Notes (Signed)
[employer] [facility] [place] at [facility]   PATIENT NAME: Paula Thomas    MR#:  [id number]  DATE OF BIRTH:  Apr 29, 1932  SUBJECTIVE:   Patient is here due to a syncopal episode. No further episodes of syncope overnight. Patient remains pleasantly confused.  REVIEW OF SYSTEMS:    Review of Systems  Unable to perform ROS: Mental acuity    Nutrition: heart Healthy Tolerating Diet: Yes Tolerating PT: Await Eval.   DRUG ALLERGIES:   Allergies  Allergen Reactions  . Lisinopril Cough  . Penicillin G Potassium [Penicillin G]   . Sulfa Antibiotics     VITALS:  Blood pressure 123/62, pulse (!) 55, temperature (!) 97.3 F (36.3 C), resp. rate 19, height 5\' 5"  (1.651 m), weight 47.6 kg (104 lb 14.4 oz), SpO2 97 %.  PHYSICAL EXAMINATION:   Physical Exam  GENERAL:  83 y.o.-year-old patient lying in bed in no acute distress.  EYES: Pupils equal, round, reactive to light and accommodation. No scleral icterus. Extraocular muscles intact.  HEENT: Head atraumatic, normocephalic. Oropharynx and nasopharynx clear.  NECK:  Supple, no jugular venous distention. No thyroid enlargement, no tenderness.  LUNGS: Normal breath sounds bilaterally, no wheezing, rales, rhonchi. No use of accessory muscles of respiration.  CARDIOVASCULAR: S1, S2 normal. No murmurs, rubs, or gallops.  ABDOMEN: Soft, nontender, nondistended. Bowel sounds present. No organomegaly or mass.  EXTREMITIES: No cyanosis, clubbing or edema b/l.    NEUROLOGIC: Cranial nerves II through XII are intact. No focal Motor or sensory deficits b/l.   PSYCHIATRIC: The patient is alert and oriented x 1. SKIN: No obvious rash, lesion, or ulcer.    LABORATORY PANEL:   CBC  Recent Labs Lab 04/28/17 0440  WBC 14.3*  HGB 13.3  HCT 39.4  PLT 403   ------------------------------------------------------------------------------------------------------------------  Chemistries   Recent Labs Lab 04/27/17 1159  04/28/17 0440  NA 135 137  K 3.1* 3.3*  CL 95* 101  CO2 28 27  GLUCOSE 119* 92  BUN 13 12  CREATININE 0.58 0.63  CALCIUM 9.5 9.2  AST 69*  --   ALT 29  --   ALKPHOS 113  --   BILITOT 1.1  --    ------------------------------------------------------------------------------------------------------------------  Cardiac Enzymes  Recent Labs Lab 04/27/17 1430  TROPONINI 0.03*   ------------------------------------------------------------------------------------------------------------------  RADIOLOGY:  Ct Head Wo Contrast  Result Date: 04/27/2017 CLINICAL DATA:  Syncope, minor head trauma EXAM: CT HEAD WITHOUT CONTRAST TECHNIQUE: Contiguous axial images were obtained from the base of the skull through the vertex without intravenous contrast. COMPARISON:  01/01/2017 FINDINGS: Brain: No evidence of acute infarction, hemorrhage, hydrocephalus, extra-axial collection or mass lesion/mass effect. Mild cortical atrophy. Subcortical white matter and periventricular small vessel ischemic changes. Vascular: No hyperdense vessel or unexpected calcification. Skull: Normal. Negative for fracture or focal lesion. Sinuses/Orbits: The visualized paranasal sinuses are essentially clear. The mastoid air cells are unopacified. Other: None. IMPRESSION: No evidence of acute intracranial abnormality. Atrophy with small vessel ischemic changes. Electronically Signed   By: Charline Bills M.D.   On: 04/27/2017 12:41   Ct Hip Right Wo Contrast  Result Date: 04/27/2017 CLINICAL DATA:  Right hip pain after falling this morning. Initial encounter. EXAM: CT OF THE RIGHT HIP WITHOUT CONTRAST TECHNIQUE: Multidetector CT imaging of the right hip was performed according to the standard protocol. Multiplanar CT image reconstructions were also generated. COMPARISON:  Radiographs same date. FINDINGS: Bones/Joint/Cartilage Examination is limited to the right hip and hemipelvis. The bones appear mildly demineralized.  No  evidence of acute displaced fracture or dislocation. There are no significant degenerative changes at the right hip. Mild degenerative changes are present at the symphysis pubis. There is evidence of a small right hip joint effusion. Ligaments Not relevant for exam/indication. Muscles and Tendons Unremarkable. Soft tissues No evidence of periarticular hematoma. There is mild iliofemoral atherosclerosis. IMPRESSION: 1. No evidence of displaced acute right hip fracture or dislocation. If the patient has persistent hip pain or inability to bear weight, follow up imaging may be warranted as acute hip fractures can be radiographically occult in the elderly (even by CT). 2. Small right hip joint effusion. 3. Of note, to my review of the earlier radiographs, there is possible asymmetric sclerosis of the left femoral head which could reflect avascular necrosis. The left hip is not included on this CT. Electronically Signed   By: Carey Bullocks M.D.   On: 04/27/2017 14:29   US Carotid Bilateral  Result Date: 04/27/2017 CLINICAL DATA:  Syncopal episode EXAM: BILATERAL CAROTID DUPLEX ULTRASOUND TECHNIQUE: Wallace Cullens scale imaging, color Doppler and duplex ultrasound were performed of bilateral carotid and vertebral arteries in the neck. COMPARISON:  None. FINDINGS: Criteria: Quantification of carotid stenosis is based on velocity parameters that correlate the residual internal carotid diameter with NASCET-based stenosis levels, using the diameter of the distal internal carotid lumen as the denominator for stenosis measurement. The following velocity measurements were obtained: RIGHT ICA:  75/18 cm/sec CCA:  57/9 cm/sec SYSTOLIC ICA/CCA RATIO:  1.3 DIASTOLIC ICA/CCA RATIO:  1.9 ECA:  64 cm/sec LEFT ICA:  98/22 cm/sec CCA:  54/9 cm/sec SYSTOLIC ICA/CCA RATIO:  1.8 DIASTOLIC ICA/CCA RATIO:  2.3 ECA:  63 cm/sec RIGHT CAROTID ARTERY: Preliminary grayscale images demonstrate scattered calcifications. The velocities, waveforms and  flow velocity ratios however demonstrate no evidence of focal hemodynamically significant stenosis. RIGHT VERTEBRAL ARTERY:  Antegrade in nature. LEFT CAROTID ARTERY: Preliminary grayscale images demonstrate atherosclerotic calcifications. The waveforms, velocities and flow velocity ratios however show no evidence of focal hemodynamically significant stenosis. LEFT VERTEBRAL ARTERY:  Antegrade in nature. IMPRESSION: Bilateral atherosclerotic plaque without evidence of focal hemodynamically significant stenosis. Electronically Signed   By: Alcide Clever M.D.   On: 04/27/2017 17:31   Dg Chest Port 1 View  Result Date: 04/27/2017 CLINICAL DATA:  Syncope EXAM: PORTABLE CHEST 1 VIEW COMPARISON:  03/15/2017 FINDINGS: 1245 hours. Lungs are hyperexpanded. The lungs are clear without focal pneumonia, edema, pneumothorax or pleural effusion. Interstitial markings are diffusely coarsened with chronic features. The cardio pericardial silhouette is enlarged. The visualized bony structures of the thorax are intact. Telemetry leads overlie the chest. IMPRESSION: Cardiomegaly with hyperexpansion and underlying chronic interstitial changes. Electronically Signed   By: Kennith Center M.D.   On: 04/27/2017 13:06   Dg Hip Unilat With Pelvis 2-3 Views Right  Result Date: 04/27/2017 CLINICAL DATA:  Fall. EXAM: DG HIP (WITH OR WITHOUT PELVIS) 2-3V RIGHT COMPARISON:  None. FINDINGS: Bones are demineralized. Frontal pelvis shows no fracture. Joint space in the hips is symmetric and preserved. SI joints and symphysis pubis unremarkable. AP and cross-table lateral views of the right hip show no evidence for femoral neck fracture . IMPRESSION: Negative. Electronically Signed   By: Kennith Center M.D.   On: 04/27/2017 13:03     ASSESSMENT AND PLAN:   83 yo female w/ hx of chronic a. Fib, HTN, dementia presents to the hospital due to a fall and suspected syncope.  1. Syncope/Fall-etiology unclear presently. Workup so far has been  negative.  Patient CT head is negative for acute pathology, she is not orthostatic. -No alarms on telemetry, echocardiogram pending, carotid duplex showing no evidence of hemodynamically significant carotid stenosis.  2. History of chronic atrial fibrillation-rate controlled. Continue Cardizem. - Continue Eliquis for now as per cardiology. If patient continues to have falls would consider discontinuing long-term anticoagulation.  3. Recurrent Falls - seen by PT and will await input.  - ??  Home with 24 hr care (vs) STR/SNF and case management made aware.   4. HTN - cont. Cardizem, Losartan, HCTZ.   Discussed plan of care with patient's son over the phone also with the case manager.  All the records are reviewed and case discussed with Care Management/Social Worker. Management plans discussed with the patient, family and they are in agreement.  CODE STATUS: Full   DVT Prophylaxis: Eliquis  TOTAL TIME TAKING CARE OF THIS PATIENT: 30 minutes.   POSSIBLE D/C IN 1-2 DAYS, DEPENDING ON CLINICAL CONDITION.   Houston Siren M.D on 04/28/2017 at 3:52 PM  Between 7am to 6pm - Pager - [phone]  After 6pm go to [email or web address] - Scientist, research (life sciences) [facility]  Office  [phone]  CC: Primary care physician; Gabriel Cirri, NP

## 2017-04-28 NOTE — Progress Notes (Signed)
Notified MD Sheryle Hail of potassium 3.3; no new orders received. Nursing staff will continue to monitor for any changes in patient status. Lamonte Richer, RN

## 2017-04-28 NOTE — Progress Notes (Signed)
*  PRELIMINARY RESULTS* Echocardiogram 2D Echocardiogram has been performed.  Cristela Blue 04/28/2017, 1:28 PM

## 2017-04-28 NOTE — Evaluation (Signed)
Physical Therapy Evaluation Patient Details Name: Paula Thomas MRN: [id number] DOB: 1932-07-05 Today's Date: 04/28/2017   History of Present Illness  Pt is an 83 y.o. female presenting to hospital after loss of conscious (pt found on ground) and R hip pain.  Imaging negative for R hip fx.  Pt admitted with syncopal episode.  PMH includes a-fib on Eliquis, htn, dementia/memory issues, sciatica.  Clinical Impression  Prior to hospital admission, pt was ambulating modified independently with rollator.  Pt lives alone but had 5 hours of caregiver assist 6 days a week (pt's son reports plan to increase assist levels to 24/7 upon discharge).  Currently pt is mod assist supine to sit; min to mod assist with transfers using RW (posterior lean/loss of balance noted each time standing requiring assist to maintain upright); and min assist to ambulate 30 feet with RW (pt with antalgic gait and unsteady).  R hip pain 6-8/10 with ambulation.  Orthostatic VS taken during session and were negative (nursing notified; see flow-sheet for details).  Pt would benefit from skilled PT to address noted impairments and functional limitations (see below for any additional details).  Discussed required level of assist required for safe discharge home but pt's son unable to verify physical assist was available (vs 24/7 supervision); nursing and SW notified.  Upon hospital discharge, currently recommend pt discharge to STR.    Follow Up Recommendations SNF    Equipment Recommendations  Rolling walker with 5" wheels    Recommendations for Other Services       Precautions / Restrictions Precautions Precautions: Fall Restrictions Weight Bearing Restrictions: No      Mobility  Bed Mobility Overal bed mobility: Needs Assistance Bed Mobility: Supine to Sit     Supine to sit: Mod assist;HOB elevated     General bed mobility comments: assist for trunk supine to sit; increased effort and time to perform; vc's for  technique required and pt required heavy use of bedrail  Transfers Overall transfer level: Needs assistance Equipment used: Rolling walker (2 wheeled) Transfers: Sit to/from [employer] Sit to Stand: Min assist;Mod assist Stand pivot transfers: Min assist;Mod assist       General transfer comment: pt initially required 3 attempts (with assist) to come into standing with use of RW (from bed); pt also required assist to stand from recliner; vc's and tactile cues required for UE and LE positioning; increased effort and time to come to stand; assist to initiate and come to full upright stand  Ambulation/Gait Ambulation/Gait assistance: Min assist Ambulation Distance (Feet): 30 Feet Assistive device: Rolling walker (2 wheeled)   Gait velocity: decreased   General Gait Details: decreased stance time R LE; antalgic; decreased L LE step length; intermittent vc's required to increase UE support through RW to off-weight R LE d/t pain  Stairs            Wheelchair Mobility    Modified Rankin (Stroke Patients Only)       Balance Overall balance assessment: Needs assistance;History of Falls Sitting-balance support: Bilateral upper extremity supported;Feet supported Sitting balance-Leahy Scale: Fair Sitting balance - Comments: static sitting   Standing balance support: Bilateral upper extremity supported (on RW) Standing balance-Leahy Scale: Poor Standing balance comment: posterior lean noted in standing each time standing requiring assist to shift weight forward                             Pertinent Vitals/Pain Pain Assessment:  0-10 Pain Score: 8  (0/10 pain resting in bed beginning of session;  6-8/10 R hip pain with walking; 5/10 pain end of session sitting in chair) Pain Location: R hip Pain Descriptors / Indicators: Sore;Aching;Grimacing Pain Intervention(s): Limited activity within patient's tolerance;Monitored during session;Repositioned (RN  notified regarding pt's pain)    Home Living Family/patient expects to be discharged to:: Private residence Living Arrangements: Alone Available Help at Discharge: Family;Personal care attendant Type of Home: House Home Access: Stairs to enter Entrance Stairs-Rails: Doctor, general practice of Steps: 4 Home Layout: One level Home Equipment: Environmental consultant - 4 wheels;Shower seat;Grab bars - tub/shower      Prior Function Level of Independence: Needs assistance   Gait / Transfers Assistance Needed: Pt modified independent ambulating with rollator.  ADL's / Homemaking Assistance Needed: Has caregiver 6 days a week for 5 hours a day (son comes on the 7th day to assist pt)  Comments: Pt's son reports plan to increase assist to 24/7 but short term (for 2 weeks)     Hand Dominance        Extremity/Trunk Assessment   Upper Extremity Assessment Upper Extremity Assessment: Generalized weakness    Lower Extremity Assessment Lower Extremity Assessment:  (L LE generalized weakness; R hip flexion 3-/5 (limited d/t pain); R knee flexion/extension at least 3+/5; R DF at least 3+/5)    Cervical / Trunk Assessment Cervical / Trunk Assessment: Normal  Communication   Communication: No difficulties  Cognition Arousal/Alertness: Awake/alert Behavior During Therapy: WFL for tasks assessed/performed Overall Cognitive Status:  (Oriented to person, place)                                        General Comments General comments (skin integrity, edema, etc.): Pt's son present intermittently during session.  Nursing cleared pt for participation in physical therapy.  Pt agreeable to PT session.    Exercises  Transfer and gait training.   Assessment/Plan    PT Assessment Patient needs continued PT services  PT Problem List Decreased strength;Decreased activity tolerance;Decreased balance;Decreased mobility;Decreased knowledge of use of DME;Decreased knowledge of  precautions;Pain       PT Treatment Interventions DME instruction;Gait training;Stair training;Functional mobility training;Therapeutic activities;Therapeutic exercise;Balance training;Patient/family education    PT Goals (Current goals can be found in the Care Plan section)  Acute Rehab PT Goals Patient Stated Goal: to go home PT Goal Formulation: With patient Time For Goal Achievement: 05/12/17 Potential to Achieve Goals: Fair    Frequency Min 2X/week   Barriers to discharge Decreased caregiver support      Co-evaluation               AM-PAC PT "6 Clicks" Daily Activity  Outcome Measure Difficulty turning over in bed (including adjusting bedclothes, sheets and blankets)?: Unable Difficulty moving from lying on back to sitting on the side of the bed? : Unable Difficulty sitting down on and standing up from a chair with arms (e.g., wheelchair, bedside commode, etc,.)?: Unable Help needed moving to and from a bed to chair (including a wheelchair)?: A Lot Help needed walking in hospital room?: A Little Help needed climbing 3-5 steps with a railing? : A Lot 6 Click Score: 10    End of Session Equipment Utilized During Treatment: Gait belt Activity Tolerance: Patient limited by fatigue;Patient limited by pain Patient left: in chair;with call bell/phone within reach;with chair alarm set;with family/visitor present Nurse  Communication: Mobility status;Precautions (Pt's pain status) PT Visit Diagnosis: Other abnormalities of gait and mobility (R26.89);Muscle weakness (generalized) (M62.81);History of falling (Z91.81);Pain Pain - Right/Left: Right Pain - part of body: Hip    Time: 1440-1530 PT Time Calculation (min) (ACUTE ONLY): 50 min   Charges:   PT Evaluation $PT Eval Low Complexity: 1 Low PT Treatments $Gait Training: 8-22 mins $Therapeutic Activity: 8-22 mins   PT G Codes:   PT G-Codes **NOT FOR INPATIENT CLASS** Functional Assessment Tool Used: AM-PAC 6 Clicks  Basic Mobility Functional Limitation: Mobility: Walking and moving around Mobility: Walking and Moving Around Current Status (T3428): At least 60 percent but less than 80 percent impaired, limited or restricted Mobility: Walking and Moving Around Goal Status [phone]): At least 1 percent but less than 20 percent impaired, limited or restricted    Hendricks Limes, PT 04/28/17, 4:30 PM [phone]

## 2017-04-29 NOTE — Discharge Summary (Signed)
[facility] [place] at [facility]   PATIENT NAME: Paula Thomas    MR#:  [id number]  DATE OF BIRTH:  29-Aug-1932  DATE OF ADMISSION:  04/27/2017 ADMITTING PHYSICIAN: Auburn Bilberry, MD  DATE OF DISCHARGE: 04/28/2017  5:36 PM  PRIMARY CARE PHYSICIAN: Gabriel Cirri, NP    ADMISSION DIAGNOSIS:  Syncope and collapse [R55] Syncope [R55] Fall [W19.XXXA]  DISCHARGE DIAGNOSIS:  Active Problems:   Syncope and collapse   SECONDARY DIAGNOSIS:   Past Medical History:  Diagnosis Date  . Atrial fibrillation (HCC)   . Fatigue   . Hypertension   . Syncope   . Underweight     HOSPITAL COURSE:   83 year old female with past medical history of hypertension, atrial fibrillation, mild dementia who presented to the hospital due to syncope/fall.  1. Syncope/fall-patient was admitted to the hospital under observation.she noted extensive workup including CT head which was negative for acute pathology, she also underwent echocardiogram which showed normal ejection fraction, carotid duplex shows no evidence of hemodynamic significant carotid stenosis. She's had no arrhythmias on telemetry. -She was seen by physical therapy and recommended home health services it was arranged for the patient prior to discharge. This causes her syncope was likely vasovagal in nature.  2. Chronic atrial fibrillation-patient remained rate controlled. She will continue her Cardizem. -Patient was seen by cardiology who recommended continuing Eliquis for now, but if she were to get recurrent falls despite having higher level of care or having 24 hour care at home and continues to have falls this needs to be reconsidered. -continue follow-up with cardiology as outpatient.  3. Essential hypertension-patient will continue her Cardizem, losartan/HCTZ.  She was seen by physical therapy and recommended home health services and therefore patient was discharged with home health physical therapy. Patient's  son already has around-the-clock care for her at home.  DISCHARGE CONDITIONS:   Stable.   CONSULTS OBTAINED:  Treatment Team:  Marcina Millard, MD Houston Siren, MD  DRUG ALLERGIES:   Allergies  Allergen Reactions  . Lisinopril Cough  . Penicillin G Potassium [Penicillin G]   . Sulfa Antibiotics     DISCHARGE MEDICATIONS:   Allergies as of 04/28/2017      Reactions   Lisinopril Cough   Penicillin G Potassium [penicillin G]    Sulfa Antibiotics       Medication List    STOP taking these medications   lidocaine 5 % Commonly known as:  LIDODERM     TAKE these medications   diltiazem 240 MG 24 hr capsule Commonly known as:  CARDIZEM CD Take 240 mg by mouth daily.   ELIQUIS 2.5 MG Tabs tablet Generic drug:  apixaban Take 2.5 mg by mouth 2 (two) times daily.   hydrochlorothiazide 12.5 MG capsule Commonly known as:  MICROZIDE Take 12.5 mg by mouth daily.   losartan 100 MG tablet Commonly known as:  COZAAR TAKE 1 TABLET BY MOUTH ONCE DAILY   memantine 5 MG tablet Commonly known as:  NAMENDA Take 5 mg by mouth daily.   MULTI-VITAMINS Tabs Take by mouth.   Vitamin D 2000 units Caps Take 2,000 Units by mouth daily.            Discharge Care Instructions        Start     Ordered   04/28/17 0000  Activity as tolerated - No restrictions     04/28/17 1627   04/28/17 0000  Diet - low sodium heart healthy  04/28/17 1627        DISCHARGE INSTRUCTIONS:   DIET:  Cardiac diet  DISCHARGE CONDITION:  Stable  ACTIVITY:  Activity as tolerated  OXYGEN:  Home Oxygen: No.   Oxygen Delivery: room air  DISCHARGE LOCATION:  Home with Home Health PT.    If you experience worsening of your admission symptoms, develop shortness of breath, life threatening emergency, suicidal or homicidal thoughts you must seek medical attention immediately by calling 911 or calling your MD immediately  if symptoms less severe.  You Must read complete  instructions/literature along with all the possible adverse reactions/side effects for all the Medicines you take and that have been prescribed to you. Take any new Medicines after you have completely understood and accpet all the possible adverse reactions/side effects.   Please note  You were cared for by a hospitalist during your hospital stay. If you have any questions about your discharge medications or the care you received while you were in the hospital after you are discharged, you can call the unit and asked to speak with the hospitalist on call if the hospitalist that took care of you is not available. Once you are discharged, your primary care physician will handle any further medical issues. Please note that NO REFILLS for any discharge medications will be authorized once you are discharged, as it is imperative that you return to your primary care physician (or establish a relationship with a primary care physician if you do not have one) for your aftercare needs so that they can reassess your need for medications and monitor your lab values.   DATA REVIEW:   CBC  Recent Labs Lab 04/28/17 0440  WBC 14.3*  HGB 13.3  HCT 39.4  PLT 403    Chemistries   Recent Labs Lab 04/27/17 1159 04/28/17 0440  NA 135 137  K 3.1* 3.3*  CL 95* 101  CO2 28 27  GLUCOSE 119* 92  BUN 13 12  CREATININE 0.58 0.63  CALCIUM 9.5 9.2  AST 69*  --   ALT 29  --   ALKPHOS 113  --   BILITOT 1.1  --     Cardiac Enzymes  Recent Labs Lab 04/27/17 1430  TROPONINI 0.03*    Microbiology Results  No results found for this or any previous visit.  RADIOLOGY:  US Carotid Bilateral  Result Date: 04/27/2017 CLINICAL DATA:  Syncopal episode EXAM: BILATERAL CAROTID DUPLEX ULTRASOUND TECHNIQUE: Wallace Cullens scale imaging, color Doppler and duplex ultrasound were performed of bilateral carotid and vertebral arteries in the neck. COMPARISON:  None. FINDINGS: Criteria: Quantification of carotid stenosis is  based on velocity parameters that correlate the residual internal carotid diameter with NASCET-based stenosis levels, using the diameter of the distal internal carotid lumen as the denominator for stenosis measurement. The following velocity measurements were obtained: RIGHT ICA:  75/18 cm/sec CCA:  57/9 cm/sec SYSTOLIC ICA/CCA RATIO:  1.3 DIASTOLIC ICA/CCA RATIO:  1.9 ECA:  64 cm/sec LEFT ICA:  98/22 cm/sec CCA:  54/9 cm/sec SYSTOLIC ICA/CCA RATIO:  1.8 DIASTOLIC ICA/CCA RATIO:  2.3 ECA:  63 cm/sec RIGHT CAROTID ARTERY: Preliminary grayscale images demonstrate scattered calcifications. The velocities, waveforms and flow velocity ratios however demonstrate no evidence of focal hemodynamically significant stenosis. RIGHT VERTEBRAL ARTERY:  Antegrade in nature. LEFT CAROTID ARTERY: Preliminary grayscale images demonstrate atherosclerotic calcifications. The waveforms, velocities and flow velocity ratios however show no evidence of focal hemodynamically significant stenosis. LEFT VERTEBRAL ARTERY:  Antegrade in nature. IMPRESSION: Bilateral atherosclerotic plaque without  evidence of focal hemodynamically significant stenosis. Electronically Signed   By: Alcide Clever M.D.   On: 04/27/2017 17:31      Management plans discussed with the patient, family and they are in agreement.  CODE STATUS:  Code Status History    Date Active Date Inactive Code Status Order ID Comments User Context   04/27/2017  5:35 PM 04/28/2017  8:41 PM Full Code [id number]  Auburn Bilberry, MD Inpatient    TOTAL TIME TAKING CARE OF THIS PATIENT: 40 minutes.    Houston Siren M.D on 04/29/2017 at 4:03 PM  Between 7am to 6pm - Pager - [phone]  After 6pm go to [email or web address] - Scientist, research (life sciences) [facility]  Office  [phone]  CC: Primary care physician; Gabriel Cirri, NP

## 2017-04-30 ENCOUNTER — Telehealth: Payer: Self-pay

## 2017-04-30 NOTE — Telephone Encounter (Addendum)
Transition Care Management Follow-up Telephone Call   Spoke with Ms.Costnervand home health caregiver for the questions and spoke with patients son to make appointment.  Date discharged? 04/29/2017  How have you been since you were released from the hospital? Feeling a little better, but still aching.   Do you understand why you were in the hospital? Yes  Do you understand the discharge instructions? Yes  Where were you discharged to? Home   Items Reviewed: Medications reviewed: YES Allergies reviewed: YES Dietary changes reviewed: YES Referrals reviewed: YES   Functional Questionnaire:  Activities of Daily Living (ADLs):   states they are independent in the following: feeding States they require assistance with the following: on 24 hour care from home health, assisted with bathing, transporting, ambulating  Any transportation issues/concerns?: no  Any patient concerns? no  Confirmed importance and date/time of follow-up visits scheduled: Yes Appointment: 05/06/2017 at 3:00pm with Phineas Inchesheryl Wicker,NP Confirmed with patient if condition begins to worsen call PCP or go to the ER.  Patient was given the office number and encouraged to call back with question or concerns.  : YES

## 2017-05-06 ENCOUNTER — Encounter: Payer: Self-pay | Admitting: Emergency Medicine

## 2017-05-06 ENCOUNTER — Emergency Department
Admission: EM | Admit: 2017-05-06 | Discharge: 2017-05-06 | Disposition: A | Discharge status: Home / Self Care | Payer: Medicare Other | Attending: Emergency Medicine | Admitting: Emergency Medicine

## 2017-05-06 ENCOUNTER — Encounter: Payer: Self-pay | Admitting: Unknown Physician Specialty

## 2017-05-06 ENCOUNTER — Ambulatory Visit (INDEPENDENT_AMBULATORY_CARE_PROVIDER_SITE_OTHER): Payer: Medicare Other | Admitting: Unknown Physician Specialty

## 2017-05-06 DIAGNOSIS — R55 Syncope and collapse: Secondary | ICD-10-CM | POA: Insufficient documentation

## 2017-05-06 DIAGNOSIS — Z9181 History of falling: Secondary | ICD-10-CM

## 2017-05-06 DIAGNOSIS — I129 Hypertensive chronic kidney disease with stage 1 through stage 4 chronic kidney disease, or unspecified chronic kidney disease: Secondary | ICD-10-CM | POA: Insufficient documentation

## 2017-05-06 DIAGNOSIS — N181 Chronic kidney disease, stage 1: Secondary | ICD-10-CM

## 2017-05-06 DIAGNOSIS — R636 Underweight: Secondary | ICD-10-CM

## 2017-05-06 DIAGNOSIS — N183 Chronic kidney disease, stage 3 (moderate): Secondary | ICD-10-CM | POA: Diagnosis not present

## 2017-05-06 DIAGNOSIS — I4891 Unspecified atrial fibrillation: Secondary | ICD-10-CM

## 2017-05-06 DIAGNOSIS — Z87891 Personal history of nicotine dependence: Secondary | ICD-10-CM | POA: Diagnosis not present

## 2017-05-06 DIAGNOSIS — Z79899 Other long term (current) drug therapy: Secondary | ICD-10-CM | POA: Insufficient documentation

## 2017-05-06 LAB — CBC WITH DIFFERENTIAL/PLATELET
Basophils Absolute: 0.1 10*3/uL (ref 0–0.1)
Basophils Relative: 1 %
EOS ABS: 0.1 10*3/uL (ref 0–0.7)
EOS PCT: 1 %
HCT: 36.6 % (ref 35.0–47.0)
Hemoglobin: 12.3 g/dL (ref 12.0–16.0)
LYMPHS PCT: 20 %
Lymphs Abs: 1.9 10*3/uL (ref 1.0–3.6)
MCH: 30.4 pg (ref 26.0–34.0)
MCHC: 33.5 g/dL (ref 32.0–36.0)
MCV: 90.7 fL (ref 80.0–100.0)
MONO ABS: 0.8 10*3/uL (ref 0.2–0.9)
Monocytes Relative: 9 %
Neutro Abs: 6.5 10*3/uL (ref 1.4–6.5)
Neutrophils Relative %: 69 %
Platelets: 429 10*3/uL (ref 150–440)
RBC: 4.04 MIL/uL (ref 3.80–5.20)
RDW: 14.6 % — AB (ref 11.5–14.5)
WBC: 9.4 10*3/uL (ref 3.6–11.0)

## 2017-05-06 LAB — BASIC METABOLIC PANEL
ANION GAP: 10 (ref 5–15)
BUN: 15 mg/dL (ref 6–20)
CALCIUM: 8.6 mg/dL — AB (ref 8.9–10.3)
CO2: 27 mmol/L (ref 22–32)
Chloride: 100 mmol/L — ABNORMAL LOW (ref 101–111)
Creatinine, Ser: 0.88 mg/dL (ref 0.44–1.00)
GFR calc Af Amer: >60 mL/min (ref >60)
GFR, EST NON AFRICAN AMERICAN: 60 mL/min — AB (ref >60)
Glucose, Bld: 101 mg/dL — ABNORMAL HIGH (ref 65–99)
Potassium: 3.3 mmol/L — ABNORMAL LOW (ref 3.5–5.1)
SODIUM: 137 mmol/L (ref 135–145)

## 2017-05-06 LAB — URINALYSIS, COMPLETE (UACMP) WITH MICROSCOPIC
Bacteria, UA: NONE SEEN
Bilirubin Urine: NEGATIVE
GLUCOSE, UA: NEGATIVE mg/dL
Hgb urine dipstick: NEGATIVE
Ketones, ur: NEGATIVE mg/dL
Nitrite: NEGATIVE
PH: 6 (ref 5.0–8.0)
Protein, ur: NEGATIVE mg/dL
SPECIFIC GRAVITY, URINE: 1.012 (ref 1.005–1.030)
SQUAMOUS EPITHELIAL / LPF: NONE SEEN

## 2017-05-06 LAB — TROPONIN I: Troponin I: <0.03 ng/mL (ref <0.03)

## 2017-05-06 NOTE — Progress Notes (Signed)
BP 107/66   Pulse 77   Temp 98.6 F (37 C)   Wt 110 lb 3.2 oz (50 kg)   LMP  (LMP Unknown)   SpO2 97%   BMI 18.34 kg/m    Subjective:    Patient ID: Paula Thomas, female    DOB: 02-Aug-1932, 83 y.o.   MRN: [id number]  HPI: Paula Thomas is a 83 y.o. female  Chief Complaint  Patient presents with  . Hospitalization Follow-up   Pt was admitted to the hospital 04/27/17 for syncope.  After an extensive work-up including head CT, carotic duplex and echocardiogram, it was determined to be related to vasovagal causes.  She was discharged with home health and PT.  Her family has arranged for her to have 24 hour care at home and has committed to a 3 week trial.    A-Fib Discharged from the hospital rate controlled  Hypertension Using medications without difficulty.  Suggestion from [facility] to decrease Losartan from 100 mg to 50 mg.   Average home BPs 112-130   No problems or lightheadedness No chest pain with exertion or shortness of breath No Edema  Syncope While in the office, she had an event in which she stared in the distance, not able to respond, head fell forward.  Stopped breathing for a few minutes until neck straightened.  Called EMS.  She regained consciousness and was at baseline.    Relevant past medical, surgical, family and social history reviewed and updated as indicated. Interim medical history since our last visit reviewed. Allergies and medications reviewed and updated.  Review of Systems  Per HPI unless specifically indicated above     Objective:    BP 107/66   Pulse 77   Temp 98.6 F (37 C)   Wt 110 lb 3.2 oz (50 kg)   LMP  (LMP Unknown)   SpO2 97%   BMI 18.34 kg/m   Wt Readings from Last 3 Encounters:  05/06/17 110 lb 3.2 oz (50 kg)  04/27/17 104 lb 14.4 oz (47.6 kg)  03/17/17 112 lb 3.2 oz (50.9 kg)    Physical Exam  Constitutional: She is oriented to person, place, and time. She appears well-developed and well-nourished.  No distress.  HENT:  Head: Normocephalic and atraumatic.  Eyes: Conjunctivae and lids are normal. Right eye exhibits no discharge. Left eye exhibits no discharge. No scleral icterus.  Neck: Normal range of motion. Neck supple. No JVD present. Carotid bruit is not present.  Cardiovascular: Normal rate, regular rhythm and normal heart sounds.   Pulmonary/Chest: Effort normal and breath sounds normal.  Abdominal: Normal appearance. There is no splenomegaly or hepatomegaly.  Musculoskeletal: Normal range of motion.  Neurological: She is alert and oriented to person, place, and time.  Skin: Skin is warm, dry and intact. No rash noted. No pallor.  Psychiatric: She has a normal mood and affect. Her behavior is normal. Judgment and thought content normal.      Assessment & Plan:   Problem List Items Addressed This Visit      Unprioritized   Atrial fibrillation (HCC)    Rate controlled      Benign hypertension with CKD (chronic kidney disease) stage I    OK to decrease Losartan from 100 to 50 mg.  Better to keep her BP a little higher than too low causing syncope      Relevant Orders   Ambulatory referral to Cardiology   Risk for falls  Has a gait belt on.  24 hour care for at least the next 3 weeks.        Syncope and collapse    One event in the office.  Called EMS.  Back to baseline after about 10 minutes.  To the ER for evaluation.    Harriett Sine her caregiver described the same scenario.  Will make a neurology referral and cardiology referral      Relevant Orders   Ambulatory referral to Cardiology   Ambulatory referral to Neurology   Underweight    Eating frequent small meals          Follow up plan: Return in about 4 weeks (around 06/03/2017).

## 2017-05-06 NOTE — Discharge Instructions (Signed)
Please seek medical attention for any high fevers, chest pain, shortness of breath, change in behavior, persistent vomiting, bloody stool or any other new or concerning symptoms.  

## 2017-05-06 NOTE — Assessment & Plan Note (Signed)
Eating frequent small meals

## 2017-05-06 NOTE — ED Provider Notes (Signed)
[facility] Emergency Department Provider Note  ____________________________________________   I have reviewed the triage vital signs and the nursing notes.   HISTORY  Chief Complaint Loss of Consciousness   History limited by: Dementia   HPI Paula Thomas is a 83 y.o. female who presents to the emergency department today from a doctor's office after a syncopal episode. Per EMS the patient was sitting up when she slumped over and became unresponsive. Per doctor's note stated last a few minutes. The patient does have a history of syncopal episodes and was discharged from the hospital roughly 1 week ago after an extensive syncope workup. Patient herself denies any pain. She denies any chest pain or shortness of breath. She denies any recent fevers.  Past Medical History:  Diagnosis Date  . Atrial fibrillation (HCC)   . Fatigue   . Hypertension   . Syncope   . Underweight     Patient Active Problem List   Diagnosis Date Noted  . Syncope and collapse 04/27/2017  . Advance care planning 02/07/2017  . Anemia 02/07/2017  . Hypocalcemia 11/15/2016  . Cognitive impairment 06/11/2016  . Wart 05/28/2016  . Sciatica 04/14/2015  . Weakness generalized 04/14/2015  . Risk for falls 04/14/2015  . Atrial fibrillation (HCC) 03/07/2015  . Syncope 03/07/2015  . Underweight 03/07/2015  . Hypertension 03/07/2015  . Osteoarthritis of left knee 03/07/2015  . Benign hypertension with CKD (chronic kidney disease) stage I 03/07/2015  . CKD (chronic kidney disease), stage III 03/07/2015    Past Surgical History:  Procedure Laterality Date  . ABDOMINAL HYSTERECTOMY    . EYE SURGERY Bilateral 2012   cataracts  . HERNIA REPAIR    . SPLENECTOMY    . TONSILLECTOMY      Prior to Admission medications   Medication Sig Start Date End Date Taking? Authorizing Provider  acetaminophen (TYLENOL 8 HOUR ARTHRITIS PAIN) 650 MG CR tablet Take 650 mg by mouth every 8 (eight)  hours as needed for pain.    [provider]  apixaban (ELIQUIS) 2.5 MG TABS tablet Take 2.5 mg by mouth 2 (two) times daily. 03/07/16   [provider]  Cholecalciferol (VITAMIN D3 PO) Take 50 mcg by mouth daily.    [provider]  diltiazem (CARDIZEM CD) 240 MG 24 hr capsule Take 240 mg by mouth daily. 03/07/16   [provider]  hydrochlorothiazide (MICROZIDE) 12.5 MG capsule Take 12.5 mg by mouth daily.    [provider]  losartan (COZAAR) 100 MG tablet TAKE 1 TABLET BY MOUTH ONCE DAILY 11/25/16   Gabriel CirriWicker, Cheryl, NP  memantine (NAMENDA) 5 MG tablet Take 5 mg by mouth daily. 01/06/17 01/06/18  [provider]  Multiple Vitamins-Minerals (HM MULTIVITAMIN ADULT GUMMY PO) Take by mouth daily.    [provider]    Allergies Lisinopril; Penicillin g potassium [penicillin g]; and Sulfa antibiotics  Family History  Problem Relation Age of Onset  . Cancer Mother   . Cancer Father   . Heart attack Brother   . Heart attack Brother     Social History Social History  Substance Use Topics  . Smoking status: Former Games developermoker  . Smokeless tobacco: Never Used  . Alcohol use No    Review of Systems Constitutional: No fever/chills Eyes: No visual changes. ENT: No sore throat. Cardiovascular: Denies chest pain. Respiratory: Denies shortness of breath. Gastrointestinal: No abdominal pain.  No nausea, no vomiting.  No diarrhea.   Genitourinary: Negative for dysuria. Musculoskeletal:  Negative for back pain. Skin: Negative for rash. Neurological: Negative for headaches, focal weakness or numbness.  ____________________________________________   PHYSICAL EXAM:  VITAL SIGNS: ED Triage Vitals  Enc Vitals Group     BP 05/06/17 1635 (!) 157/89     Pulse Rate 05/06/17 1635 77     Resp 05/06/17 1635 16     Temp 05/06/17 1635 97.8 F (36.6 C)     Temp Source 05/06/17 1635 Oral     SpO2 05/06/17 1635 98 %     Weight 05/06/17 1631 110 lb  (49.9 kg)     Height 05/06/17 1631 5\' 5"  (1.651 m)    Constitutional: Alert and oriented. Well appearing and in no distress. Eyes: Conjunctivae are normal.  ENT   Head: Normocephalic and atraumatic.   Nose: No congestion/rhinnorhea.   Mouth/Throat: Mucous membranes are moist.   Neck: No stridor. Hematological/Lymphatic/Immunilogical: No cervical lymphadenopathy. Cardiovascular: irregularly irregular rhythm.  No murmurs, rubs, or gallops.  Respiratory: Normal respiratory effort without tachypnea nor retractions. Breath sounds are clear and equal bilaterally. No wheezes/rales/rhonchi. Gastrointestinal: Soft and non tender. No rebound. No guarding.  Genitourinary: Deferred Musculoskeletal: Normal range of motion in all extremities. No lower extremity edema. Neurologic:  Normal speech and language. No gross focal neurologic deficits are appreciated.  Skin:  Skin is warm, dry and intact. No rash noted. Psychiatric: Mood and affect are normal. Speech and behavior are normal. Patient exhibits appropriate insight and judgment.  ____________________________________________    LABS (pertinent positives/negatives)  Labs Reviewed  CBC WITH DIFFERENTIAL/PLATELET - Abnormal; Notable for the following:       Result Value   RDW 14.6 (*)    All other components within normal limits  BASIC METABOLIC PANEL - Abnormal; Notable for the following:    Potassium 3.3 (*)    Chloride 100 (*)    Glucose, Bld 101 (*)    Calcium 8.6 (*)    GFR calc non Af Amer 60 (*)    All other components within normal limits  URINALYSIS, COMPLETE (UACMP) WITH MICROSCOPIC - Abnormal; Notable for the following:    Color, Urine YELLOW (*)    APPearance CLEAR (*)    Leukocytes, UA TRACE (*)    All other components within normal limits  TROPONIN I     ____________________________________________   EKG  I, Phineas Semen, attending physician, personally viewed and interpreted this EKG  EKG Time:  1633 Rate: 77 Rhythm: atrial fibrillation Axis: normal Intervals: qtc 476 QRS: narrow ST changes: no st elevation Impression: abnormal ekg   ____________________________________________    RADIOLOGY  None  ____________________________________________   PROCEDURES  Procedures  ____________________________________________   INITIAL IMPRESSION / ASSESSMENT AND PLAN / ED COURSE  Pertinent labs & imaging results that were available during my care of the patient were reviewed by me and considered in my medical decision making (see chart for details).  Patient presented to the emergency department today after a syncopal episode. Patient does have a history of syncopal episodes and recently was admitted to the hospital for the same. Blood work and urine without concerning findings. I did talk to the patient and son about getting a second troponin however at this point given the patient has had significant workup for the syncope and has never had any concerning elevation of the troponin I do not think there would be a lot of value in repeating it. I did discuss however that it was a heart attack I would expect an elevation on the  second. Patient and family did feel comfortable without further testing at this time. I think this is completely reasonable. Will discharge follow-up with primary care.  ____________________________________________   FINAL CLINICAL IMPRESSION(S) / ED DIAGNOSES  Final diagnoses:  Syncope, unspecified syncope type     Note: This dictation was prepared with Dragon dictation. Any transcriptional errors that result from this process are unintentional     Phineas Semen, MD 05/06/17 [phone]

## 2017-05-06 NOTE — ED Notes (Signed)
Pt. Verbalizes understanding of d/c instructions and follow-up. VS stable and pain controlled per pt.  Pt. In NAD at time of d/c and denies further concerns regarding this visit. Pt. Stable at the time of departure from the unit, departing unit by the safest and most appropriate manner per that pt condition and limitations. Pt advised to return to the ED at any time for emergent concerns, or for new/worsening symptoms.   

## 2017-05-06 NOTE — Assessment & Plan Note (Signed)
Has a gait belt on.  24 hour care for at least the next 3 weeks.

## 2017-05-06 NOTE — ED Triage Notes (Signed)
Patient to ED via [facility]. Per EMS, patient was at PCP when she had a syncopal episode and reported respiratory arrest. Per PCP, episode lasted approx 5-10 seconds. Upon arrival to ED, patient is A&O x4. Patient reports history of similar episodes. Patient denies pain.

## 2017-05-06 NOTE — Assessment & Plan Note (Signed)
OK to decrease Losartan from 100 to 50 mg.  Better to keep her BP a little higher than too low causing syncope

## 2017-05-06 NOTE — Assessment & Plan Note (Signed)
Rate controlled 

## 2017-05-06 NOTE — Assessment & Plan Note (Addendum)
One event in the office.  Called EMS.  Back to baseline after about 10 minutes.  To the ER for evaluation.    Harriett SineNancy her caregiver described the same scenario.  Will make a neurology referral and cardiology referral

## 2017-05-08 ENCOUNTER — Telehealth: Payer: Self-pay | Admitting: Unknown Physician Specialty

## 2017-05-08 MED ORDER — LOSARTAN POTASSIUM 50 MG PO TABS: 50.0000 mg | ORAL_TABLET | Freq: Every day | ORAL | 3 refills | Status: DC

## 2017-05-08 NOTE — Telephone Encounter (Signed)
Francine GravenNancy Caudle called regarding the patient Losartan per caregiver Elnita MaxwellCheryl was going to give the patient a new script on Tues but due to the complications after patient arrived for the appointment this was not taken care of.  Can Elnita MaxwellCheryl write a new script for Harriett Sineancy to pick up tomorrow for this patient.  It needs to be half of the dose she is currently taking.  Harriett Sineancy  [phone]  Thank You

## 2017-05-08 NOTE — Telephone Encounter (Signed)
Routing to provider. Did not mark medication for refill because of dose change per Harriett SineNancy.

## 2017-05-09 ENCOUNTER — Telehealth: Payer: Self-pay | Admitting: Unknown Physician Specialty

## 2017-05-09 NOTE — Telephone Encounter (Signed)
Routing to provider  

## 2017-05-09 NOTE — Telephone Encounter (Signed)
Called and let Emily know that Cheryl gave the OK for verbal orders.  

## 2017-05-09 NOTE — Telephone Encounter (Signed)
Nancy from [facility] would like to know if she could pick up the prescription for losartan for patient.   Please Advise.  Thank you

## 2017-05-09 NOTE — Telephone Encounter (Signed)
OK 

## 2017-05-09 NOTE — Telephone Encounter (Signed)
Called and let Harriett Sineancy know that the patient's prescription was sent to [facility] yesterday afternoon. Harriett Sineancy stated that she would let the patient's son know.

## 2017-05-12 ENCOUNTER — Telehealth: Payer: Self-pay | Admitting: Unknown Physician Specialty

## 2017-05-12 NOTE — Telephone Encounter (Signed)
Discussed with daughter about her "episodes."  Discussed that we are referring to neurology for evaluation of possible seizure.    Leg pain.  Discussed intermittent left leg pain.  Discussed supportive care and I don't recommend MRI at this time.    Discussed long-term care options.

## 2017-05-12 NOTE — Telephone Encounter (Signed)
Paula Thomas would you like to call this patient's daughter to speak about the patient's health?

## 2017-05-13 ENCOUNTER — Telehealth: Payer: Self-pay | Admitting: Unknown Physician Specialty

## 2017-05-13 NOTE — Telephone Encounter (Signed)
Elmer BalesEmily Parker with [facility] called to let Paula Thomas know that patient had another syncopal episode over the weekend and they are hoping Paula Thomas could expediate the appointment with cardiology if at all possible.  She also is still having right leg pain and has trouble putting any weight on it. Tylenol is not helping with the pain.  [phone]  Thanks

## 2017-05-14 NOTE — Telephone Encounter (Signed)
Call Cardiology and say you have an urgent referral. They will schedule with you over the phone. Let them know this is a Care Everywhere patient for notes.

## 2017-05-14 NOTE — Telephone Encounter (Signed)
Called and left patient's son a VM asking for him to please return my call.

## 2017-05-14 NOTE — Telephone Encounter (Signed)
Called and spoke with patient's son. He states that he is aware of the cardiology appointment tomorrow because they called him about it.

## 2017-05-14 NOTE — Telephone Encounter (Signed)
Keri- what can I do as far as getting this referral to Dr. Lady GaryFath? Do I need to fax stuff to them or what? Cheryl (Dr. Laural BenesJohnson), please advise other things in the message,

## 2017-05-14 NOTE — Telephone Encounter (Signed)
Called cardiology and scheduled the patient an appointment with them tomorrow at 2:00 pm. Called and left Irving Burtonmily a VM asking for her to please return my call.

## 2017-05-21 ENCOUNTER — Telehealth: Payer: Self-pay | Admitting: Unknown Physician Specialty

## 2017-05-21 NOTE — Telephone Encounter (Signed)
[facility] would like to do an orthopedic consult. Daughter would like to know how they can go about doing that for patient in her home.   Please Advise.  Thank you

## 2017-05-22 ENCOUNTER — Telehealth: Payer: Self-pay | Admitting: Unknown Physician Specialty

## 2017-05-22 DIAGNOSIS — M25551 Pain in right hip: Secondary | ICD-10-CM

## 2017-05-22 NOTE — Telephone Encounter (Signed)
Patients daughter Andria Rhein called stating patient is needing a referral to an orthopedic physician for possibly something is going on with the patients hip per the OT.  Renate [phone]  Thank You

## 2017-05-23 NOTE — Telephone Encounter (Signed)
Renate called back Brit and she is at work so if you can she is asking that you call her brother Rocky Link who is at the house with her mom right now. He can be reached @ [phone]  Thank You

## 2017-05-23 NOTE — Telephone Encounter (Signed)
Renate states the patient needs a referral to orthopedics for her right hip issues.  Thank you

## 2017-05-23 NOTE — Telephone Encounter (Signed)
That is what the family is requesting, a ortho referral for right hip issues.

## 2017-05-23 NOTE — Telephone Encounter (Signed)
Called and left patient's daughter a VM letting her know that a referral has been entered for her mom.

## 2017-05-23 NOTE — Telephone Encounter (Signed)
Routing to provider  

## 2017-05-23 NOTE — Telephone Encounter (Signed)
Called and spoke to [facility] as requested by the patient's daughter. Rocky Link states that they were under the impression that an orthopedic referral was already in place. I explained that I did not see a referral for ortho, but there was one for cardiology and one for neurology. Rocky Link asked if anything was done on 05/15/17 and I looked and did not see anything on the 13th of this month. I saw the 11th but Rocky Link said that was not it. He stated that he was looking through all of the papers from the patient's doctors and saw an echocardiogram on the 13th, so he said that's where the 13th came from. I explained that we had not entered a referral for ortho and that there was no documentation in Cheryl's recent note about any hip issues. Rocky Link said that he does not know what was going on and that his sister may be confused. He states that he is going to talk with her more about this and see if he can find out anything. I asked for them to please give me a call back if there is anything we can do for them.

## 2017-05-23 NOTE — Telephone Encounter (Signed)
Should I put in referral for Orthopedics?

## 2017-05-23 NOTE — Telephone Encounter (Signed)
If pain following a fall, she should get an x-ray

## 2017-05-23 NOTE — Telephone Encounter (Signed)
Called and left patient's daughter a VM asking for her to please return my call. Apologized for not getting back to her when she called 2 days ago.

## 2017-05-23 NOTE — Telephone Encounter (Signed)
See other phone message  

## 2017-05-23 NOTE — Addendum Note (Signed)
Addended by: Gabriel Cirri on: 05/23/2017 04:26 PM   Modules accepted: Orders

## 2017-06-02 ENCOUNTER — Emergency Department: Payer: Medicare Other

## 2017-06-02 ENCOUNTER — Encounter: Payer: Self-pay | Admitting: Emergency Medicine

## 2017-06-02 ENCOUNTER — Inpatient Hospital Stay
Admission: EM | Admit: 2017-06-02 | Discharge: 2017-06-06 | DRG: 470 | Disposition: A | Discharge status: Skilled Nursing Facility | Payer: Medicare Other | Attending: Internal Medicine | Admitting: Internal Medicine

## 2017-06-02 DIAGNOSIS — W19XXXA Unspecified fall, initial encounter: Secondary | ICD-10-CM | POA: Diagnosis present

## 2017-06-02 DIAGNOSIS — Z9081 Acquired absence of spleen: Secondary | ICD-10-CM | POA: Diagnosis not present

## 2017-06-02 DIAGNOSIS — S72011A Unspecified intracapsular fracture of right femur, initial encounter for closed fracture: Secondary | ICD-10-CM | POA: Diagnosis present

## 2017-06-02 DIAGNOSIS — Z79899 Other long term (current) drug therapy: Secondary | ICD-10-CM | POA: Diagnosis not present

## 2017-06-02 DIAGNOSIS — E876 Hypokalemia: Secondary | ICD-10-CM | POA: Diagnosis not present

## 2017-06-02 DIAGNOSIS — Z681 Body mass index (BMI) 19 or less, adult: Secondary | ICD-10-CM | POA: Diagnosis not present

## 2017-06-02 DIAGNOSIS — Z23 Encounter for immunization: Secondary | ICD-10-CM

## 2017-06-02 DIAGNOSIS — I482 Chronic atrial fibrillation: Secondary | ICD-10-CM | POA: Diagnosis present

## 2017-06-02 DIAGNOSIS — F0390 Unspecified dementia without behavioral disturbance: Secondary | ICD-10-CM | POA: Diagnosis present

## 2017-06-02 DIAGNOSIS — Z9181 History of falling: Secondary | ICD-10-CM | POA: Diagnosis not present

## 2017-06-02 DIAGNOSIS — Z66 Do not resuscitate: Secondary | ICD-10-CM | POA: Diagnosis present

## 2017-06-02 DIAGNOSIS — Z8249 Family history of ischemic heart disease and other diseases of the circulatory system: Secondary | ICD-10-CM | POA: Diagnosis not present

## 2017-06-02 DIAGNOSIS — R636 Underweight: Secondary | ICD-10-CM | POA: Diagnosis present

## 2017-06-02 DIAGNOSIS — Z880 Allergy status to penicillin: Secondary | ICD-10-CM

## 2017-06-02 DIAGNOSIS — Z87891 Personal history of nicotine dependence: Secondary | ICD-10-CM

## 2017-06-02 DIAGNOSIS — Z90710 Acquired absence of both cervix and uterus: Secondary | ICD-10-CM | POA: Diagnosis not present

## 2017-06-02 DIAGNOSIS — Z7901 Long term (current) use of anticoagulants: Secondary | ICD-10-CM

## 2017-06-02 DIAGNOSIS — Z888 Allergy status to other drugs, medicaments and biological substances status: Secondary | ICD-10-CM

## 2017-06-02 DIAGNOSIS — N183 Chronic kidney disease, stage 3 (moderate): Secondary | ICD-10-CM | POA: Diagnosis present

## 2017-06-02 DIAGNOSIS — S72009A Fracture of unspecified part of neck of unspecified femur, initial encounter for closed fracture: Secondary | ICD-10-CM | POA: Diagnosis present

## 2017-06-02 DIAGNOSIS — R55 Syncope and collapse: Secondary | ICD-10-CM | POA: Diagnosis present

## 2017-06-02 DIAGNOSIS — I129 Hypertensive chronic kidney disease with stage 1 through stage 4 chronic kidney disease, or unspecified chronic kidney disease: Secondary | ICD-10-CM | POA: Diagnosis present

## 2017-06-02 DIAGNOSIS — Z419 Encounter for procedure for purposes other than remedying health state, unspecified: Secondary | ICD-10-CM

## 2017-06-02 DIAGNOSIS — Z96649 Presence of unspecified artificial hip joint: Secondary | ICD-10-CM

## 2017-06-02 LAB — TYPE AND SCREEN
ABO/RH(D): B POS
Antibody Screen: NEGATIVE

## 2017-06-02 LAB — COMPREHENSIVE METABOLIC PANEL
ALT: 21 U/L (ref 14–54)
AST: 26 U/L (ref 15–41)
Albumin: 4.2 g/dL (ref 3.5–5.0)
Alkaline Phosphatase: 123 U/L (ref 38–126)
Anion gap: 12 (ref 5–15)
BUN: 14 mg/dL (ref 6–20)
CO2: 29 mmol/L (ref 22–32)
Calcium: 9.4 mg/dL (ref 8.9–10.3)
Chloride: 99 mmol/L — ABNORMAL LOW (ref 101–111)
Creatinine, Ser: 0.81 mg/dL (ref 0.44–1.00)
GFR calc Af Amer: >60 mL/min (ref >60)
GFR calc non Af Amer: >60 mL/min (ref >60)
Glucose, Bld: 94 mg/dL (ref 65–99)
Potassium: 3.5 mmol/L (ref 3.5–5.1)
Sodium: 140 mmol/L (ref 135–145)
Total Bilirubin: 0.4 mg/dL (ref 0.3–1.2)
Total Protein: 7.4 g/dL (ref 6.5–8.1)

## 2017-06-02 LAB — CBC WITH DIFFERENTIAL/PLATELET
Basophils Absolute: 0.1 10*3/uL (ref 0–0.1)
Basophils Relative: 1 %
EOS ABS: 0.1 10*3/uL (ref 0–0.7)
Eosinophils Relative: 1 %
HEMATOCRIT: 39.3 % (ref 35.0–47.0)
HEMOGLOBIN: 13.1 g/dL (ref 12.0–16.0)
LYMPHS ABS: 1.5 10*3/uL (ref 1.0–3.6)
LYMPHS PCT: 16 %
MCH: 30.1 pg (ref 26.0–34.0)
MCHC: 33.2 g/dL (ref 32.0–36.0)
MCV: 90.8 fL (ref 80.0–100.0)
MONOS PCT: 6 %
Monocytes Absolute: 0.6 10*3/uL (ref 0.2–0.9)
NEUTROS ABS: 7.3 10*3/uL — AB (ref 1.4–6.5)
NEUTROS PCT: 76 %
Platelets: 397 10*3/uL (ref 150–440)
RBC: 4.33 MIL/uL (ref 3.80–5.20)
RDW: 14.6 % — ABNORMAL HIGH (ref 11.5–14.5)
WBC: 9.6 10*3/uL (ref 3.6–11.0)

## 2017-06-02 LAB — MRSA PCR SCREENING: MRSA BY PCR: NEGATIVE

## 2017-06-02 LAB — TROPONIN I: Troponin I: <0.03 ng/mL (ref <0.03)

## 2017-06-02 LAB — APTT: aPTT: 27 seconds (ref 24–36)

## 2017-06-02 LAB — PROTIME-INR
INR: 1.12
Prothrombin Time: 14.3 seconds (ref 11.4–15.2)

## 2017-06-02 MED ORDER — POLYETHYLENE GLYCOL 3350 17 G PO PACK: 17.0000 g | PACK | Freq: Every day | ORAL | Status: DC | PRN

## 2017-06-02 MED ORDER — HYDROCHLOROTHIAZIDE 12.5 MG PO CAPS
12.5000 mg | ORAL_CAPSULE | Freq: Every day | ORAL | Status: DC
  Administered 2017-06-03 – 2017-06-06 (×3): 12.5 mg via ORAL
  Filled 2017-06-02 (×3): qty 1

## 2017-06-02 MED ORDER — ONDANSETRON HCL 4 MG/2ML IJ SOLN: 4.0000 mg | Freq: Four times a day (QID) | INTRAMUSCULAR | Status: DC | PRN

## 2017-06-02 MED ORDER — ACETAMINOPHEN 650 MG RE SUPP: 650.0000 mg | Freq: Four times a day (QID) | RECTAL | Status: DC | PRN

## 2017-06-02 MED ORDER — HYDROCODONE-ACETAMINOPHEN 5-325 MG PO TABS: 1.0000 | ORAL_TABLET | ORAL | Status: DC | PRN

## 2017-06-02 MED ORDER — SODIUM CHLORIDE 0.9 % IV SOLN
INTRAVENOUS | Status: DC
  Administered 2017-06-02: 21:00:00 via INTRAVENOUS

## 2017-06-02 MED ORDER — ADULT MULTIVITAMIN W/MINERALS CH
1.0000 | ORAL_TABLET | Freq: Every day | ORAL | Status: DC
  Administered 2017-06-03 – 2017-06-06 (×3): 1 via ORAL
  Filled 2017-06-02 (×3): qty 1

## 2017-06-02 MED ORDER — ONDANSETRON HCL 4 MG PO TABS: 4.0000 mg | ORAL_TABLET | Freq: Four times a day (QID) | ORAL | Status: DC | PRN

## 2017-06-02 MED ORDER — LOSARTAN POTASSIUM 50 MG PO TABS
50.0000 mg | ORAL_TABLET | Freq: Every day | ORAL | Status: DC
  Administered 2017-06-03 – 2017-06-06 (×3): 50 mg via ORAL
  Filled 2017-06-02 (×3): qty 1

## 2017-06-02 MED ORDER — BISACODYL 5 MG PO TBEC: 5.0000 mg | DELAYED_RELEASE_TABLET | Freq: Every day | ORAL | Status: DC | PRN

## 2017-06-02 MED ORDER — INFLUENZA VAC SPLIT HIGH-DOSE 0.5 ML IM SUSY
0.5000 mL | PREFILLED_SYRINGE | INTRAMUSCULAR | Status: DC
  Filled 2017-06-02: qty 0.5

## 2017-06-02 MED ORDER — ACETAMINOPHEN 325 MG PO TABS
650.0000 mg | ORAL_TABLET | Freq: Four times a day (QID) | ORAL | Status: DC | PRN
  Administered 2017-06-03: 650 mg via ORAL
  Filled 2017-06-02: qty 2

## 2017-06-02 MED ORDER — SODIUM CHLORIDE 0.9 % IV BOLUS (SEPSIS)
1000.0000 mL | Freq: Once | INTRAVENOUS | Status: AC
  Administered 2017-06-02: 1000 mL via INTRAVENOUS

## 2017-06-02 MED ORDER — MEMANTINE HCL 5 MG PO TABS
5.0000 mg | ORAL_TABLET | Freq: Every day | ORAL | Status: DC
  Administered 2017-06-03 – 2017-06-06 (×3): 5 mg via ORAL
  Filled 2017-06-02 (×3): qty 1

## 2017-06-02 MED ORDER — DILTIAZEM HCL ER COATED BEADS 240 MG PO CP24
240.0000 mg | ORAL_CAPSULE | Freq: Every day | ORAL | Status: DC
  Administered 2017-06-03 – 2017-06-06 (×3): 240 mg via ORAL
  Filled 2017-06-02 (×2): qty 1
  Filled 2017-06-02: qty 2

## 2017-06-02 NOTE — ED Notes (Addendum)
Called lab to add on APTT.

## 2017-06-02 NOTE — ED Notes (Signed)
Pt placed on bedpan. Tolerated well. Lifted hips. Cleaned after urinating.

## 2017-06-02 NOTE — ED Notes (Signed)
Pt placed on to urinate. New brief placed on pt.

## 2017-06-02 NOTE — H&P (Signed)
[facility] [place] [facility]   PATIENT NAME: Paula Thomas    MR#:  [id number]  DATE OF BIRTH:  1931/11/09  DATE OF ADMISSION:  06/02/2017  PRIMARY CARE PHYSICIAN: Gabriel Cirri, NP   REQUESTING/REFERRING PHYSICIAN: After Sharma Covert  CHIEF COMPLAINT:   Right hip pain HISTORY OF PRESENT ILLNESS:  Paula Thomas  is a 83 y.o. female with a known history of Chronic atrial fibrillation on anticoagulation, chronic syncope episodes and dementia who presents with above complaint. Patient suffered a fall about a month and a half ago and had bruising to her left hip. X-rays were performed at that time for her left hip and it was negative for fractures according to the family members at bedside. She presents today due to ongoing right hip pain. Patient has been ambulating however she has been suffering pain in the right hip. Today's x-ray shows There is an acute mildly angulated impacted subcapital fracture of the right hip..  Orthopedic surgery has been consulted by ED physician.   PAST MEDICAL HISTORY:   Past Medical History:  Diagnosis Date  . Atrial fibrillation (HCC)   . Fatigue   . Hypertension   . Syncope   . Underweight     PAST SURGICAL HISTORY:   Past Surgical History:  Procedure Laterality Date  . ABDOMINAL HYSTERECTOMY    . EYE SURGERY Bilateral 2012   cataracts  . HERNIA REPAIR    . SPLENECTOMY    . TONSILLECTOMY      SOCIAL HISTORY:   Social History  Substance Use Topics  . Smoking status: Former Games developer  . Smokeless tobacco: Never Used  . Alcohol use No    FAMILY HISTORY:   Family History  Problem Relation Age of Onset  . Cancer Mother   . Cancer Father   . Heart attack Brother   . Heart attack Brother     DRUG ALLERGIES:   Allergies  Allergen Reactions  . Lisinopril Cough  . Penicillin G Potassium [Penicillin G]     Has patient had a PCN reaction causing immediate rash, facial/tongue/throat swelling, SOB or  lightheadedness with hypotension: Unknown Has patient had a PCN reaction causing severe rash involving mucus membranes or skin necrosis: Unknown Has patient had a PCN reaction that required hospitalization: Unknown Has patient had a PCN reaction occurring within the last 10 years: Unknown If all of the above answers are "NO", then may proceed with Cephalosporin use.   . Sulfa Antibiotics     REVIEW OF SYSTEMS:   ROS  MEDICATIONS AT HOME:   Prior to Admission medications   Medication Sig Start Date End Date Taking? Authorizing Provider  apixaban (ELIQUIS) 2.5 MG TABS tablet Take 2.5 mg by mouth 2 (two) times daily. 03/07/16  Yes [provider]  Cholecalciferol (VITAMIN D3 PO) Take 50 mcg by mouth daily.   Yes [provider]  diltiazem (CARDIZEM CD) 240 MG 24 hr capsule Take 240 mg by mouth daily. 03/07/16  Yes [provider]  hydrochlorothiazide (MICROZIDE) 12.5 MG capsule Take 12.5 mg by mouth daily.   Yes [provider]  losartan (COZAAR) 50 MG tablet Take 1 tablet (50 mg total) by mouth daily. 05/08/17  Yes Gabriel Cirri, NP  memantine (NAMENDA) 5 MG tablet Take 5 mg by mouth daily. 01/06/17 01/06/18 Yes [provider]  Multiple Vitamins-Minerals (HM MULTIVITAMIN ADULT GUMMY PO) Take 1 tablet by mouth daily.    Yes [provider]  acetaminophen (TYLENOL 8  HOUR ARTHRITIS PAIN) 650 MG CR tablet Take 650 mg by mouth every 8 (eight) hours as needed for pain.    [provider]      VITAL SIGNS:  Blood pressure (!) 153/76, pulse 72, temperature 97.6 F (36.4 C), temperature source Oral, resp. rate (!) 22, SpO2 96 %.  PHYSICAL EXAMINATION:   Physical Exam    LABORATORY PANEL:   CBC  Recent Labs Lab 06/02/17 1313  WBC 9.6  HGB 13.1  HCT 39.3  PLT 397   ------------------------------------------------------------------------------------------------------------------  Chemistries   Recent Labs Lab  06/02/17 1313  NA 140  K 3.5  CL 99*  CO2 29  GLUCOSE 94  BUN 14  CREATININE 0.81  CALCIUM 9.4  AST 26  ALT 21  ALKPHOS 123  BILITOT 0.4   ------------------------------------------------------------------------------------------------------------------  Cardiac Enzymes  Recent Labs Lab 06/02/17 1313  TROPONINI <0.03   ------------------------------------------------------------------------------------------------------------------  RADIOLOGY:  Dg Chest 1 View  Result Date: 06/02/2017 CLINICAL DATA:  Confusion, right hip pain. EXAM: CHEST 1 VIEW COMPARISON:  Chest x-ray of April 27, 2017 FINDINGS: The lungs are well-expanded and clear. The cardiac silhouette is enlarged. The pulmonary vascularity is normal. There is calcification in the wall of the thoracic aorta. The bony thorax exhibits no acute abnormality. IMPRESSION: Stable cardiomegaly.  No acute cardiopulmonary abnormality. Thoracic aortic atherosclerosis. Electronically Signed   By: David  [place] M.D.   On: 06/02/2017 14:57   Ct Head Wo Contrast  Result Date: 06/02/2017 CLINICAL DATA:  Patient with worsening falls. History of dementia. Altered mental status. EXAM: CT HEAD WITHOUT CONTRAST TECHNIQUE: Contiguous axial images were obtained from the base of the skull through the vertex without intravenous contrast. COMPARISON:  Brain CT 04/27/2017. FINDINGS: Brain: Ventricles and sulci are prominent compatible with atrophy. Periventricular and subcortical white matter hypodensity compatible with chronic microvascular ischemic changes. No evidence for acute cortically based infarct, intracranial hemorrhage, mass lesion or mass-effect. Vascular: Internal carotid arterial vascular calcifications. Skull: Intact. Sinuses/Orbits: Paranasal sinuses are well aerated. Mastoid air cells are unremarkable. Orbits are unremarkable. Other: None. IMPRESSION: No acute intracranial process. Atrophy and chronic microvascular ischemic changes.  Electronically Signed   By: Annia Belt M.D.   On: 06/02/2017 15:00   Dg Hip Unilat W Or Wo Pelvis 2-3 Views Right  Result Date: 06/02/2017 CLINICAL DATA:  Right hip pain.  History of multiple falls. EXAM: DG HIP (WITH OR WITHOUT PELVIS) 2-3V RIGHT COMPARISON:  Right hip series of April 27, 2017 FINDINGS: The bones are subjectively osteopenic. There is an acute subcapital fracture of the right hip. The femoral neck, intertrochanteric, and subtrochanteric regions appear normal. The observed portions of the right hemipelvis are normal. IMPRESSION: There is an acute mildly angulated impacted subcapital fracture of the right hip. Incidental note is made of scleroses within the femoral head on the left which may reflect avascular necrosis. No acute fracture is observed. Electronically Signed   By: David  [place] M.D.   On: 06/02/2017 14:55    EKG:  Atrial fibrillation heart rate 73  IMPRESSION AND PLAN:    83 year old female with dementia and chronic atrial fibrillation on anticoagulation who suffered a fall about a month and a half ago who initially came in with left-sided pain and bruising which negative x-rays however has had persistent right-sided hip pain and now has a fracture seen on x-ray.  1. right hip fracture: Stop anticoagulation  for planned orthopedic surgery. Orthopedic surgery consultation requested  2. Chronic atrial fibrillation: Stop anticoagulation for now  for planned surgery Continue Cardiazem for heart rate control  3. Essential hypertension: Continue diltiazem, HCTZ and losartan  4. Dementia: Continue Namenda   All the records are reviewed and case discussed with ED provider. Management plans discussed with the patient's son and he is in agreement  CODE STATUS: DO NOT RESUSCITATE  TOTAL TIME TAKING CARE OF THIS PATIENT: 41 minutes.    Divante Kotch M.D on 06/02/2017 at 3:53 PM  Between 7am to 6pm - Pager - [phone]  After 6pm go to [email or web address] - Geophysicist/field seismologist  [facility]  Office  [phone]  CC: Primary care physician; Gabriel Cirri, NP

## 2017-06-02 NOTE — ED Notes (Signed)
Admitting at bedside 

## 2017-06-02 NOTE — ED Notes (Signed)
Pt taken to scans via stretcher.  

## 2017-06-02 NOTE — ED Notes (Signed)
Pt placed on bedpan, tolerated well.  

## 2017-06-02 NOTE — ED Notes (Signed)
First nurse note: Dr. Carollee Massed called from [facility]. Pt sent for positive right hip fracture. Pt has had increasing falls. Pt on Eliquis. Pt has history of dementia. Pt here with caregiver. Pt son Jorja Loa can be reached at [phone]. Pt last ate at 0800 per Dr. Constance Holster.

## 2017-06-02 NOTE — ED Triage Notes (Signed)
Pt sent over for positive right hip fracture, fell one mth ago and just now had xray because it was not getting any better with pain.

## 2017-06-02 NOTE — ED Provider Notes (Signed)
[facility] Emergency Department Provider Note  ____________________________________________  Time seen: Approximately 2:15 PM  I have reviewed the triage vital signs and the nursing notes.   HISTORY  Chief Complaint Hip Pain  The patient's history is limited due to her advanced dementia; she is accompanied by her son and her primary caregiver.  HPI Paula Thomas is a 83 y.o. female with a history of advanced dementia, A. Fib on, Elqiuis, recurrent syncope, HTN, sent from the orthopedic clinic for right hip fracture. Per report the patient has recurrent falls, and went for evaluation for left hip pain today. On x-ray, she sound to have a right hip fracture.At baseline, the patient uses a walker and has been ambulating since her last known fall which was one month ago. The patient underwent CT of the right hip 8/26 with no acute findings.she was then admitted 8/27 for syncopal episode, which included negative CT of the head, normal ejection fraction in her echocardiogram,and reassuring carotid duplex studies. She had no arrhythmias on telemetry and was discharged home without any obvious cause for the patient's syncope. The caregiver reports that she frequently "passes out," most recently during a day last week when she had 6 minutes of unresponsiveness and was slumped over, followed by stating that she felt her head felt "funny." There is no known history of chest pain, shortness of breath, lightheadedness, numbness tingling or weakness, hypoglycemia, diaphoresis, or recent illness.  At baseline, the patient walks with a walker.  Unfortunate, I do not have access to the patient's imaging studies from today.   Past Medical History:  Diagnosis Date  . Atrial fibrillation (HCC)   . Fatigue   . Hypertension   . Syncope   . Underweight     Patient Active Problem List   Diagnosis Date Noted  . Syncope and collapse 04/27/2017  . Advance care planning 02/07/2017   . Anemia 02/07/2017  . Hypocalcemia 11/15/2016  . Cognitive impairment 06/11/2016  . Wart 05/28/2016  . Sciatica 04/14/2015  . Weakness generalized 04/14/2015  . Risk for falls 04/14/2015  . Atrial fibrillation (HCC) 03/07/2015  . Syncope 03/07/2015  . Underweight 03/07/2015  . Hypertension 03/07/2015  . Osteoarthritis of left knee 03/07/2015  . Benign hypertension with CKD (chronic kidney disease) stage I 03/07/2015  . CKD (chronic kidney disease), stage III (HCC) 03/07/2015    Past Surgical History:  Procedure Laterality Date  . ABDOMINAL HYSTERECTOMY    . EYE SURGERY Bilateral 2012   cataracts  . HERNIA REPAIR    . SPLENECTOMY    . TONSILLECTOMY      Current Outpatient Rx  . Order #: [id number] Class: Historical Med  . Order #: [id number] Class: Historical Med  . Order #: [id number] Class: Historical Med  . Order #: [id number] Class: Historical Med  . Order #: [id number] Class: Historical Med  . Order #: [id number] Class: Normal  . Order #: [id number] Class: Historical Med  . Order #: [id number] Class: Historical Med    Allergies Lisinopril; Penicillin g potassium [penicillin g]; and Sulfa antibiotics  Family History  Problem Relation Age of Onset  . Cancer Mother   . Cancer Father   . Heart attack Brother   . Heart attack Brother     Social History Social History  Substance Use Topics  . Smoking status: Former Games developer  . Smokeless tobacco: Never Used  . Alcohol use No    Review of Systems Limited due to patient dementia.   ____________________________________________   PHYSICAL EXAM:  VITAL SIGNS: ED  Triage Vitals  Enc Vitals Group     BP 06/02/17 1301 (!) 163/79     Pulse Rate 06/02/17 1301 86     Resp 06/02/17 1301 16     Temp 06/02/17 1301 97.6 F (36.4 C)     Temp Source 06/02/17 1301 Oral     SpO2 06/02/17 1301 96 %     Weight --      Height --      Head Circumference --      Peak Flow --      Pain Score 06/02/17 1303 5     Pain Loc --       Pain Edu? --      Excl. in GC? --     Constitutional: the patient is alert but not oriented; she answers some questions appropriately. She is in no acute distress and sitting comfortably on the stretcher, able to move about without any significant pain or grimacing. Eyes: Conjunctivae are normal.  EOMI. No scleral icterus.accoon eyes. Head: Atraumatic.no Battle sign. Nose: No congestion/rhinnorhea. No swelling over the nose or septal hematoma. Mouth/Throat: Mucous membranes are moist. No dental injury or malocclusion Neck: No stridor.  Supple.  Midline C-spine tenderness to palpation, step-offs or deformities. Cardiovascular: Irregular rate, regular rhythm. No murmurs, rubs or gallops.  Respiratory: Normal respiratory effort.  No accessory muscle use or retractions. Lungs CTAB.  No wheezes, rales or ronchi. Gastrointestinal: Soft, nontender and nondistended.  No guarding or rebound.  No peritoneal signs. Musculoskeletal: No LE edema. No ttp in the calves or palpable cords.  Negative Homan's sign.pelvis is stable. The patient has full range of motion of the bilateral hips, knees, and ankles without pain. Normal DP and PT pulses bilaterally. Neurologic:  A&Ox3.  Speech is clear.  Face and smile are symmetric.  EOMI.  Moves all extremities well. Skin:  Skin is warm, dry and intact. No rash noted. Psychiatric: Mood and affect are normal. ____________________________________________   LABS (all labs ordered are listed, but only abnormal results are displayed)  Labs Reviewed  CBC WITH DIFFERENTIAL/PLATELET - Abnormal; Notable for the following:       Result Value   RDW 14.6 (*)    Neutro Abs 7.3 (*)    All other components within normal limits  COMPREHENSIVE METABOLIC PANEL - Abnormal; Notable for the following:    Chloride 99 (*)    All other components within normal limits  PROTIME-INR  TROPONIN I  APTT  TYPE AND SCREEN   ____________________________________________  EKG  ED ECG  REPORT I, Rockne Menghini, the attending physician, personally viewed and interpreted this ECG.   Date: 06/02/2017  EKG Time: 1404  Rate: 73  Rhythm: normal sinus rhythm  Axis: normal  Intervals:none  ST&T Change: Nonspecific T-wave inversions in V1. No STEMI.  ____________________________________________  RADIOLOGY  No results found.  ____________________________________________   PROCEDURES  Procedure(s) performed: None  Procedures  Critical Care performed: No ____________________________________________   INITIAL IMPRESSION / ASSESSMENT AND PLAN / ED COURSE  Pertinent labs & imaging results that were available during my care of the patient were reviewed by me and considered in my medical decision making (see chart for details).  83 y.o. F w/ advanced dementia sent from Ortho clinic for a right hip fx, also noted to have recurrent syncope which has been extensively worked up.  Here, the patient does have rate controlled atrial fibrillation and mild hypertension but is asymptomatic. She does not have pain in her extremities, including her  right hip. I'll plan to CT her head given that she is a poor historian and on Eliquis, and clearly has had a fall that has occurred after the last month resulting in the right hip fracture since she had a negative CT.the patient will be admitted for further evaluation and treatment. She has been nothing by mouth but is unlikely tender go surgery today given that she is on Eliquis and may need additional surgical clearance given her recurrent syncope.  ----------------------------------------- 3:35 PM on 06/02/2017 -----------------------------------------  The patient's x-ray does show a subcapital fracture in the right hip, and she has been admitted to the hospitalist. I have left a message with Dr. Allena Katz, who is in surgery, to evaluate the patient for her hip fracture. ____________________________________________  FINAL CLINICAL  IMPRESSION(S) / ED DIAGNOSES  Final diagnoses:  Syncope, unspecified syncope type         NEW MEDICATIONS STARTED DURING THIS VISIT:  New Prescriptions   No medications on file      Rockne Menghini, MD 06/02/17 1535

## 2017-06-02 NOTE — ED Notes (Signed)
Per son pt has frequent falls. States last time she was here she had bruising on L hip so xray of L hip but negative. States has been walking on R hip x 1.5 month. Uses wheelchair, walker at home. States sometimes falls and sometimes passes out and falls. Pt is sitting up on stretcher, warm blankets covering her. States last meal was breakfast.

## 2017-06-03 LAB — CBC
HEMATOCRIT: 39.2 % (ref 35.0–47.0)
HEMOGLOBIN: 13.2 g/dL (ref 12.0–16.0)
MCH: 30.2 pg (ref 26.0–34.0)
MCHC: 33.7 g/dL (ref 32.0–36.0)
MCV: 89.6 fL (ref 80.0–100.0)
Platelets: 400 10*3/uL (ref 150–440)
RBC: 4.37 MIL/uL (ref 3.80–5.20)
RDW: 14.7 % — ABNORMAL HIGH (ref 11.5–14.5)
WBC: 12.6 10*3/uL — ABNORMAL HIGH (ref 3.6–11.0)

## 2017-06-03 LAB — BASIC METABOLIC PANEL
ANION GAP: 8 (ref 5–15)
BUN: 13 mg/dL (ref 6–20)
CALCIUM: 9.1 mg/dL (ref 8.9–10.3)
CHLORIDE: 105 mmol/L (ref 101–111)
CO2: 29 mmol/L (ref 22–32)
Creatinine, Ser: 0.63 mg/dL (ref 0.44–1.00)
GFR calc non Af Amer: >60 mL/min (ref >60)
GLUCOSE: 95 mg/dL (ref 65–99)
Potassium: 3.3 mmol/L — ABNORMAL LOW (ref 3.5–5.1)
Sodium: 142 mmol/L (ref 135–145)

## 2017-06-03 MED ORDER — POTASSIUM CHLORIDE CRYS ER 20 MEQ PO TBCR
40.0000 meq | EXTENDED_RELEASE_TABLET | Freq: Once | ORAL | Status: AC
  Administered 2017-06-03: 40 meq via ORAL
  Filled 2017-06-03: qty 2

## 2017-06-03 NOTE — Consult Note (Signed)
ORTHOPAEDIC CONSULTATION  REQUESTING PHYSICIAN: Delfino Lovett, MD  Chief Complaint:   R hip pain  History of Present Illness: Paula Thomas is a 83 y.o. female with history of dementia and a-fib on Eliquis. History obtained from prior treating physicians (Dr. Landry Mellow and ED) as well as discussion with her PoA Paula Thomas - son @ [phone]). She lives at home by herself with constant 24 hour supervision. She had a fall ~1.5 months ago and x-rays at that time were reported as negative. She again began developing increasing hip pain over the last few days, but it is unclear as to when exactly pain began. She denies any recent falls. She walks with a walker at baseline. She was still able to ambulate, although with significant pain.  Past Medical History:  Diagnosis Date  . Atrial fibrillation (HCC)   . Fatigue   . Hypertension   . Syncope   . Underweight    Past Surgical History:  Procedure Laterality Date  . ABDOMINAL HYSTERECTOMY    . EYE SURGERY Bilateral 2012   cataracts  . HERNIA REPAIR    . SPLENECTOMY    . TONSILLECTOMY     Social History   Social History  . Marital status: Widowed    Spouse name: N/A  . Number of children: N/A  . Years of education: N/A   Social History Main Topics  . Smoking status: Former Games developer  . Smokeless tobacco: Never Used  . Alcohol use No  . Drug use: No  . Sexual activity: No   Other Topics Concern  . None   Social History Narrative  . None   Family History  Problem Relation Age of Onset  . Cancer Mother   . Cancer Father   . Heart attack Brother   . Heart attack Brother    Allergies  Allergen Reactions  . Lisinopril Cough  . Penicillin G Potassium [Penicillin G]     Has patient had a PCN reaction causing immediate rash, facial/tongue/throat swelling, SOB or lightheadedness with hypotension: Unknown Has patient had a PCN reaction causing severe rash  involving mucus membranes or skin necrosis: Unknown Has patient had a PCN reaction that required hospitalization: Unknown Has patient had a PCN reaction occurring within the last 10 years: Unknown If all of the above answers are "NO", then may proceed with Cephalosporin use.   . Sulfa Antibiotics    Prior to Admission medications   Medication Sig Start Date End Date Taking? Authorizing Provider  apixaban (ELIQUIS) 2.5 MG TABS tablet Take 2.5 mg by mouth 2 (two) times daily. 03/07/16  Yes [provider]  Cholecalciferol (VITAMIN D3 PO) Take 50 mcg by mouth daily.   Yes [provider]  diltiazem (CARDIZEM CD) 240 MG 24 hr capsule Take 240 mg by mouth daily. 03/07/16  Yes [provider]  hydrochlorothiazide (MICROZIDE) 12.5 MG capsule Take 12.5 mg by mouth daily.   Yes [provider]  losartan (COZAAR) 50 MG tablet Take 1 tablet (50 mg total) by mouth daily. 05/08/17  Yes Gabriel Cirri, NP  memantine (NAMENDA) 5 MG tablet Take 5 mg by mouth daily. 01/06/17 01/06/18 Yes [provider]  Multiple Vitamins-Minerals (HM MULTIVITAMIN ADULT GUMMY PO) Take 1 tablet by mouth daily.    Yes [provider]  acetaminophen (TYLENOL 8 HOUR ARTHRITIS PAIN) 650 MG CR tablet Take 650 mg by mouth every 8 (eight) hours as needed for pain.    [provider]   Dg Chest 1  View  Result Date: 06/02/2017 CLINICAL DATA:  Confusion, right hip pain. EXAM: CHEST 1 VIEW COMPARISON:  Chest x-ray of April 27, 2017 FINDINGS: The lungs are well-expanded and clear. The cardiac silhouette is enlarged. The pulmonary vascularity is normal. There is calcification in the wall of the thoracic aorta. The bony thorax exhibits no acute abnormality. IMPRESSION: Stable cardiomegaly.  No acute cardiopulmonary abnormality. Thoracic aortic atherosclerosis. Electronically Signed   By: David  [place] M.D.   On: 06/02/2017 14:57   Ct Head Wo Contrast  Result Date: 06/02/2017 CLINICAL  DATA:  Patient with worsening falls. History of dementia. Altered mental status. EXAM: CT HEAD WITHOUT CONTRAST TECHNIQUE: Contiguous axial images were obtained from the base of the skull through the vertex without intravenous contrast. COMPARISON:  Brain CT 04/27/2017. FINDINGS: Brain: Ventricles and sulci are prominent compatible with atrophy. Periventricular and subcortical white matter hypodensity compatible with chronic microvascular ischemic changes. No evidence for acute cortically based infarct, intracranial hemorrhage, mass lesion or mass-effect. Vascular: Internal carotid arterial vascular calcifications. Skull: Intact. Sinuses/Orbits: Paranasal sinuses are well aerated. Mastoid air cells are unremarkable. Orbits are unremarkable. Other: None. IMPRESSION: No acute intracranial process. Atrophy and chronic microvascular ischemic changes. Electronically Signed   By: Annia Belt M.D.   On: 06/02/2017 15:00   Dg Hip Unilat W Or Wo Pelvis 2-3 Views Right  Result Date: 06/02/2017 CLINICAL DATA:  Right hip pain.  History of multiple falls. EXAM: DG HIP (WITH OR WITHOUT PELVIS) 2-3V RIGHT COMPARISON:  Right hip series of April 27, 2017 FINDINGS: The bones are subjectively osteopenic. There is an acute subcapital fracture of the right hip. The femoral neck, intertrochanteric, and subtrochanteric regions appear normal. The observed portions of the right hemipelvis are normal. IMPRESSION: There is an acute mildly angulated impacted subcapital fracture of the right hip. Incidental note is made of scleroses within the femoral head on the left which may reflect avascular necrosis. No acute fracture is observed. Electronically Signed   By: David  [place] M.D.   On: 06/02/2017 14:55    Positive ROS: All other systems have been reviewed and were otherwise negative with the exception of those mentioned in the HPI and as above.  Physical Exam: General:  A&O x 2 Psychiatric:  Patient is NOT competent for consent   Cardiovascular:  No pedal edema, regular peripheral pulses Respiratory:  No wheezing, non-labored breathing, chest sounds clear GI:  Abdomen is soft and non-tender Skin:  No lesions in the area of chief complaint Neurologic:  Sensation intact distally Lymphatic:  No axillary or cervical lymphadenopathy  Orthopedic Exam:  RLE: - able to PF/DF ankle and fire EHL - Sensation grossly intact over foot - +distal pulse - +logroll - no notable skin lesions over posterolateral hip  X-rays:  As above - displaced R femoral neck fracture  Assessment: 83 yo F w/dementia and on Eliquis with displaced R femoral neck fracture  Plan: - Discussed management options with son Paula Thomas @ [phone]). He is also her PoA. After discussion of risks, benefits, and alternatives to surgery, we elected to proceed with R hip hemiarthroplasty.  - Plan for evening of Wed, 06/04/17 for surgery given she is taking Eliquis and we would like to wait at least 48 hours until her last dose.  - NPO after midnight Tuesday    Signa Kell   06/03/2017 12:10 AM

## 2017-06-03 NOTE — Clinical Social Work Note (Addendum)
Clinical Social Work Assessment  Patient Details  Name: Paula Thomas MRN: [id number] Date of Birth: 05/12/1932  Date of referral:  06/03/17               Reason for consult:  Facility Placement                Permission sought to share information with:  Chartered certified accountant granted to share information::  Yes, Verbal Permission Granted  Name::      [name]::   [name]   Relationship::     Contact Information:     Housing/Transportation Living arrangements for the past 2 months:  [place] of Information:  Patient Patient Interpreter Needed:  None Criminal Activity/Legal Involvement Pertinent to Current Situation/Hospitalization:  No - Comment as needed Significant Relationships:  Adult Children Lives with:  Self Do you feel safe going back to the place where you live?  Yes Need for family participation in patient care:     Care giving concerns:  Patient lives alone in [place].    Facilities manager / plan:  Holiday representative ([place]) received SNF consult. Patient will have surgery Wednesday for a hip fracture per chart. CSW met with patient today to discuss D/C plan. Patient was sitting up in the bed and was alert and oriented to self, place and time but not situation. CSW introduced self and explained role of CSW department. Patient reported that she lives alone in [place] and is going home today. CSW explained that patient will remain in the hospital until she has surgery. Patient reported that she spoke with the doctor yesterday about surgery but they never set a time. CSW explained that it would be hard for patient to go home now because she is very limited with mobility due to the hip fracture. CSW also explained that PT will evaluate patient after surgery and make a recommendation of home health or SNF. Patient verbalized her understanding and is agreeable to SNF search in [place]. FL2  complete and faxed out. CSW attempted to contact patient's son Paula Thomas however he did not answer and a voicemail was left. CSW will continue to follow and assist as needed.    Patient's son Paula Thomas called CSW back and stated that he is in patient's room now. CSW met with patient's son at bedside this afternoon. Per Paula Thomas he lives in [place] and his sister is driving from [place] to [place] tomorrow morning. CSW explained to [name] that PT will evaluate patient after surgery and make a recommendation of SNF or home health. Per Paula Thomas he will discuss D/C options with his sister. [name] reported that patient has 24/7 private duty care givers through [employer] in her home in [place].    Employment status:  Retired Forensic scientist:  Managed Medicare PT Recommendations:  Not assessed at this time Information / Referral to community resources:  [place]  Patient/Family's Response to care:  Patient is agreeable to [facility] in [place].   Patient/Family's Understanding of and Emotional Response to Diagnosis, Current Treatment, and Prognosis:  Patient was pleasant but very anxious and wanted to go home before surgery. CSW provided emotional support.   Emotional Assessment Appearance:  Appears stated age Attitude/Demeanor/Rapport:    Affect (typically observed):  Pleasant, Anxious Orientation:  Oriented to Self, Oriented to Place, Oriented to  Time, Fluctuating Orientation (Suspected and/or reported Sundowners) Alcohol / Substance use:  Not Applicable Psych involvement (Current  and /or in the community):  No (Comment)  Discharge Needs  Concerns to be addressed:  Discharge Planning Concerns Readmission within the last 30 days:  No Current discharge risk:  Dependent with Mobility Barriers to Discharge:  Continued Medical Work up   [employer], Veronia Beets, LCSW 06/03/2017, 9:56 AM

## 2017-06-03 NOTE — [place] FL2 (Signed)
[place] MEDICAID FL2 LEVEL OF CARE SCREENING TOOL     IDENTIFICATION  Patient Name: Paula Thomas Birthdate: [birth year] Sex: female Admission Date (Current Location): 06/02/2017  [place] and [place] Number:  Chiropodist and Address:  [facility], [address], [place], [place] [address]      Provider Number: [id number]  Attending Physician Name and Address:  Delfino Lovett, MD  Relative Name and Phone Number:       Current Level of Care: Hospital Recommended Level of Care: Skilled Nursing Facility Prior Approval Number:    Date Approved/Denied:   PASRR Number:   [id number]  Discharge Plan: SNF    Current Diagnoses: Patient Active Problem List   Diagnosis Date Noted  . Hip fracture (HCC) 06/02/2017  . Syncope and collapse 04/27/2017  . Advance care planning 02/07/2017  . Anemia 02/07/2017  . Hypocalcemia 11/15/2016  . Cognitive impairment 06/11/2016  . Wart 05/28/2016  . Sciatica 04/14/2015  . Weakness generalized 04/14/2015  . Risk for falls 04/14/2015  . Atrial fibrillation (HCC) 03/07/2015  . Syncope 03/07/2015  . Underweight 03/07/2015  . Hypertension 03/07/2015  . Osteoarthritis of left knee 03/07/2015  . Benign hypertension with CKD (chronic kidney disease) stage I 03/07/2015  . CKD (chronic kidney disease), stage III (HCC) 03/07/2015    Orientation RESPIRATION BLADDER Height & Weight     Time, Situation  Normal Continent Weight: 110 lb 0.2 oz (49.9 kg) Height:   (172.7 cm)  BEHAVIORAL SYMPTOMS/MOOD NEUROLOGICAL BOWEL NUTRITION STATUS      Continent Diet (NPO to be advanced)  AMBULATORY STATUS COMMUNICATION OF NEEDS Skin   Extensive Assist Verbally Surgical Wound (Right Hip)                       Personal Care Assistance Level of Assistance  Bathing, Feeding, Dressing Bathing Assistance: Limited assistance Feeding assistance: Independent Dressing Assistance: Limited assistance     Functional  Limitations Info  Sight, Hearing, Speech Sight Info: Adequate Hearing Info: Adequate Speech Info: Adequate    SPECIAL CARE FACTORS FREQUENCY  PT (By licensed PT), OT (By licensed OT)     PT Frequency:  (5) OT Frequency:  (5)            Contractures      Additional Factors Info  Code Status, Allergies Code Status Info:  (DNR) Allergies Info:  (LISINOPRIL, PENICILLIN G POTASSIUM PENICILLIN G, SULFA ANTIBIOTICS )           Current Medications (06/03/2017):  This is the current hospital active medication list Current Facility-Administered Medications  Medication Dose Route Frequency Provider Last Rate Last Dose  . 0.9 %  sodium chloride infusion   Intravenous Continuous Adrian Saran, MD 75 mL/hr at 06/02/17 2043    . acetaminophen (TYLENOL) tablet 650 mg  650 mg Oral Q6H PRN Adrian Saran, MD       Or  . acetaminophen (TYLENOL) suppository 650 mg  650 mg Rectal Q6H PRN Mody, Sital, MD      . bisacodyl (DULCOLAX) EC tablet 5 mg  5 mg Oral Daily PRN Adrian Saran, MD      . diltiazem (CARDIZEM CD) 24 hr capsule 240 mg  240 mg Oral Daily Mody, Sital, MD      . hydrochlorothiazide (MICROZIDE) capsule 12.5 mg  12.5 mg Oral Daily Mody, Sital, MD      . HYDROcodone-acetaminophen (NORCO/VICODIN) 5-325 MG per tablet 1-2 tablet  1-2 tablet Oral Q4H  PRAdrian SaranSital, MD      . Influenza vac split quadrivalent PF (FLUZONE HIGH-DOSE) injection 0.5 mL  0.5 mL Intramuscular Tomorrow-1000 Mody, Sital, MD      . losartan (COZAAR) tablet 50 mg  50 mg Oral Daily Mody, Sital, MD      . memantine (NAMENDA) tablet 5 mg  5 mg Oral Daily Mody, Sital, MD      . multivitamin with minerals tablet 1 tablet  1 tablet Oral Daily Mody, Sital, MD      . ondansetron (ZOFRAN) tablet 4 mg  4 mg Oral Q6H PRN Mody, Sital, MD       Or  . ondansetron (ZOFRAN) injection 4 mg  4 mg Intravenous Q6H PRN Mody, Sital, MD      . polyethylene glycol (MIRALAX / GLYCOLAX) packet 17 g  17 g Oral Daily PRN Adrian Saran, MD          Discharge Medications: Please see discharge summary for a list of discharge medications.  Relevant Imaging Results:  Relevant Lab Results:   Additional Information  (SSN: [id number])  Payton Spark, Student-Social Work

## 2017-06-03 NOTE — Clinical Social Work Placement (Signed)
   CLINICAL SOCIAL WORK PLACEMENT  NOTE  Date:  06/03/2017  Patient Details  Name: Paula Thomas MRN: [id number] Date of Birth: [birth year]  Clinical Social Work is seeking post-discharge placement for this patient at the Skilled  Nursing Facility level of care (*CSW will initial, date and re-position this form in  chart as items are completed):  Yes   Patient/family provided with [facility] Clinical Social Work Department's list of facilities offering this level of care within the geographic area requested by the patient (or if unable, by the patient's family).  Yes   Patient/family informed of their freedom to choose among providers that offer the needed level of care, that participate in Medicare, Medicaid or managed care program needed by the patient, have an available bed and are willing to accept the patient.  Yes   Patient/family informed of [facility]'s ownership interest in [facility] and [facility], as well as of the fact that they are under no obligation to receive care at these facilities.  PASRR submitted to EDS on 06/03/17     PASRR number received on 06/03/17     Existing PASRR number confirmed on       FL2 transmitted to all facilities in geographic area requested by pt/family on 06/03/17     FL2 transmitted to all facilities within larger geographic area on       Patient informed that his/her managed care company has contracts with or will negotiate with certain facilities, including the following:            Patient/family informed of bed offers received.  Patient chooses bed at       Physician recommends and patient chooses bed at      Patient to be transferred to   on  .  Patient to be transferred to facility by       Patient family notified on   of transfer.  Name of family member notified:        PHYSICIAN       Additional Comment:    _______________________________________________ Giulio Bertino, Darleen Crocker, LCSW 06/03/2017, 9:54 AM

## 2017-06-03 NOTE — Progress Notes (Signed)
Pt alert to self and situation. Iv infusing without difficulty. No complaints of pain the shift. Voiding without difficulty. Possible surgery tommorow. Report given to Kathline Magic she will resume care.

## 2017-06-03 NOTE — Progress Notes (Signed)
[facility] [place] at [facility]   PATIENT NAME: Paula Thomas    MR#:  [id number]  DATE OF BIRTH:  Dec 13, 1931  SUBJECTIVE:  CHIEF COMPLAINT:   Chief Complaint  Patient presents with  . Hip Pain  hip pain REVIEW OF SYSTEMS:  Review of Systems  Constitutional: Negative for chills, fever and weight loss.  HENT: Negative for nosebleeds and sore throat.   Eyes: Negative for blurred vision.  Respiratory: Negative for cough, shortness of breath and wheezing.   Cardiovascular: Negative for chest pain, orthopnea, leg swelling and PND.  Gastrointestinal: Negative for abdominal pain, constipation, diarrhea, heartburn, nausea and vomiting.  Genitourinary: Negative for dysuria and urgency.  Musculoskeletal: Positive for joint pain. Negative for back pain.  Skin: Negative for rash.  Neurological: Negative for dizziness, speech change, focal weakness and headaches.  Endo/Heme/Allergies: Does not bruise/bleed easily.  Psychiatric/Behavioral: Negative for depression.    DRUG ALLERGIES:   Allergies  Allergen Reactions  . Lisinopril Cough  . Penicillin G Potassium [Penicillin G]     Has patient had a PCN reaction causing immediate rash, facial/tongue/throat swelling, SOB or lightheadedness with hypotension: Unknown Has patient had a PCN reaction causing severe rash involving mucus membranes or skin necrosis: Unknown Has patient had a PCN reaction that required hospitalization: Unknown Has patient had a PCN reaction occurring within the last 10 years: Unknown If all of the above answers are "NO", then may proceed with Cephalosporin use.   . Sulfa Antibiotics    VITALS:  Blood pressure (!) 156/74, pulse 69, temperature 98.9 F (37.2 C), temperature source Oral, resp. rate 18, height  (1.727 m), weight 49.9 kg (110 lb 0.2 oz), SpO2 97 %. PHYSICAL EXAMINATION:  Physical Exam  Constitutional: She is oriented to person, place, and time and well-developed,  well-nourished, and in no distress.  HENT:  Head: Normocephalic and atraumatic.  Eyes: Pupils are equal, round, and reactive to light. Conjunctivae and EOM are normal.  Neck: Normal range of motion. Neck supple. No tracheal deviation present. No thyromegaly present.  Cardiovascular: Normal rate, regular rhythm and normal heart sounds.   Pulmonary/Chest: Effort normal and breath sounds normal. No respiratory distress. She has no wheezes. She exhibits no tenderness.  Abdominal: Soft. Bowel sounds are normal. She exhibits no distension. There is no tenderness.  Musculoskeletal:       Right hip: She exhibits decreased range of motion, decreased strength, tenderness and deformity.  Neurological: She is alert and oriented to person, place, and time. No cranial nerve deficit.  Skin: Skin is warm and dry. No rash noted.  Psychiatric: Mood and affect normal.   LABORATORY PANEL:  Female CBC  Recent Labs Lab 06/03/17 0436  WBC 12.6*  HGB 13.2  HCT 39.2  PLT 400   ------------------------------------------------------------------------------------------------------------------ Chemistries   Recent Labs Lab 06/02/17 1313 06/03/17 0436  NA 140 142  K 3.5 3.3*  CL 99* 105  CO2 29 29  GLUCOSE 94 95  BUN 14 13  CREATININE 0.81 0.63  CALCIUM 9.4 9.1  AST 26  --   ALT 21  --   ALKPHOS 123  --   BILITOT 0.4  --    RADIOLOGY:  No results found. ASSESSMENT AND PLAN:  83 year old female with dementia and chronic atrial fibrillation on anticoagulation who suffered a fall about a month and a half ago who initially came in with left-sided pain and bruising which negative x-rays however has had persistent right-sided hip pain and now  has a fracture seen on x-ray.  1. right hip pain: due to fracture - anticoagulation on hold for planned orthopedic surgery. Orthopedic planning for surgery tomorrow due to eliquis  2. Chronic atrial fibrillation: Stopped anticoagulation for now for planned  surgery tomorrow Continue Cardiazem for heart rate control  3. Essential hypertension: Continue diltiazem, HCTZ and losartan  4. Hypokalemia: - Replete and recheck  5. Dementia: Continue Namenda     All the records are reviewed and case discussed with Care Management/Social Worker. Management plans discussed with the patient, nursing and they are in agreement.  CODE STATUS: DNR  TOTAL TIME TAKING CARE OF THIS PATIENT:  35 minutes.   More than 50% of the time was spent in counseling/coordination of care: YES  POSSIBLE D/C IN 2-3 DAYS, DEPENDING ON CLINICAL CONDITION.   Delfino Lovett M.D on 06/03/2017 at 3:03 PM  Between 7am to 6pm - Pager - [phone]  After 6pm go to [email or web address] - Social research officer, government  [facility] [place] [facility]  Office  [phone]  CC: Primary care physician; Gabriel Cirri, NP  Note: This dictation was prepared with Dragon dictation along with smaller phrase technology. Any transcriptional errors that result from this process are unintentional.

## 2017-06-03 NOTE — Progress Notes (Signed)
Initial Nutrition Assessment  DOCUMENTATION CODES:   Not applicable  INTERVENTION:  1. Recommend continue MVI w/ Minerals when diet is advanced 2. Monitor for needs following surgery  NUTRITION DIAGNOSIS:   Inadequate oral intake related to inability to eat as evidenced by NPO status.  GOAL:   Patient will meet greater than or equal to 90% of their needs  MONITOR:   Diet advancement, I & O's, Labs, Weight trends  REASON FOR ASSESSMENT:   Low Braden    ASSESSMENT:   83 yo female with PMH A-Fib, chronic syncope, dementia, fell a month and a half ago xray was negative at the time but patient had continued pain, now found to have R hip fracture awaiting R hip hemiarthoplasty scheduled for 06/04/2017   Spoke with patient at bedside. Noted history of dementia, unsure of accuracy. She states she had good PO intake PTA with weight gain. Per chart, weight appears to have fluctuated between 104-115 pounds over the past 7 months. States she ate cereal with milk and coffee this morning. Normal PO intake consists of: Breakfast - cereal with milk, coffee Lunch - Banana sandwich Dinner - Cooked meal - she did not go into further detail  Patient has ensure at home but doesn't normally drink it. Encouraged her to consume following surgery to help with wound healing.  Nutrition-Focused physical exam completed. Findings are no fat depletion, mild muscle depletion at temples, and no edema.   Labs reviewed:  K 3.3 Medications reviewed and include:  NS at 13mL/hr  Diet Order:  Diet NPO time specified  Skin:  Reviewed, no issues  Last BM:  PTA  Height:   Ht Readings from Last 1 Encounters:  06/02/17  (1.727 m)  Patient's previous heights have varied from 5'3" - 5'5" with a BMI varying between 20.38 - 18.31 Unsure of accuracy of weight or height Appears WNL from an NFPE standpoint.  Weight:   Wt Readings from Last 1 Encounters:  06/02/17 110 lb 0.2 oz (49.9 kg)    Ideal  Body Weight:  63.63 kg  BMI:  Body mass index is 16.73 kg/m.  Estimated Nutritional Needs:   Kcal:  1250-1500 calories  Protein:  70-80 grams  Fluid:  >1.5L  EDUCATION NEEDS:   Education needs addressed  Dionne Ano. Alizaya Oshea, MS, RD LDN Inpatient Clinical Dietitian Pager [phone]

## 2017-06-03 NOTE — Progress Notes (Signed)
Pt had removed external foley cath and IV. Pt resting semi fowlers in bed. Pt easily reoriented but remains confused intermittently. Pt remains incontinent. Will continue to monitor.

## 2017-06-04 ENCOUNTER — Encounter: Payer: Self-pay | Admitting: Anesthesiology

## 2017-06-04 ENCOUNTER — Inpatient Hospital Stay: Payer: Medicare Other | Admitting: Anesthesiology

## 2017-06-04 ENCOUNTER — Encounter
Admission: EM | Disposition: A | Discharge status: Skilled Nursing Facility | Payer: Self-pay | Source: Home / Self Care | Attending: Internal Medicine

## 2017-06-04 ENCOUNTER — Inpatient Hospital Stay: Payer: Medicare Other

## 2017-06-04 HISTORY — PX: HIP ARTHROPLASTY: SHX981

## 2017-06-04 LAB — CBC
HCT: 38 % (ref 35.0–47.0)
Hemoglobin: 12.9 g/dL (ref 12.0–16.0)
MCH: 31 pg (ref 26.0–34.0)
MCHC: 34.1 g/dL (ref 32.0–36.0)
MCV: 91 fL (ref 80.0–100.0)
PLATELETS: 390 10*3/uL (ref 150–440)
RBC: 4.17 MIL/uL (ref 3.80–5.20)
RDW: 14.5 % (ref 11.5–14.5)
WBC: 9.7 10*3/uL (ref 3.6–11.0)

## 2017-06-04 LAB — BASIC METABOLIC PANEL
ANION GAP: 7 (ref 5–15)
BUN: 20 mg/dL (ref 6–20)
CALCIUM: 9.2 mg/dL (ref 8.9–10.3)
CO2: 28 mmol/L (ref 22–32)
Chloride: 106 mmol/L (ref 101–111)
Creatinine, Ser: 0.84 mg/dL (ref 0.44–1.00)
GFR calc Af Amer: >60 mL/min (ref >60)
GLUCOSE: 98 mg/dL (ref 65–99)
Potassium: 3.7 mmol/L (ref 3.5–5.1)
SODIUM: 141 mmol/L (ref 135–145)

## 2017-06-04 SURGERY — HEMIARTHROPLASTY, HIP, DIRECT ANTERIOR APPROACH, FOR FRACTURE
Anesthesia: General | Site: Hip | Laterality: Right | Wound class: Clean

## 2017-06-04 MED ORDER — MENTHOL 3 MG MT LOZG
1.0000 | LOZENGE | OROMUCOSAL | Status: DC | PRN
  Filled 2017-06-04: qty 9

## 2017-06-04 MED ORDER — FENTANYL CITRATE (PF) 100 MCG/2ML IJ SOLN
25.0000 ug | INTRAMUSCULAR | Status: AC | PRN
  Administered 2017-06-04 (×2): 25 ug via INTRAVENOUS

## 2017-06-04 MED ORDER — SENNOSIDES-DOCUSATE SODIUM 8.6-50 MG PO TABS: 1.0000 | ORAL_TABLET | Freq: Every evening | ORAL | Status: DC | PRN

## 2017-06-04 MED ORDER — FENTANYL CITRATE (PF) 100 MCG/2ML IJ SOLN
INTRAMUSCULAR | Status: AC
  Administered 2017-06-04: 25 ug via INTRAVENOUS
  Filled 2017-06-04: qty 2

## 2017-06-04 MED ORDER — DIPHENHYDRAMINE HCL 12.5 MG/5ML PO ELIX: 12.5000 mg | ORAL_SOLUTION | ORAL | Status: DC | PRN

## 2017-06-04 MED ORDER — DOCUSATE SODIUM 100 MG PO CAPS
100.0000 mg | ORAL_CAPSULE | Freq: Two times a day (BID) | ORAL | Status: DC
  Administered 2017-06-05 – 2017-06-06 (×3): 100 mg via ORAL
  Filled 2017-06-04 (×3): qty 1

## 2017-06-04 MED ORDER — CLINDAMYCIN PHOSPHATE 600 MG/50ML IV SOLN
600.0000 mg | Freq: Four times a day (QID) | INTRAVENOUS | Status: AC
  Administered 2017-06-04 – 2017-06-05 (×2): 600 mg via INTRAVENOUS
  Filled 2017-06-04 (×2): qty 50

## 2017-06-04 MED ORDER — CLINDAMYCIN PHOSPHATE 600 MG/50ML IV SOLN
INTRAVENOUS | Status: AC
  Filled 2017-06-04: qty 50

## 2017-06-04 MED ORDER — FENTANYL CITRATE (PF) 100 MCG/2ML IJ SOLN
25.0000 ug | INTRAMUSCULAR | Status: AC | PRN
  Administered 2017-06-04 (×6): 25 ug via INTRAVENOUS

## 2017-06-04 MED ORDER — ONDANSETRON HCL 4 MG/2ML IJ SOLN: 4.0000 mg | Freq: Once | INTRAMUSCULAR | Status: DC | PRN

## 2017-06-04 MED ORDER — CLINDAMYCIN PHOSPHATE 600 MG/50ML IV SOLN
INTRAVENOUS | Status: DC | PRN
  Administered 2017-06-04: 600 mg via INTRAVENOUS

## 2017-06-04 MED ORDER — OXYCODONE HCL 5 MG PO TABS
5.0000 mg | ORAL_TABLET | ORAL | Status: DC | PRN
  Administered 2017-06-06: 5 mg via ORAL
  Filled 2017-06-04: qty 1

## 2017-06-04 MED ORDER — FENTANYL CITRATE (PF) 100 MCG/2ML IJ SOLN
INTRAMUSCULAR | Status: AC
  Filled 2017-06-04: qty 2

## 2017-06-04 MED ORDER — ENOXAPARIN SODIUM 40 MG/0.4ML ~~LOC~~ SOLN
40.0000 mg | SUBCUTANEOUS | Status: DC
  Administered 2017-06-05 – 2017-06-06 (×2): 40 mg via SUBCUTANEOUS
  Filled 2017-06-04 (×2): qty 0.4

## 2017-06-04 MED ORDER — HYDROMORPHONE HCL 1 MG/ML IJ SOLN
0.5000 mg | INTRAMUSCULAR | Status: DC | PRN
  Administered 2017-06-04 (×2): 0.5 mg via INTRAVENOUS

## 2017-06-04 MED ORDER — SUGAMMADEX SODIUM 200 MG/2ML IV SOLN
INTRAVENOUS | Status: DC | PRN
  Administered 2017-06-04: 100 mg via INTRAVENOUS

## 2017-06-04 MED ORDER — PHENYLEPHRINE HCL 10 MG/ML IJ SOLN
INTRAMUSCULAR | Status: DC | PRN
  Administered 2017-06-04 (×2): 100 ug via INTRAVENOUS

## 2017-06-04 MED ORDER — METOCLOPRAMIDE HCL 5 MG PO TABS
5.0000 mg | ORAL_TABLET | Freq: Three times a day (TID) | ORAL | Status: DC | PRN
  Filled 2017-06-04: qty 2

## 2017-06-04 MED ORDER — SUCCINYLCHOLINE CHLORIDE 20 MG/ML IJ SOLN
INTRAMUSCULAR | Status: DC | PRN
  Administered 2017-06-04: 60 mg via INTRAVENOUS

## 2017-06-04 MED ORDER — HYDROMORPHONE HCL 1 MG/ML IJ SOLN
INTRAMUSCULAR | Status: AC
  Administered 2017-06-04: 0.5 mg via INTRAVENOUS
  Filled 2017-06-04: qty 1

## 2017-06-04 MED ORDER — NEOMYCIN-POLYMYXIN B GU 40-200000 IR SOLN
Status: DC | PRN
  Administered 2017-06-04: 1000 mL via SURGICAL_CAVITY

## 2017-06-04 MED ORDER — BISACODYL 10 MG RE SUPP: 10.0000 mg | Freq: Every day | RECTAL | Status: DC | PRN

## 2017-06-04 MED ORDER — PHENOL 1.4 % MT LIQD
1.0000 | OROMUCOSAL | Status: DC | PRN
Start: 2017-06-04 — End: 2017-06-06
  Filled 2017-06-04: qty 177

## 2017-06-04 MED ORDER — LACTATED RINGERS IV SOLN
INTRAVENOUS | Status: DC | PRN
  Administered 2017-06-04: 17:00:00 via INTRAVENOUS

## 2017-06-04 MED ORDER — LABETALOL HCL 5 MG/ML IV SOLN
INTRAVENOUS | Status: AC
  Administered 2017-06-04: 5 mg via INTRAVENOUS
  Filled 2017-06-04: qty 4

## 2017-06-04 MED ORDER — DEXAMETHASONE SODIUM PHOSPHATE 10 MG/ML IJ SOLN
INTRAMUSCULAR | Status: AC
  Filled 2017-06-04: qty 1

## 2017-06-04 MED ORDER — SODIUM CHLORIDE 0.9 % IV SOLN
INTRAVENOUS | Status: DC
  Administered 2017-06-04: 23:00:00 via INTRAVENOUS

## 2017-06-04 MED ORDER — SODIUM CHLORIDE 0.9 % IV SOLN
INTRAVENOUS | Status: DC | PRN
  Administered 2017-06-04: 20 ug/min via INTRAVENOUS

## 2017-06-04 MED ORDER — PROPOFOL 10 MG/ML IV BOLUS
INTRAVENOUS | Status: DC | PRN
  Administered 2017-06-04: 70 mg via INTRAVENOUS

## 2017-06-04 MED ORDER — SUGAMMADEX SODIUM 200 MG/2ML IV SOLN
INTRAVENOUS | Status: AC
  Filled 2017-06-04: qty 2

## 2017-06-04 MED ORDER — ACETAMINOPHEN 500 MG PO TABS
1000.0000 mg | ORAL_TABLET | Freq: Three times a day (TID) | ORAL | Status: AC
  Administered 2017-06-05 (×2): 1000 mg via ORAL
  Filled 2017-06-04 (×2): qty 2

## 2017-06-04 MED ORDER — METOPROLOL TARTRATE 5 MG/5ML IV SOLN
INTRAVENOUS | Status: AC
  Filled 2017-06-04: qty 5

## 2017-06-04 MED ORDER — LABETALOL HCL 5 MG/ML IV SOLN
5.0000 mg | INTRAVENOUS | Status: AC | PRN
  Administered 2017-06-04 (×2): 5 mg via INTRAVENOUS

## 2017-06-04 MED ORDER — TRANEXAMIC ACID 1000 MG/10ML IV SOLN
INTRAVENOUS | Status: AC
  Filled 2017-06-04: qty 10

## 2017-06-04 MED ORDER — ROCURONIUM BROMIDE 100 MG/10ML IV SOLN
INTRAVENOUS | Status: DC | PRN
  Administered 2017-06-04: 5 mg via INTRAVENOUS
  Administered 2017-06-04: 25 mg via INTRAVENOUS
  Administered 2017-06-04: 20 mg via INTRAVENOUS

## 2017-06-04 MED ORDER — ONDANSETRON HCL 4 MG/2ML IJ SOLN: 4.0000 mg | Freq: Four times a day (QID) | INTRAMUSCULAR | Status: DC | PRN

## 2017-06-04 MED ORDER — ONDANSETRON HCL 4 MG PO TABS: 4.0000 mg | ORAL_TABLET | Freq: Four times a day (QID) | ORAL | Status: DC | PRN

## 2017-06-04 MED ORDER — ONDANSETRON HCL 4 MG/2ML IJ SOLN
INTRAMUSCULAR | Status: DC | PRN
  Administered 2017-06-04: 4 mg via INTRAVENOUS

## 2017-06-04 MED ORDER — DEXAMETHASONE SODIUM PHOSPHATE 10 MG/ML IJ SOLN
INTRAMUSCULAR | Status: DC | PRN
  Administered 2017-06-04: 5 mg via INTRAVENOUS

## 2017-06-04 MED ORDER — HYDROMORPHONE HCL 1 MG/ML IJ SOLN: 0.4000 mg | INTRAMUSCULAR | Status: DC | PRN

## 2017-06-04 MED ORDER — TRANEXAMIC ACID 1000 MG/10ML IV SOLN
INTRAVENOUS | Status: DC | PRN
  Administered 2017-06-04: 750 mg via INTRAVENOUS

## 2017-06-04 MED ORDER — METOPROLOL TARTRATE 5 MG/5ML IV SOLN
INTRAVENOUS | Status: DC | PRN
  Administered 2017-06-04 (×4): 1 mg via INTRAVENOUS
  Administered 2017-06-04: 2 mg via INTRAVENOUS

## 2017-06-04 MED ORDER — HYDRALAZINE HCL 20 MG/ML IJ SOLN: 10.0000 mg | Freq: Four times a day (QID) | INTRAMUSCULAR | Status: DC | PRN

## 2017-06-04 MED ORDER — ONDANSETRON HCL 4 MG/2ML IJ SOLN
INTRAMUSCULAR | Status: AC
  Filled 2017-06-04: qty 2

## 2017-06-04 MED ORDER — FENTANYL CITRATE (PF) 100 MCG/2ML IJ SOLN
INTRAMUSCULAR | Status: DC | PRN
  Administered 2017-06-04 (×4): 50 ug via INTRAVENOUS

## 2017-06-04 MED ORDER — PROPOFOL 10 MG/ML IV BOLUS
INTRAVENOUS | Status: AC
  Filled 2017-06-04: qty 20

## 2017-06-04 MED ORDER — TRANEXAMIC ACID 1000 MG/10ML IV SOLN
15.0000 mg/kg | Freq: Once | INTRAVENOUS | Status: AC
  Administered 2017-06-04: 749 mg via INTRAVENOUS
  Filled 2017-06-04: qty 7.49

## 2017-06-04 MED ORDER — METOCLOPRAMIDE HCL 5 MG/ML IJ SOLN: 5.0000 mg | Freq: Three times a day (TID) | INTRAMUSCULAR | Status: DC | PRN

## 2017-06-04 MED ORDER — DILTIAZEM HCL 100 MG IV SOLR
5.0000 mg | INTRAVENOUS | Status: DC | PRN
  Administered 2017-06-04 (×3): 5 mg via INTRAVENOUS
  Filled 2017-06-04 (×4): qty 5

## 2017-06-04 MED ORDER — DEXTROSE 5 % IV SOLN
5.0000 mg/h | INTRAVENOUS | Status: DC
  Administered 2017-06-04 (×2): 10 mg/h via INTRAVENOUS
  Administered 2017-06-04: 12 mg/h via INTRAVENOUS
  Administered 2017-06-05: 10 mg/h via INTRAVENOUS
  Filled 2017-06-04 (×2): qty 100

## 2017-06-04 SURGICAL SUPPLY — 51 items
BLADE SAGITTAL WIDE XTHICK NO (BLADE) ×3 IMPLANT
BLADE SURG SZ10 CARB STEEL (BLADE) ×3 IMPLANT
BNDG COHESIVE 4X5 TAN STRL (GAUZE/BANDAGES/DRESSINGS) ×3 IMPLANT
CANISTER SUCT 1200ML W/VALVE (MISCELLANEOUS) ×3 IMPLANT
CANISTER SUCT 3000ML PPV (MISCELLANEOUS) ×6 IMPLANT
CAPT HIP HEMI 2 ×3 IMPLANT
CHLORAPREP W/TINT 26ML (MISCELLANEOUS) ×3 IMPLANT
DRAPE IMP U-DRAPE 54X76 (DRAPES) ×3 IMPLANT
DRAPE INCISE IOBAN 66X60 STRL (DRAPES) ×3 IMPLANT
DRAPE SHEET LG 3/4 BI-LAMINATE (DRAPES) ×6 IMPLANT
DRAPE SURG 17X11 SM STRL (DRAPES) ×3 IMPLANT
DRAPE TABLE BACK 80X90 (DRAPES) ×3 IMPLANT
DRSG OPSITE POSTOP 4X10 (GAUZE/BANDAGES/DRESSINGS) ×3 IMPLANT
ELECT BLADE 6.5 EXT (BLADE) ×3 IMPLANT
ELECT CAUTERY BLADE 6.4 (BLADE) ×3 IMPLANT
ELECT REM PT RETURN 9FT ADLT (ELECTROSURGICAL) ×3
ELECTRODE REM PT RTRN 9FT ADLT (ELECTROSURGICAL) ×1 IMPLANT
GAUZE PETRO XEROFOAM 1X8 (MISCELLANEOUS) ×3 IMPLANT
GAUZE SPONGE 4X4 12PLY STRL (GAUZE/BANDAGES/DRESSINGS) ×3 IMPLANT
GLOVE BIOGEL PI IND STRL 8 (GLOVE) ×1 IMPLANT
GLOVE BIOGEL PI INDICATOR 8 (GLOVE) ×2
GLOVE SURG SYN 7.5  E (GLOVE) ×4
GLOVE SURG SYN 7.5 E (GLOVE) ×2 IMPLANT
GOWN STRL REUS W/ TWL LRG LVL3 (GOWN DISPOSABLE) ×1 IMPLANT
GOWN STRL REUS W/ TWL XL LVL3 (GOWN DISPOSABLE) ×1 IMPLANT
GOWN STRL REUS W/TWL LRG LVL3 (GOWN DISPOSABLE) ×2
GOWN STRL REUS W/TWL XL LVL3 (GOWN DISPOSABLE) ×2
HEMOVAC 400ML (MISCELLANEOUS)
HIP CAPITATED HEMI 2 ×1 IMPLANT
KIT DRAIN HEMOVAC JP 7FR 400ML (MISCELLANEOUS) IMPLANT
KIT RM TURNOVER STRD PROC AR (KITS) ×3 IMPLANT
NDL SAFETY 18GX1.5 (NEEDLE) ×3 IMPLANT
NEEDLE FILTER BLUNT 18X 1/2SAF (NEEDLE) ×2
NEEDLE FILTER BLUNT 18X1 1/2 (NEEDLE) ×1 IMPLANT
NS IRRIG 1000ML POUR BTL (IV SOLUTION) ×3 IMPLANT
PACK HIP PROSTHESIS (MISCELLANEOUS) ×3 IMPLANT
PILLOW ABDUC SM (MISCELLANEOUS) ×3 IMPLANT
PULSAVAC PLUS IRRIG FAN TIP (DISPOSABLE) ×3
RETRIEVER SUT HEWSON (MISCELLANEOUS) IMPLANT
SOL .9 NS 3000ML IRR  AL (IV SOLUTION) ×2
SOL .9 NS 3000ML IRR UROMATIC (IV SOLUTION) ×1 IMPLANT
STAPLER SKIN PROX 35W (STAPLE) ×3 IMPLANT
SUT ETHIBOND #5 BRAIDED 30INL (SUTURE) ×3 IMPLANT
SUT MNCRL AB 4-0 PS2 18 (SUTURE) ×3 IMPLANT
SUT VIC AB 0 CT1 36 (SUTURE) ×3 IMPLANT
SUT VIC AB 2-0 CT2 27 (SUTURE) ×6 IMPLANT
SYR 20CC LL (SYRINGE) ×3 IMPLANT
TAPE MICROFOAM 4IN (TAPE) ×3 IMPLANT
TAPE TRANSPORE STRL 2 31045 (GAUZE/BANDAGES/DRESSINGS) ×3 IMPLANT
TIP BRUSH PULSAVAC PLUS 24.33 (MISCELLANEOUS) ×3 IMPLANT
TIP FAN IRRIG PULSAVAC PLUS (DISPOSABLE) ×1 IMPLANT

## 2017-06-04 NOTE — Transfer of Care (Signed)
Immediate Anesthesia Transfer of Care Note  Patient: Paula Thomas  Procedure(s) Performed: ARTHROPLASTY BIPOLAR HIP (HEMIARTHROPLASTY) (Right Hip)  Patient Location: PACU  Anesthesia Type:General  Level of Consciousness: sedated  Airway & Oxygen Therapy: Patient Spontanous Breathing and Patient connected to face mask oxygen  Post-op Assessment: Report given to RN and Post -op Vital signs reviewed and unstable, Anesthesiologist notified  Post vital signs: Reviewed and unstable  Last Vitals:  Vitals:   06/04/17 1537 06/04/17 1948  BP: (!) 145/75 (!) (P) 182/108  Pulse: (!) 52 (!) (P) 116  Resp: 16   Temp: 37.1 C (P) 36.9 C  SpO2: 95%     Last Pain:  Vitals:   06/04/17 1948  TempSrc:   PainSc: (P) Asleep     BP  Patients Stated Pain Goal: 2 (06/03/17 2122)  Complications: No apparent anesthesia complications

## 2017-06-04 NOTE — Anesthesia Post-op Follow-up Note (Signed)
Anesthesia QCDR form completed.        

## 2017-06-04 NOTE — Op Note (Signed)
DATE OF SURGERY: 06/04/2017  PREOPERATIVE DIAGNOSIS: Right femoral neck fracture  POSTOPERATIVE DIAGNOSIS: Right femoral neck fracture  PROCEDURE: Right hip hemiarthroplasty  SURGEON: Rosealee Albee, MD  ASSISTANTS:   EBL: 200 cc  COMPONENTS:  Stryker - Accolade TMZF Plus Size 3.5 Stem Stryker - Unitrax 47mm head with +4 offset neck   INDICATIONS: Paula Thomas is a 83 y.o. female with dementia who sustained a displaced right femoral neck fracture after a fall. The fall may have been remote as she has complaints of hip pain over the past month. Risks and benefits of right hip hemiarthroplasty were explained to the family. Risks include but are not limited to bleeding, infection, injury to tissues, nerves, vessels, periprosthetic infection, dislocation, limb length discrepancy and risks of anesthesia. The son is the PoA, understands these risks, completed an informed consent and wishes to proceed.   PROCEDURE:  The patient was identified in the preoperative holding area and the operative extremity was marked.  The patient was then transferred to the operating room suite and mobilized from the hospital gurney to the operating room table. Anesthesia was administered without complication.  A Foley catheter was placed. The patient was then transitioned to a lateral position.  All bony prominences were padded per protocol.  An axillary roll was placed.  Careful attention was paid to the contralateral side peroneal nerve, which was free from pressure with use of appropriate padding and blankets. A time-out was performed to confirm the patient's identity and the correct laterality of surgery. The patient was then prepped and draped in the usual sterile fashion. Appropriate pre-operative antibiotics were administered. Tranexamic acid was administered preoperatively.   An incision that centered on the posterior tip of the greater trochanter with a posterior curve was made. Dissection was carried  down through the subcutaneous tissue.  Careful attention was made to maintain hemostasis using electrocautery.  Dissection brought [place] to the level of the deep fascia where the gluteus maximus muscle and proximal portion of the IT band were identified.  The proximal region of the IT band was incised in linear fashion and this incision was extended proximally in a curvilinear fashion to split the gluteus maximus muscle parallel to its fibers to minimize bleeding.  This was accomplished using a combination bovie electrocautery as well as blunt dissection.  The trochanteric bursa was then visualized and dissected from anterior to posterior. A blunt homan retractor was placed beneath the abductors.  The piriformis tendon and short external rotators were visualized. Bovie electrocautery was used to cut these with the capsule as one L-shaped flap. This was tagged at the corner with #5 Ethibond. At this point, the femoral neck fracture was visualized. This was significantly impacted and the fracture had begun to heal.  An oscillating saw was used to make a neck cut approximately 15mm about the lesser tuberosity with the use of a neck cut guide. The head was then freed from its remaining soft tissue attachments and measured. The head trial was then inserted into the acetabulum and a 47 mm head felt to be appropriate for placement in the hip   We then turned our attention to preparing the femoral canal. First, a box cut was performed utilizing the box osteotome. A canal finder was inserted by hand and then sequential broaching was performed to size #3.5. The calcar planer was inserted onto the broach and used to smooth the calcar appropriately.  A trial stem, neutral neck, and head were inserted into the acetabulum  and placed through range of motion. Intraoperative radiographs showed the operative leg to be slightly shortened. A +82mm offset was used and this demonstrated improved stability and leg length. The hip was again  dislocated and the femoral trial components were removed.  The #3.5 stem was inserted into the femoral canal and then driven onto the calcar. The trial head was then again inserted on the femoral component and found to be appropriate. They were then removed and the permanent 47mm head with +46mm offset was Morse tapered onto the femoral stem and then reduced into the acetabulum.   The hip stability and length were reassessed and found to be satisfactory.  The wound was then copiously irrigated with normal saline solution. The tagged suture of the capsule and piriformis were sewn to the gluteus medius tendon. This adequately closed the hip capsule. The IT band and gluteus maximus fascia were then closed with 0-Vicryl in a running, locked fashion. The subdermal layer was closed with 2-0 Vicryl in a buried interrupted fashion.  Skin was closed with 4-0 Monocryl.  The wound was then dressed with Honeycomb dressing.  The patient was mobilized from the lateral position back to supine on the operating room table and then awakened from general anesthesia without complication.  POSTOPERATIVE PLAN: The patient will be WBAT on operative extremity. Lovenox /day x 4 weeks to start on POD#1. Ancef x 24 hours. PT/OT on POD#1.

## 2017-06-04 NOTE — Progress Notes (Addendum)
S/p R hip hemiarthroplasty. EBL 200cc.   POSTOP PLAN: - PT/OT on POD#1 - WBAT on operative extremity - Posterior hip precautions - Clindamycin  q8h x 24 hours - DVT ppx: Lovenox /day starting on POD#1  - Pain control: Tylenol scheduled + oxycodone PO prn + dilaudid iv for breakthrough - CBC/BMP in AM - Remove Foley on POD#1

## 2017-06-04 NOTE — H&P (Signed)
H&P reviewed. No significant changes noted.  

## 2017-06-04 NOTE — Progress Notes (Signed)
[facility] - [place] at [facility]   PATIENT NAME: Paula Thomas    MR#:  [id number]  DATE OF BIRTH:  03-23-1932  SUBJECTIVE:  CHIEF COMPLAINT:   Chief Complaint  Patient presents with  . Hip Pain  hip pain + REVIEW OF SYSTEMS:  Review of Systems  Constitutional: Negative for chills, fever and weight loss.  HENT: Negative for nosebleeds and sore throat.   Eyes: Negative for blurred vision.  Respiratory: Negative for cough, shortness of breath and wheezing.   Cardiovascular: Negative for chest pain, orthopnea, leg swelling and PND.  Gastrointestinal: Negative for abdominal pain, constipation, diarrhea, heartburn, nausea and vomiting.  Genitourinary: Negative for dysuria and urgency.  Musculoskeletal: Positive for joint pain. Negative for back pain.  Skin: Negative for rash.  Neurological: Negative for dizziness, speech change, focal weakness and headaches.  Endo/Heme/Allergies: Does not bruise/bleed easily.  Psychiatric/Behavioral: Negative for depression.    DRUG ALLERGIES:   Allergies  Allergen Reactions  . Lisinopril Cough  . Penicillin G Potassium [Penicillin G]     Has patient had a PCN reaction causing immediate rash, facial/tongue/throat swelling, SOB or lightheadedness with hypotension: Unknown Has patient had a PCN reaction causing severe rash involving mucus membranes or skin necrosis: Unknown Has patient had a PCN reaction that required hospitalization: Unknown Has patient had a PCN reaction occurring within the last 10 years: Unknown If all of the above answers are "NO", then may proceed with Cephalosporin use.   . Sulfa Antibiotics    VITALS:  Blood pressure (!) 145/75, pulse (!) 52, temperature 98.7 F (37.1 C), temperature source Oral, resp. rate 16, height  (1.727 m), weight 49.9 kg (110 lb 0.2 oz), SpO2 95 %. PHYSICAL EXAMINATION:  Physical Exam  Constitutional: She is oriented to person, place, and time and  well-developed, well-nourished, and in no distress.  HENT:  Head: Normocephalic and atraumatic.  Eyes: Pupils are equal, round, and reactive to light. Conjunctivae and EOM are normal.  Neck: Normal range of motion. Neck supple. No tracheal deviation present. No thyromegaly present.  Cardiovascular: Normal rate, regular rhythm and normal heart sounds.   Pulmonary/Chest: Effort normal and breath sounds normal. No respiratory distress. She has no wheezes. She exhibits no tenderness.  Abdominal: Soft. Bowel sounds are normal. She exhibits no distension. There is no tenderness.  Musculoskeletal:       Right hip: She exhibits decreased range of motion, decreased strength, tenderness and deformity.  Neurological: She is alert and oriented to person, place, and time. No cranial nerve deficit.  Skin: Skin is warm and dry. No rash noted.  Psychiatric: Mood and affect normal.   LABORATORY PANEL:  Female CBC  Recent Labs Lab 06/04/17 0449  WBC 9.7  HGB 12.9  HCT 38.0  PLT 390   ------------------------------------------------------------------------------------------------------------------ Chemistries   Recent Labs Lab 06/02/17 1313  06/04/17 0449  NA 140  < > 141  K 3.5  < > 3.7  CL 99*  < > 106  CO2 29  < > 28  GLUCOSE 94  < > 98  BUN 14  < > 20  CREATININE 0.81  < > 0.84  CALCIUM 9.4  < > 9.2  AST 26  --   --   ALT 21  --   --   ALKPHOS 123  --   --   BILITOT 0.4  --   --   < > = values in this interval not displayed. RADIOLOGY:  No results  found. ASSESSMENT AND PLAN:  83 year old female with dementia and chronic atrial fibrillation on anticoagulation who suffered a fall about a month and a half ago who initially came in with left-sided pain and bruising which negative x-rays however has had persistent right-sided hip pain and now has a fracture seen on x-ray.  1. Right hip pain: due to fracture - anticoagulation on hold for planned orthopedic surgery. Orthopedic planning  for surgery today  2. Chronic atrial fibrillation: Stopped anticoagulation for now for planned surgery today Continue Cardiazem for heart rate control  3. Essential hypertension: Continue diltiazem, HCTZ and losartan  4. Hypokalemia: - RepleteD and reSOLVED  5. Dementia: Continue Namenda     All the records are reviewed and case discussed with Care Management/Social Worker. Management plans discussed with the patient, nursing and they are in agreement.  CODE STATUS: DNR  TOTAL TIME TAKING CARE OF THIS PATIENT:  35 minutes.   More than 50% of the time was spent in counseling/coordination of care: YES  POSSIBLE D/C IN 2-3 DAYS, DEPENDING ON CLINICAL CONDITION. And ortho eval   Delfino Lovett M.D on 06/04/2017 at 5:34 PM  Between 7am to 6pm - Pager - [phone]  After 6pm go to [email or web address] - Social research officer, government  [facility]  Office  [phone]  CC: Primary care physician; Gabriel Cirri, NP  Note: This dictation was prepared with Dragon dictation along with smaller phrase technology. Any transcriptional errors that result from this process are unintentional.

## 2017-06-04 NOTE — Anesthesia Procedure Notes (Signed)
Procedure Name: Intubation Performed by: Mozes Sagar Pre-anesthesia Checklist: Patient identified, Patient being monitored, Timeout performed, Emergency Drugs available and Suction available Patient Re-evaluated:Patient Re-evaluated prior to induction Oxygen Delivery Method: Circle system utilized Preoxygenation: Pre-oxygenation with 100% oxygen Induction Type: IV induction and Rapid sequence Laryngoscope Size: Miller and 2 Grade View: Grade I Tube type: Oral Tube size: 7.0 mm Number of attempts: 1 Placement Confirmation: ETT inserted through vocal cords under direct vision,  positive ETCO2 and breath sounds checked- equal and bilateral Secured at: 21 cm Tube secured with: Tape Dental Injury: Teeth and Oropharynx as per pre-operative assessment        

## 2017-06-04 NOTE — Anesthesia Preprocedure Evaluation (Addendum)
Anesthesia Evaluation  Patient identified by MRN, date of birth, ID band Patient awake    Reviewed: Allergy & Precautions, H&P , NPO status , Patient's Chart, lab work & pertinent test results, reviewed documented beta blocker date and time   Airway Mallampati: II  TM Distance: >3 FB Neck ROM: full    Dental  (+) Teeth Intact   Pulmonary neg pulmonary ROS, former smoker,    Pulmonary exam normal        Cardiovascular hypertension, Pt. on medications negative cardio ROS Normal cardiovascular exam+ dysrhythmias Atrial Fibrillation  Rhythm:regular Rate:Normal     Neuro/Psych  Neuromuscular disease negative neurological ROS  negative psych ROS   GI/Hepatic negative GI ROS, Neg liver ROS,   Endo/Other  negative endocrine ROS  Renal/GU Renal InsufficiencyRenal diseasenegative Renal ROS  negative genitourinary   Musculoskeletal  (+) Arthritis , Osteoarthritis,    Abdominal   Peds negative pediatric ROS (+)  Hematology negative hematology ROS (+) anemia ,   Anesthesia Other Findings Past Medical History: No date: Atrial fibrillation (HCC) No date: Fatigue No date: Hypertension No date: Syncope No date: Underweight Past Surgical History: No date: ABDOMINAL HYSTERECTOMY 2012: EYE SURGERY; Bilateral     Comment:  cataracts No date: HERNIA REPAIR 06/04/2017: HIP ARTHROPLASTY; Right     Comment:  Procedure: ARTHROPLASTY BIPOLAR HIP (HEMIARTHROPLASTY);               Surgeon: Signa Kell, MD;  Location: [facility] ORS;  Service:              Orthopedics;  Laterality: Right; No date: SPLENECTOMY No date: TONSILLECTOMY BMI    Body Mass Index:  16.91 kg/m     Reproductive/Obstetrics negative OB ROS                            Anesthesia Physical Anesthesia Plan  ASA: III  Anesthesia Plan: General   Post-op Pain Management:    Induction: Intravenous  PONV Risk Score and Plan:   Airway  Management Planned: Oral ETT  Additional Equipment:   Intra-op Plan:   Post-operative Plan: Extubation in OR  Informed Consent: I have reviewed the patients History and Physical, chart, labs and discussed the procedure including the risks, benefits and alternatives for the proposed anesthesia with the patient or authorized representative who has indicated his/her understanding and acceptance.   Dental advisory given  Plan Discussed with: CRNA and Surgeon  Anesthesia Plan Comments:         Anesthesia Quick Evaluation

## 2017-06-05 ENCOUNTER — Encounter: Payer: Self-pay | Admitting: Orthopedic Surgery

## 2017-06-05 LAB — BASIC METABOLIC PANEL
ANION GAP: 8 (ref 5–15)
BUN: 16 mg/dL (ref 6–20)
CO2: 25 mmol/L (ref 22–32)
Calcium: 8.7 mg/dL — ABNORMAL LOW (ref 8.9–10.3)
Chloride: 105 mmol/L (ref 101–111)
Creatinine, Ser: 0.69 mg/dL (ref 0.44–1.00)
GFR calc Af Amer: >60 mL/min (ref >60)
GFR calc non Af Amer: >60 mL/min (ref >60)
Glucose, Bld: 153 mg/dL — ABNORMAL HIGH (ref 65–99)
POTASSIUM: 3.9 mmol/L (ref 3.5–5.1)
Sodium: 138 mmol/L (ref 135–145)

## 2017-06-05 LAB — CBC
HEMATOCRIT: 36.3 % (ref 35.0–47.0)
Hemoglobin: 12.2 g/dL (ref 12.0–16.0)
MCH: 30.6 pg (ref 26.0–34.0)
MCHC: 33.5 g/dL (ref 32.0–36.0)
MCV: 91.3 fL (ref 80.0–100.0)
Platelets: 359 10*3/uL (ref 150–440)
RBC: 3.98 MIL/uL (ref 3.80–5.20)
RDW: 14.4 % (ref 11.5–14.5)
WBC: 14.5 10*3/uL — AB (ref 3.6–11.0)

## 2017-06-05 LAB — GLUCOSE, CAPILLARY: Glucose-Capillary: 145 mg/dL — ABNORMAL HIGH (ref 65–99)

## 2017-06-05 NOTE — Progress Notes (Signed)
PT Cancellation Note  Patient Details Name: Paula Thomas MRN: [id number] DOB: 1931/10/01   Cancelled Treatment:    Reason Eval/Treat Not Completed: Medical issues which prohibited therapy. Pt just started on cardizem drip. Care discussed with RN who wished to hold until PM session to monitor vitals on drip. Will evaluate in PM.   Reyana Leisey 06/05/2017, 9:31 AM  Elizabeth Palau, PT, DPT [phone]

## 2017-06-05 NOTE — Progress Notes (Signed)
Spoke with dr. Sherryll Burger to make aware patients is on cardizem drip with no cardiology consult. Per md okay to discontinue cardizem drip patient on po cardizem

## 2017-06-05 NOTE — Care Management (Signed)
Patient admitted with hip fracture requiring surgical intervention. Patient transferred to 2A from pacu due to rapid atrial fib requiring Cardizem drip.  PT will attempt evaluation later today if heart rate is under control

## 2017-06-05 NOTE — Progress Notes (Addendum)
Pt has orders to transfer to 1A-151. Report called to Tobi Bastos, Charity fundraiser. Tele box removed and returned. Belongings taken with patient. Daughter at bedside and aware.

## 2017-06-05 NOTE — Progress Notes (Signed)
OT Cancellation Note  Patient Details Name: Paula Thomas MRN: [id number] DOB: [birth year]   Cancelled Treatment:    Reason Eval/Treat Not Completed: Medical issues which prohibited therapy. Order received, chart reviewed. Pt recently started on cardizem drip. Per RN/PT discussion and therapy protocol, will hold OT evaluation this morning and continue to monitor vitals on drip. Will re-attempt OT evaluation at later date/time as pt is medically appropriate.  Richrd Prime, MPH, MS, OTR/L ascom [phone] 06/05/17, 10:18 AM

## 2017-06-05 NOTE — Progress Notes (Signed)
   Subjective: 1 Day Post-Op Procedure(s) (LRB): ARTHROPLASTY BIPOLAR HIP (HEMIARTHROPLASTY) (Right) Patient reports pain as mild.   Patient is well, and has had no acute complaints or problems. Patient pleasantly confused. Unsure why she is hospital. Denies any CP, SOB, ABD pain. We will start therapy today.  Plan is to go Skilled nursing facility after hospital stay.  Objective: Vital signs in last 24 hours: Temp:  [97.4 F (36.3 C)-98.7 F (37.1 C)] 97.4 F (36.3 C) (10/04 0800) Pulse Rate:  [52-143] 65 (10/04 0800) Resp:  [11-21] 18 (10/04 0800) BP: (112-182)/(66-118) 130/73 (10/04 0800) SpO2:  [91 %-100 %] 99 % (10/04 0800) Weight:  [50.4 kg (111 lb 3.2 oz)] 50.4 kg (111 lb 3.2 oz) (10/03 2211)  Intake/Output from previous day: 10/03 0701 - 10/04 0700 In: 2724.9 [P.O.:240; I.V.:2384.9; IV Piggyback:100] Out: 1200 [Urine:1000; Blood:200] Intake/Output this shift: No intake/output data recorded.   Recent Labs  06/02/17 1313 06/03/17 0436 06/04/17 0449 06/05/17 0400  HGB 13.1 13.2 12.9 12.2    Recent Labs  06/04/17 0449 06/05/17 0400  WBC 9.7 14.5*  RBC 4.17 3.98  HCT 38.0 36.3  PLT 390 359    Recent Labs  06/04/17 0449 06/05/17 0400  NA 141 138  K 3.7 3.9  CL 106 105  CO2 28 25  BUN 20 16  CREATININE 0.84 0.69  GLUCOSE 98 153*  CALCIUM 9.2 8.7*    Recent Labs  06/02/17 1313  INR 1.12    EXAM General - Patient is Oriented and Confused. Appears in no distress or discomfort.  Extremity - Neurologically intact Neurovascular intact Sensation intact distally Intact pulses distally Dorsiflexion/Plantar flexion intact No cellulitis present Compartment soft Dressing - dressing C/D/I and no drainage Motor Function - intact, moving foot and toes well on exam.   Past Medical History:  Diagnosis Date  . Atrial fibrillation (HCC)   . Fatigue   . Hypertension   . Syncope   . Underweight     Assessment/Plan:   1 Day Post-Op Procedure(s)  (LRB): ARTHROPLASTY BIPOLAR HIP (HEMIARTHROPLASTY) (Right) Active Problems:   Hip fracture (HCC)  Estimated body mass index is 16.91 kg/m as calculated from the following:   Height as of this encounter:  (1.727 m).   Weight as of this encounter: 50.4 kg (111 lb 3.2 oz). Advance diet Up with therapy, WBAT RLE Needs BM Labs and vital signs are stable.  Recheck labs in the am CM to assist with discharge  DVT Prophylaxis - Lovenox, Foot Pumps and TED hose Weight-Bearing as tolerated to right leg   T. Cranston Neighbor, PA-C [facility] 06/05/2017, 9:07 AM

## 2017-06-05 NOTE — Clinical Social Work Note (Signed)
CSW presented bed offers to patient and her daughter Paula Thomas [phone].  Patient's daughter will review bed offers and notify social worker on Friday.  CSW to follow up with patient on Friday with decision on SNF.  CSW informed patient and her daughter that a decision will have to be made quickly because once patient is medically ready for discharge, the patient will have to go to a SNF.  CSW explained to patient and her daughter what to expect at SNF and how insurance will pay for stay.  Patient and daughter were appreciative of information given.  Ervin Knack. Lavaris Sexson, MSW, Theresia Majors [phone]  06/05/2017 5:38 PM

## 2017-06-05 NOTE — Progress Notes (Signed)
Physical Therapy Evaluation Patient Details Name: Paula Thomas MRN: [id number] DOB: 1932-05-29 Today's Date: 06/05/2017   History of Present Illness  Pt admitted for R hip hemiarthroplasty on 10/3, after sustaining hip fx one month ago from fall. Pt transferred to telemetry after surgery for Cardizem drip. PMH of afib on Eliquis, advanced dementia, HTN  Clinical Impression  Pt is a pleasant 83 year old female admitted for R hip hemiarthroplasty. Pt performed supine there-ex, reviewed hip precautions throughout session as pt has difficulty remembering. Pt performed bed mobility with mod assist, requires continuous cues and feedback throughout to rise from supine to seated at EOB. Pt able to sit at EOB however did not appear safe to transfer/amb due to pt's legs shaking, pain, and pt's anxiety to move R hip. Pt's daughter was present throughout session, would occasionally encourage patient during the session. Pt demonstrates deficits with LE strength, endurance, bed mobility, transfers, and amb. Would benefit from skilled PT to address above deficits and promote optimal return to home. Recommend transition to SNF upon DC from acute hospitalization.     Follow Up Recommendations SNF    Equipment Recommendations  None recommended by PT    Recommendations for Other Services       Precautions / Restrictions Precautions Precautions: Posterior Hip;Fall Precaution Booklet Issued: No Precaution Comments: hip abduction block Restrictions Weight Bearing Restrictions: Yes RLE Weight Bearing: Weight bearing as tolerated Other Position/Activity Restrictions: abduction pillow      Mobility  Bed Mobility Overal bed mobility: Needs Assistance Bed Mobility: Supine to Sit     Supine to sit: Min assist     General bed mobility comments: Pt required min assist to rise from supine to sit, required continuous cues for hand and LE placement, min assist to help guide RLE off bed. Able to utilize  bed rail and push hands on bed to scoot forward. Pt required significant rest breaks and encouragement throughout due to pain. Responded best to one step commands and feedback.   Transfers                 General transfer comment: Deferred, when seated at EOB pt's hands and legs were shaking due to weakness, pain, and anxiety to move R hip. Pt did not appear safe to transfer.   Ambulation/Gait             General Gait Details: Deferred  Stairs            Wheelchair Mobility    Modified Rankin (Stroke Patients Only)       Balance Overall balance assessment: Needs assistance Sitting-balance support: Feet supported Sitting balance-Leahy Scale: Good Sitting balance - Comments: Pt able to sit at EOB with no assistance, reminded of hip precautions, cued to keep R knee extended in front of body. Pt able to maintain position for several minutes at EOB with no assistance, pt able to maintain conversation. Practiced pursed lip breathing. No dizziness or LOB.        Standing balance comment: Deferred                             Pertinent Vitals/Pain Pain Assessment: Faces Pain Score: 6  Faces Pain Scale: Hurts even more Pain Location: R hip, pt states "it feels like needles in my hip" Pain Descriptors / Indicators: Aching;Grimacing Pain Intervention(s): Limited activity within patient's tolerance;Monitored during session;Repositioned;Premedicated before session (pursed lip breathing)    Home Living Family/patient  expects to be discharged to:: Private residence Living Arrangements: Alone Available Help at Discharge: Available 24 hours/day;Personal care attendant Type of Home: House Home Access: Stairs to enter Entrance Stairs-Rails: Right;Left;Can reach both Entrance Stairs-Number of Steps: 4 steps to enter (primary entrance) Home Layout: One level Home Equipment: Walker - 4 wheels;Shower seat;Grab bars - tub/shower;Cane - single point;Cane -  quad;Walker - 2 wheels;Toilet riser;Wheelchair - manual      Prior Function Level of Independence: Needs assistance   Gait / Transfers Assistance Needed: Pt modified independent ambulating with rollator with PCA present 24/7  ADL's / Homemaking Assistance Needed: PCA assists with all ADLs  Comments: Pt amb with rollator at baseline, PCA available 24/7      Hand Dominance        Extremity/Trunk Assessment   Upper Extremity Assessment Upper Extremity Assessment: Generalized weakness (UE MMT grossly 4/5)    Lower Extremity Assessment Lower Extremity Assessment: Generalized weakness (LUE MMT grossly 4/5, able to test R ) RLE: Unable to fully assess due to pain    Cervical / Trunk Assessment Cervical / Trunk Assessment: Normal  Communication   Communication: No difficulties  Cognition Arousal/Alertness: Awake/alert Behavior During Therapy: WFL for tasks assessed/performed Overall Cognitive Status: History of cognitive impairments - at baseline                                 General Comments: Pt's history obtained from daughter, pt has difficulty recalling information, had to be reminded of precautions throughout. Performs best with one step commands.       General Comments General comments (skin integrity, edema, etc.): foley cath removed by RN during session, abduction pillow in place at start and at end of session along with SCDs    Exercises General Exercises - Lower Extremity Ankle Circles/Pumps: AROM;Both;10 reps;Supine Heel Slides: AAROM;Right;Left;5 reps;Supine Other Exercises Other Exercises: Supine ther-ex 10x both sides, ankle pumps, hip abd/add, glute sets, SLRs. Cues for proper technique, min assist to initiate RLE exercises.  Other Exercises: pt/family educated in importance of hydration and facilitated problem solving to minimize risk of dehydration including compensatory strategies and visual cues.  Other Exercises: pt/family educated in  posterior total hip precautions, daughter verbalized understanding. Pt would benefit from repeated verbal cues and reminders to support recall and carryover of learned techniques.   Assessment/Plan    PT Assessment Patient needs continued PT services  PT Problem List Decreased strength;Decreased range of motion;Decreased activity tolerance;Decreased balance;Decreased mobility;Decreased knowledge of use of DME;Decreased knowledge of precautions;Pain       PT Treatment Interventions DME instruction;Gait training;Stair training;Functional mobility training;Therapeutic activities;Therapeutic exercise;Balance training;Patient/family education    PT Goals (Current goals can be found in the Care Plan section)  Acute Rehab PT Goals Patient Stated Goal: to go home PT Goal Formulation: With patient Time For Goal Achievement: 06/19/17 Potential to Achieve Goals: Good    Frequency BID   Barriers to discharge        Co-evaluation               AM-PAC PT "6 Clicks" Daily Activity  Outcome Measure Difficulty turning over in bed (including adjusting bedclothes, sheets and blankets)?: Unable Difficulty moving from lying on back to sitting on the side of the bed? : Unable Difficulty sitting down on and standing up from a chair with arms (e.g., wheelchair, bedside commode, etc,.)?: Unable Help needed moving to and from a bed to chair (including  a wheelchair)?: A Lot Help needed walking in hospital room?: A Lot Help needed climbing 3-5 steps with a railing? : A Lot 6 Click Score: 9    End of Session   Activity Tolerance: Patient tolerated treatment well;Patient limited by pain Patient left: in bed;with call bell/phone within reach;with bed alarm set;with family/visitor present Nurse Communication:  (to change gown and sheets) PT Visit Diagnosis: Unsteadiness on feet (R26.81);Other abnormalities of gait and mobility (R26.89);Muscle weakness (generalized) (M62.81);History of falling  (Z91.81);Pain Pain - Right/Left: Right Pain - part of body:  (hip)    Time: 1191-4782 PT Time Calculation (min) (ACUTE ONLY): 46 min   Charges:   PT Evaluation $PT Eval Moderate Complexity: 1 Mod PT Treatments $Therapeutic Exercise: 8-22 mins $Therapeutic Activity: 8-22 mins   PT G Codes:   PT G-Codes **NOT FOR INPATIENT CLASS** Functional Assessment Tool Used: AM-PAC 6 Clicks Basic Mobility Functional Limitation: Mobility: Walking and moving around Mobility: Walking and Moving Around Current Status (N5621): At least 60 percent but less than 80 percent impaired, limited or restricted Mobility: Walking and Moving Around Goal Status [phone]): At least 40 percent but less than 60 percent impaired, limited or restricted    Renford Dills, SPT  Renford Dills 06/05/2017, 5:31 PM

## 2017-06-05 NOTE — Consult Note (Signed)
[facility] CARDIOLOGY [facility]  CARDIOLOGY CONSULT NOTE  Patient ID: Paula Thomas MRN: [id number] DOB/AGE: 1932-02-05 83 y.o.  Admit date: 06/02/2017 Referring Physician Dr. Karlene Lineman Primary Physician   Primary Cardiologist Dr. Juliann Pares Reason for Consultation afib  HPI: Patient is a 83 year old female with history of chronic atrial fibrillation treated with anticoagulation with Eliquis and rate control with diltiazem at 240 mg daily. She has a history of falls and apparent syncope. She is a difficult historian due to her memory disorder. She was admitted with right leg discomfort and underwent surgical repair of her right femoral neck fracture. During the surgery and postop she had fairly rapid ventricular response on her A. fib. She was treated with IV Cardizem for a time and has today been converted back to by mouth Cardizem. Her rate is in better control. She is hemodynamically stable.  Review of Systems  Unable to perform ROS: Mental acuity    Past Medical History:  Diagnosis Date  . Atrial fibrillation (HCC)   . Fatigue   . Hypertension   . Syncope   . Underweight     Family History  Problem Relation Age of Onset  . Cancer Mother   . Cancer Father   . Heart attack Brother   . Heart attack Brother     Social History   Social History  . Marital status: Widowed    Spouse name: N/A  . Number of children: N/A  . Years of education: N/A   Occupational History  . Not on file.   Social History Main Topics  . Smoking status: Former Games developer  . Smokeless tobacco: Never Used  . Alcohol use No  . Drug use: No  . Sexual activity: No   Other Topics Concern  . Not on file   Social History Narrative  . No narrative on file    Past Surgical History:  Procedure Laterality Date  . ABDOMINAL HYSTERECTOMY    . EYE SURGERY Bilateral 2012   cataracts  . HERNIA REPAIR    . HIP ARTHROPLASTY Right 06/04/2017   Procedure: ARTHROPLASTY  BIPOLAR HIP (HEMIARTHROPLASTY);  Surgeon: Signa Kell, MD;  Location: [facility] ORS;  Service: Orthopedics;  Laterality: Right;  . SPLENECTOMY    . TONSILLECTOMY       Prescriptions Prior to Admission  Medication Sig Dispense Refill Last Dose  . apixaban (ELIQUIS) 2.5 MG TABS tablet Take 2.5 mg by mouth 2 (two) times daily.   06/02/2017 at 0800  . Cholecalciferol (VITAMIN D3 PO) Take 50 mcg by mouth daily.   06/02/2017 at Unknown time  . diltiazem (CARDIZEM CD) 240 MG 24 hr capsule Take 240 mg by mouth daily.   06/02/2017 at 0800  . hydrochlorothiazide (MICROZIDE) 12.5 MG capsule Take 12.5 mg by mouth daily.   06/02/2017 at 0800  . losartan (COZAAR) 50 MG tablet Take 1 tablet (50 mg total) by mouth daily. 90 tablet 3 06/02/2017 at 0800  . memantine (NAMENDA) 5 MG tablet Take 5 mg by mouth daily.   06/02/2017 at 0800  . Multiple Vitamins-Minerals (HM MULTIVITAMIN ADULT GUMMY PO) Take 1 tablet by mouth daily.    06/02/2017 at Unknown time  . acetaminophen (TYLENOL 8 HOUR ARTHRITIS PAIN) 650 MG CR tablet Take 650 mg by mouth every 8 (eight) hours as needed for pain.   PRN at PRN    Physical Exam: Blood pressure (!) 114/58, pulse 66, temperature 98.1 F (36.7 C), temperature source Oral, resp.  rate 18, height  (1.727 m), weight 50.4 kg (111 lb 3.2 oz), SpO2 97 %.   Wt Readings from Last 1 Encounters:  06/04/17 50.4 kg (111 lb 3.2 oz)     General appearance: cooperative and slowed mentation Resp: clear to auscultation bilaterally Chest wall: no tenderness Cardio: irregularly irregular rhythm GI: soft, non-tender; bowel sounds normal; no masses,  no organomegaly Neurologic: Grossly normal  Labs:   Lab Results  Component Value Date   WBC 14.5 (H) 06/05/2017   HGB 12.2 06/05/2017   HCT 36.3 06/05/2017   MCV 91.3 06/05/2017   PLT 359 06/05/2017    Recent Labs Lab 06/02/17 1313  06/05/17 0400  NA 140  < > 138  K 3.5  < > 3.9  CL 99*  < > 105  CO2 29  < > 25  BUN 14  < > 16    CREATININE 0.81  < > 0.69  CALCIUM 9.4  < > 8.7*  PROT 7.4  --   --   BILITOT 0.4  --   --   ALKPHOS 123  --   --   ALT 21  --   --   AST 26  --   --   GLUCOSE 94  < > 153*  < > = values in this interval not displayed. Lab Results  Component Value Date   CKTOTAL 89 08/17/2014   CKMB 1.5 08/17/2014   TROPONINI <0.03 06/02/2017       EKG: Atrial fibrillation with variable with occasional rapid ventricular response. This has improved.  ASSESSMENT AND PLAN:  Patient with history of chronic atrial fibrillation treated with anticoagulation with eliquis despite some falling has had no obvious head trauma. Patient is also recently had rapid ventricular response of atrial fibrillation during her surgical intervention. Rate is improved. Would agree with stopping diltiazem IV and placing back on by mouth. Would presume Eliquis acceptable from a orthopedic standpoint at previous dose. We'll follow her rate with you. For breakthrough tachyarrhythmias would add short acting 30 mg doses of Cardizem if needed. Signed: Dalia Heading MD, [facility] 06/05/2017, 4:59 PM

## 2017-06-05 NOTE — Evaluation (Signed)
Occupational Therapy Evaluation Patient Details Name: Paula Thomas MRN: [id number] DOB: [birth year] Today's Date: 06/05/2017    History of Present Illness 84yo female pt POD#1, s/p R hip hemiarthroplasty (posterior THPs, WBAT) on 10/3 secondary to fall approx 1 month ago. Transfered to telemetry unit after surgery due to rapid A-fib requiring Cardizem drip. PMHX significant for dementia, A-fib, HTN, syncope, and multiple falls. Cardizem drip discontinued as of 10/4 12:52pm.   Clinical Impression   Pt seen for OT evaluation this date. Pt is a pleasant 83 year old female s/p R hip hemiarthroplasty with posterior total hip precautions and RLE WBAT. Pt lives at home with 24/7 PCA assist for cooking, cleaning, bathing, and since fall approx 1 month assist for dressing. Prior to most recent fall pt was able to dress herself and has been ambulating with a rollator or 2WW modified independently. Son handles medication set up/mgt. Pt alert and oriented x4 during session with mild difficulty at times recalling information (dementia at baseline). Pt limited by pain this session, RN provided pain meds during session and also removed foley cath prior to bed mobility. Pt/daughter educated in posterior THPs, bladder mgt/dehydration mgt, and pursed lip breathing. Pt able to scoot up in bed with min assist, 1-step short verbal cues for hand placement on bed rails, and cue to push through L foot on bed. Pt is currently limited in functional ADLs due to pain, decreased ROM, and decreased activity tolerance.  Pt requires maximum assist for LB dressing and bathing skills due to pain and decreased AROM of R LE.  Heart rate and O2 sats on room air stable throughout session, pt primarily pain limited this date. Pt would benefit from continued skilled OT services for education in assistive devices, functional mobility, and education in recommendations for home modifications to increase safety and prevent falls.  Pt is a good  candidate for SNF to continue rehabilitation.      Follow Up Recommendations  SNF    Equipment Recommendations  Other (comment);3 in 1 bedside commode (reacher, hh shower head)    Recommendations for Other Services       Precautions / Restrictions Precautions Precautions: Posterior Hip;Fall Precaution Booklet Issued: No Restrictions Weight Bearing Restrictions: Yes RLE Weight Bearing: Weight bearing as tolerated Other Position/Activity Restrictions: abduction pillow      Mobility Bed Mobility Overal bed mobility: Needs Assistance Bed Mobility:  (scooting up in bed)           General bed mobility comments: Pt required min assist with HOB lowered, 1 step verbal cues for hand placement on bed rails and foot placement on bed to assist in pulling herself up in bed, able to scoot up approx 6 inches with significant effort and pain noted in R hip  Transfers                 General transfer comment: deferred due to pt's cognition/safety, fatigue, and pain    Balance Overall balance assessment: History of Falls (unable to assess this date)                                         ADL either performed or assessed with clinical judgement   ADL Overall ADL's : Needs assistance/impaired Eating/Feeding: Bed level;Set up   Grooming: Bed level;Set up   Upper Body Bathing: Bed level;Minimal assistance;Moderate assistance   Lower Body Bathing: Bed level;Maximal  assistance   Upper Body Dressing : Bed level;Minimal assistance;Moderate assistance   Lower Body Dressing: Bed level;Maximal assistance                       Vision Baseline Vision/History: Wears glasses Wears Glasses: Reading only Patient Visual Report: No change from baseline Vision Assessment?: No apparent visual deficits     Perception     Praxis      Pertinent Vitals/Pain Pain Assessment: Faces Faces Pain Scale: Hurts whole lot Pain Location: R hip, unable to provide  meaningful number to rate pain, pt tearful Pain Descriptors / Indicators: Aching;Grimacing Pain Intervention(s): Limited activity within patient's tolerance;Monitored during session;Patient requesting pain meds-RN notified;Repositioned     Hand Dominance     Extremity/Trunk Assessment Upper Extremity Assessment Upper Extremity Assessment: Overall WFL for tasks assessed (grossly 4+/5 bilaterally, grossly WFL for age)   Lower Extremity Assessment Lower Extremity Assessment: Defer to PT evaluation;Generalized weakness;RLE deficits/detail (LLE at least 3+/5) RLE: Unable to fully assess due to pain   Cervical / Trunk Assessment Cervical / Trunk Assessment: Normal   Communication Communication Communication: No difficulties   Cognition Arousal/Alertness: Awake/alert Behavior During Therapy: WFL for tasks assessed/performed Overall Cognitive Status: History of cognitive impairments - at baseline                                 General Comments: Pt pleasant, alert and oriented x4, difficult time recalling some information during session, did very well with 1 step commands when presented with time to process and perform, does better with clear, slow articulation when speaking to her.    General Comments  foley cath removed by RN during session, abduction pillow in place at start and at end of session along with SCDs    Exercises General Exercises - Lower Extremity Ankle Circles/Pumps: AROM;Both;10 reps;Supine Heel Slides: AAROM;Right;Left;5 reps;Supine Other Exercises Other Exercises: pt/family educated in pursed lip breathing to support pain mgt with verbal instruction and video demonstration with pt able to return demonstrate with verbal cue to use and continue using, as pt has tendency to hold her breathe when in pain Other Exercises: pt/family educated in importance of hydration and facilitated problem solving to minimize risk of dehydration including compensatory  strategies and visual cues.  Other Exercises: pt/family educated in posterior total hip precautions, daughter verbalized understanding. Pt would benefit from repeated verbal cues and reminders to support recall and carryover of learned techniques.   Shoulder Instructions      Home Living Family/patient expects to be discharged to:: Private residence Living Arrangements: Alone (24/7 PCA) Available Help at Discharge: Family;Personal care attendant (son and daughter actively involved, PCA 24/7) Type of Home: House Home Access: Stairs to enter [employer] of Steps: 4 from the side of the house (pt's primary entrance) with bilat handrails (can reach both); 5 steps from front entrance of the home (doesn't use) Entrance Stairs-Rails: Right;Left;Can reach both Home Layout: One level (has 1 step from den to living room)     Bathroom Shower/Tub: Arts development officer Toilet: Handicapped height     Home Equipment: Environmental consultant - 4 wheels;Shower seat;Grab bars - tub/shower;Cane - single point;Cane - quad;Walker - 2 wheels;Toilet riser;Wheelchair - manual (toilet riser with bilateral handrails)          Prior Functioning/Environment Level of Independence: Needs assistance  Gait / Transfers Assistance Needed: Pt modified independent ambulating with rollator with  PCA present 24/7 ADL's / Homemaking Assistance Needed: PCA assists with bathing, cooking, cleaning; since fall approx 1 month ago leading to hip pain, pt was able to dress herself, since then has required assist from PCA. Son provides medication set up   Comments: daughter confirms 4+ falls in past 12 months, dehydration being the cause of at least 1 of them.        OT Problem List: Decreased strength;Pain;Cardiopulmonary status limiting activity;Decreased range of motion;Decreased activity tolerance;Decreased safety awareness;Impaired balance (sitting and/or standing);Decreased knowledge of use of DME or AE;Decreased knowledge  of precautions      OT Treatment/Interventions: Self-care/ADL training;Therapeutic exercise;Therapeutic activities;DME and/or AE instruction;Patient/family education    OT Goals(Current goals can be found in the care plan section) Acute Rehab OT Goals Patient Stated Goal: get better and go home OT Goal Formulation: With patient/family Time For Goal Achievement: 06/19/17 Potential to Achieve Goals: Good  OT Frequency: Min 1X/week   Barriers to D/C:            Co-evaluation              AM-PAC PT "6 Clicks" Daily Activity     Outcome Measure Help from another person eating meals?: None Help from another person taking care of personal grooming?: A Little Help from another person toileting, which includes using toliet, bedpan, or urinal?: A Lot Help from another person bathing (including washing, rinsing, drying)?: A Lot Help from another person to put on and taking off regular upper body clothing?: A Little Help from another person to put on and taking off regular lower body clothing?: A Lot 6 Click Score: 16   End of Session Nurse Communication: Patient requests pain meds (RN provided pain meds during session)  Activity Tolerance: Patient limited by pain Patient left: in bed;with call bell/phone within reach;with bed alarm set;with SCD's reapplied;Other (comment) (abduction pillow in place)  OT Visit Diagnosis: Other abnormalities of gait and mobility (R26.89);Repeated falls (R29.6);Pain;Muscle weakness (generalized) (M62.81) Pain - Right/Left: Right Pain - part of body: Hip                Time: 1610-9604 OT Time Calculation (min): 51 min Charges:  OT General Charges $OT Visit: 1 Visit OT Evaluation $OT Eval Moderate Complexity: 1 Mod OT Treatments $Therapeutic Activity: 8-22 mins $Therapeutic Exercise: 8-22 mins G-Codes: OT G-codes **NOT FOR INPATIENT CLASS** Functional Assessment Tool Used: AM-PAC 6 Clicks Daily Activity;Clinical judgement Functional Limitation:  Self care Self Care Current Status (V4098): At least 60 percent but less than 80 percent impaired, limited or restricted Self Care Goal Status (J1914): At least 40 percent but less than 60 percent impaired, limited or restricted   Richrd Prime, MPH, MS, OTR/L ascom [phone] 06/05/17, 3:16 PM

## 2017-06-05 NOTE — Progress Notes (Signed)
[facility] [place] [facility]   PATIENT NAME: Paula Thomas    MR#:  [id number]  DATE OF BIRTH:  1932-03-09  SUBJECTIVE:  CHIEF COMPLAINT:   Chief Complaint  Patient presents with  . Hip Pain  Feels much better, daughter at bedside REVIEW OF SYSTEMS:  Review of Systems  Constitutional: Negative for chills, fever and weight loss.  HENT: Negative for nosebleeds and sore throat.   Eyes: Negative for blurred vision.  Respiratory: Negative for cough, shortness of breath and wheezing.   Cardiovascular: Negative for chest pain, orthopnea, leg swelling and PND.  Gastrointestinal: Negative for abdominal pain, constipation, diarrhea, heartburn, nausea and vomiting.  Genitourinary: Negative for dysuria and urgency.  Musculoskeletal: Positive for joint pain. Negative for back pain.  Skin: Negative for rash.  Neurological: Negative for dizziness, speech change, focal weakness and headaches.  Endo/Heme/Allergies: Does not bruise/bleed easily.  Psychiatric/Behavioral: Negative for depression.   DRUG ALLERGIES:   Allergies  Allergen Reactions  . Lisinopril Cough  . Penicillin G Potassium [Penicillin G]     Has patient had a PCN reaction causing immediate rash, facial/tongue/throat swelling, SOB or lightheadedness with hypotension: Unknown Has patient had a PCN reaction causing severe rash involving mucus membranes or skin necrosis: Unknown Has patient had a PCN reaction that required hospitalization: Unknown Has patient had a PCN reaction occurring within the last 10 years: Unknown If all of the above answers are "NO", then may proceed with Cephalosporin use.   . Sulfa Antibiotics    VITALS:  Blood pressure (!) 114/58, pulse 66, temperature 98.1 F (36.7 C), temperature source Oral, resp. rate 18, height  (1.727 m), weight 50.4 kg (111 lb 3.2 oz), SpO2 97 %. PHYSICAL EXAMINATION:  Physical Exam  Constitutional: She is oriented to person, place, and  time and well-developed, well-nourished, and in no distress.  HENT:  Head: Normocephalic and atraumatic.  Eyes: Pupils are equal, round, and reactive to light. Conjunctivae and EOM are normal.  Neck: Normal range of motion. Neck supple. No tracheal deviation present. No thyromegaly present.  Cardiovascular: Normal rate, regular rhythm and normal heart sounds.   Pulmonary/Chest: Effort normal and breath sounds normal. No respiratory distress. She has no wheezes. She exhibits no tenderness.  Abdominal: Soft. Bowel sounds are normal. She exhibits no distension. There is no tenderness.  Musculoskeletal:  Right hip area surgical dressing in place, no signs of infection around surgical site  Neurological: She is alert and oriented to person, place, and time. No cranial nerve deficit.  Skin: Skin is warm and dry. No rash noted.  Psychiatric: Mood and affect normal.   LABORATORY PANEL:  Female CBC  Recent Labs Lab 06/05/17 0400  WBC 14.5*  HGB 12.2  HCT 36.3  PLT 359   ------------------------------------------------------------------------------------------------------------------ Chemistries   Recent Labs Lab 06/02/17 1313  06/05/17 0400  NA 140  < > 138  K 3.5  < > 3.9  CL 99*  < > 105  CO2 29  < > 25  GLUCOSE 94  < > 153*  BUN 14  < > 16  CREATININE 0.81  < > 0.69  CALCIUM 9.4  < > 8.7*  AST 26  --   --   ALT 21  --   --   ALKPHOS 123  --   --   BILITOT 0.4  --   --   < > = values in this interval not displayed. RADIOLOGY:  Dg Pelvis Portable  Result  Date: 06/04/2017 CLINICAL DATA:  Postoperative right hip replacement. EXAM: PORTABLE PELVIS 1-2 VIEWS COMPARISON:  06/04/2017 FINDINGS: Right hip hemiarthroplasty using non cemented femoral component. Component appears well seated. No evidence of acute fracture or dislocation. Soft tissue emphysema is consistent with recent surgery. Vascular calcifications are noted. IMPRESSION: Right hip hemiarthroplasty. Component appears  well seated. No acute complication identified radiographically. Soft tissue gas consistent with recent surgery. Electronically Signed   By: Burman Nieves M.D.   On: 06/04/2017 22:09   Dg Pelvis Portable  Result Date: 06/04/2017 CLINICAL DATA:  Right hip fracture. EXAM: PORTABLE PELVIS 1-2 VIEWS COMPARISON:  Pelvic x-rays dated June 02, 2017. FINDINGS: Single AP view of the pelvis demonstrating ongoing right hip hemiarthroplasty, with appropriate positioning of the femoral stem. The bones are osteopenic. IMPRESSION: Ongoing right hip hemiarthroplasty. Electronically Signed   By: Obie Dredge M.D.   On: 06/04/2017 20:45   ASSESSMENT AND PLAN:  83 year old female with dementia and chronic atrial fibrillation on anticoagulation who suffered a fall about a month and a half ago who initially came in with left-sided pain and bruising which negative x-rays however has had persistent right-sided hip pain and now has a fracture seen on x-ray.  1. Right hip pain: due to fracture -status post right hip hemiarthroplasty POD1   2. Chronic atrial fibrillation:  -Went into rapid A. fib post op, requiring transfer to telemetry floor yesterday and start of Cardizem drip -Her rate is much better controlled now with a heart rate anywhere from 80s-90s.  She is off Cardizem drip and back on Cardizem CD 240 mg once daily. -Resume Eliquis -Appreciate cardiology input  3. Essential hypertension: Continue diltiazem, HCTZ and losartan  4. Hypokalemia: - RepleteD and reSOLVED  5. Dementia: Continue Namenda     All the records are reviewed and case discussed with Care Management/Social Worker. Management plans discussed with the patient, nursing, family(daughter at bedside) and they are in agreement.  CODE STATUS: DNR  TOTAL TIME TAKING CARE OF THIS PATIENT:  35 minutes.   More than 50% of the time was spent in counseling/coordination of care: YES  POSSIBLE D/C IN 1-2 DAYS, DEPENDING ON CLINICAL  CONDITION.    Delfino Lovett M.D on 06/05/2017 at 5:27 PM  Between 7am to 6pm - Pager - [phone]  After 6pm go to [email or web address] - Social research officer, government  [facility]  Office  [phone]  CC: Primary care physician; Gabriel Cirri, NP  Note: This dictation was prepared with Dragon dictation along with smaller phrase technology. Any transcriptional errors that result from this process are unintentional.

## 2017-06-05 NOTE — Clinical Social Work Note (Signed)
CSW received consult for patient needing SNF for short term rehab.  CSW awaiting PT and OT recommendations.  Ervin Knack. Analleli Gierke, MSW, Theresia Majors [phone]  06/05/2017 12:07 PM

## 2017-06-06 ENCOUNTER — Ambulatory Visit: Payer: Medicare Other | Admitting: Unknown Physician Specialty

## 2017-06-06 LAB — BASIC METABOLIC PANEL
ANION GAP: 10 (ref 5–15)
BUN: 14 mg/dL (ref 6–20)
CO2: 27 mmol/L (ref 22–32)
Calcium: 9.1 mg/dL (ref 8.9–10.3)
Chloride: 102 mmol/L (ref 101–111)
Creatinine, Ser: 0.68 mg/dL (ref 0.44–1.00)
GFR calc Af Amer: >60 mL/min (ref >60)
Glucose, Bld: 112 mg/dL — ABNORMAL HIGH (ref 65–99)
POTASSIUM: 3.9 mmol/L (ref 3.5–5.1)
SODIUM: 139 mmol/L (ref 135–145)

## 2017-06-06 LAB — CBC
HEMATOCRIT: 32.5 % — AB (ref 35.0–47.0)
Hemoglobin: 11.3 g/dL — ABNORMAL LOW (ref 12.0–16.0)
MCH: 30.8 pg (ref 26.0–34.0)
MCHC: 34.6 g/dL (ref 32.0–36.0)
MCV: 88.8 fL (ref 80.0–100.0)
PLATELETS: 349 10*3/uL (ref 150–440)
RBC: 3.66 MIL/uL — ABNORMAL LOW (ref 3.80–5.20)
RDW: 14.5 % (ref 11.5–14.5)
WBC: 20.4 10*3/uL — ABNORMAL HIGH (ref 3.6–11.0)

## 2017-06-06 LAB — SURGICAL PATHOLOGY

## 2017-06-06 MED ORDER — OXYCODONE HCL 5 MG PO TABS: 5.0000 mg | ORAL_TABLET | ORAL | 0 refills | Status: DC | PRN

## 2017-06-06 MED ORDER — ENOXAPARIN SODIUM 40 MG/0.4ML ~~LOC~~ SOLN: 40.0000 mg | SUBCUTANEOUS | 0 refills | Status: DC

## 2017-06-06 NOTE — Care Management Important Message (Signed)
Important Message  Patient Details  Name: Paula Thomas MRN: [id number] Date of Birth: 12/23/1931   Medicare Important Message Given:  Yes    Marily Memos, RN 06/06/2017, 8:49 AM

## 2017-06-06 NOTE — Progress Notes (Signed)
Patient is being discharged to [facility]. Report given to [place]. Patient's belongings packed, IV removed. EMS called. AVS printed for patient.

## 2017-06-06 NOTE — Progress Notes (Addendum)
Clinical Social Worker (CSW) contacted patient's daughter Andria Rhein to get SNF choice. Daughter chose [facility]. Patient is medically stable for D/C to [facility] today. Per [facility] admissions coordinator at [facility] patinet will go to room B-9. RN will call report and arrange EMS for transport. Clinical Child psychotherapist (CSW) sent D/C orders to [facility] via [employer]. Patient's daughter Andria Rhein is aware of above. Please reconsult if future social work needs arise. CSW signing off.   [employer], LCSW [phone]

## 2017-06-06 NOTE — Discharge Instructions (Signed)
POSTERIOR TOTAL HIP REPLACEMENT POSTOPERATIVE DIRECTIONS ° °Hip Rehabilitation, Guidelines Following Surgery  °The results of a hip operation are greatly improved after range of motion and muscle strengthening exercises. Follow all safety measures which are given to protect your hip. If any of these exercises cause increased pain or swelling in your joint, decrease the amount until you are comfortable again. Then slowly increase the exercises. Call your caregiver if you have problems or questions.  ° °HOME CARE INSTRUCTIONS  °Remove items at home which could result in a fall. This includes throw rugs or furniture in walking pathways.  °· ICE to the affected hip every three hours for 30 minutes at a time and then as needed for pain and swelling.  Continue to use ice on the hip for pain and swelling from surgery. You may notice swelling that will progress down to the foot and ankle.  This is normal after surgery.  Elevate the leg when you are not up walking on it.   °· Continue to use the breathing machine which will help keep your temperature down.  It is common for your temperature to cycle up and down following surgery, especially at night when you are not up moving around and exerting yourself.  The breathing machine keeps your lungs expanded and your temperature down. ° °DIET °You may resume your previous home diet once your are discharged from the hospital. ° °DRESSING / WOUND CARE / SHOWERING °Keep the surgical dressing until follow up.  The dressing is water proof, so you can shower without any extra covering.  IF THE DRESSING FALLS OFF or the wound gets wet inside, change the dressing with sterile gauze.  Please use good hand washing techniques before changing the dressing.  Do not use any lotions or creams on the incision until instructed by your surgeon.   °You need to keep your dressing dry after discharge.   °Change the surgical dressing if needed with Physical Therapy and reapply a dry dressing each  time. ° ° ° °ACTIVITY °Walk with your walker as instructed. °Use walker as long as suggested by your caregivers. °Avoid periods of inactivity such as sitting longer than an hour when not asleep. This helps prevent blood clots.  °You may resume a sexual relationship in one month or when given the OK by your doctor.  °You may return to work once you are cleared by your doctor.  °Do not drive a car for 6 weeks or until released by you surgeon.  °Do not drive while taking narcotics. ° °WEIGHT BEARING °Weight bearing as tolerated with assist device (walker, cane, etc) as directed, use it as long as suggested by your surgeon or therapist, typically at least 4-6 weeks. ° °POSTOPERATIVE CONSTIPATION PROTOCOL °Constipation - defined medically as fewer than three stools per week and severe constipation as less than one stool per week. ° °One of the most common issues patients have following surgery is constipation.  Even if you have a regular bowel pattern at home, your normal regimen is likely to be disrupted due to multiple reasons following surgery.  Combination of anesthesia, postoperative narcotics, change in appetite and fluid intake all can affect your bowels.  In order to avoid complications following surgery, here are some recommendations in order to help you during your recovery period. ° °Colace (docusate) - Pick up an over-the-counter form of Colace or another stool softener and take twice a day as long as you are requiring postoperative pain medications.  Take with a   full glass of water daily.  If you experience loose stools or diarrhea, hold the colace until you stool forms back up.  If your symptoms do not get better within 1 week or if they get worse, check with your doctor. ° °Dulcolax (bisacodyl) - Pick up over-the-counter and take as directed by the product packaging as needed to assist with the movement of your bowels.  Take with a full glass of water.  Use this product as needed if not relieved by Colace  only.  ° °MiraLax (polyethylene glycol) - Pick up over-the-counter to have on hand.  MiraLax is a solution that will increase the amount of water in your bowels to assist with bowel movements.  Take as directed and can mix with a glass of water, juice, soda, coffee, or tea.  Take if you go more than two days without a movement. °Do not use MiraLax more than once per day. Call your doctor if you are still constipated or irregular after using this medication for 7 days in a row. ° °If you continue to have problems with postoperative constipation, please contact the office for further assistance and recommendations.  If you experience "the worst abdominal pain ever" or develop nausea or vomiting, please contact the office immediatly for further recommendations for treatment. ° °ITCHING ° If you experience itching with your medications, try taking only a single pain pill, or even half a pain pill at a time.  You can also use Benadryl over the counter for itching or also to help with sleep.  ° °TED HOSE STOCKINGS °Wear the elastic stockings on both legs for three weeks following surgery during the day but you may remove then at night for sleeping. ° °MEDICATIONS °See your medication summary on the “After Visit Summary” that the nursing staff will review with you prior to discharge.  You may have some home medications which will be placed on hold until you complete the course of blood thinner medication.  It is important for you to complete the blood thinner medication as prescribed by your surgeon.  Continue your approved medications as instructed at time of discharge. ° °PRECAUTIONS °If you experience chest pain or shortness of breath - call 911 immediately for transfer to the hospital emergency department.  °If you develop a fever greater that 101 F, purulent drainage from wound, increased redness or drainage from wound, foul odor from the wound/dressing, or calf pain - CONTACT YOUR SURGEON.   °                                                 °FOLLOW-UP APPOINTMENTS °Make sure you keep all of your appointments after your operation with your surgeon and caregivers. You should call the office at the above phone number and make an appointment for approximately two weeks after the date of your surgery or on the date instructed by your surgeon outlined in the "After Visit Summary". ° °RANGE OF MOTION AND STRENGTHENING EXERCISES  °These exercises are designed to help you keep full movement of your hip joint. Follow your caregiver's or physical therapist's instructions. Perform all exercises about fifteen times, three times per day or as directed. Exercise both hips, even if you have had only one joint replacement. These exercises can be done on a training (exercise) mat, on the floor, on a table or on a   bed. Use whatever works the best and is most comfortable for you. Use music or television while you are exercising so that the exercises are a pleasant break in your day. This will make your life better with the exercises acting as a break in routine you can look forward to.  °Lying on your back, slowly slide your foot toward your buttocks, raising your knee up off the floor. Then slowly slide your foot back down until your leg is straight again.  °Lying on your back spread your legs as far apart as you can without causing discomfort.  °Lying on your side, raise your upper leg and foot straight up from the floor as far as is comfortable. Slowly lower the leg and repeat.  °Lying on your back, tighten up the muscle in the front of your thigh (quadriceps muscles). You can do this by keeping your leg straight and trying to raise your heel off the floor. This helps strengthen the largest muscle supporting your knee.  °Lying on your back, tighten up the muscles of your buttocks both with the legs straight and with the knee bent at a comfortable angle while keeping your heel on the floor.  ° ° ° ° °IF YOU ARE TRANSFERRED TO A SKILLED REHAB  FACILITY °If the patient is transferred to a skilled rehab facility following release from the hospital, a list of the current medications will be sent to the facility for the patient to continue.  When discharged from the skilled rehab facility, please have the facility set up the patient's Home Health Physical Therapy prior to being released. Also, the skilled facility will be responsible for providing the patient with their medications at time of release from the facility to include their pain medication, the muscle relaxants, and their blood thinner medication. If the patient is still at the rehab facility at time of the two week follow up appointment, the skilled rehab facility will also need to assist the patient in arranging follow up appointment in our office and any transportation needs. ° °MAKE SURE YOU:  °Understand these instructions.  °Get help right away if you are not doing well or get worse.  ° ° °Pick up stool softner and laxative for home use following surgery while on pain medications. °Do not submerge incision under water. °Please use good hand washing techniques while changing dressing each day. °May shower starting three days after surgery. °Please use a clean towel to pat the incision dry following showers. °Continue to use ice for pain and swelling after surgery. °Do not use any lotions or creams on the incision until instructed by your surgeon. °

## 2017-06-06 NOTE — Progress Notes (Signed)
  Subjective: 2 Days Post-Op Procedure(s) (LRB): ARTHROPLASTY BIPOLAR HIP (HEMIARTHROPLASTY) (Right) Patient reports pain as mild.   Patient seen in rounds with Dr. Allena Katz. Patient is well, and has had no acute complaints or problems Plan is to go Skilled nursing facility after hospital stay. Negative for chest pain and shortness of breath Fever: no Gastrointestinal: Negative for nausea and vomiting  Objective: Vital signs in last 24 hours: Temp:  [97.4 F (36.3 C)-98.1 F (36.7 C)] 97.9 F (36.6 C) (10/04 1957) Pulse Rate:  [65-86] 86 (10/04 1957) Resp:  [16-18] 16 (10/04 1957) BP: (114-130)/(58-73) 117/63 (10/04 1957) SpO2:  [97 %-99 %] 97 % (10/04 1957)  Intake/Output from previous day:  Intake/Output Summary (Last 24 hours) at 06/06/17 0724 Last data filed at 06/06/17 0542  Gross per 24 hour  Intake          1656.17 ml  Output              850 ml  Net           806.17 ml    Intake/Output this shift: No intake/output data recorded.  Labs:  Recent Labs  06/04/17 0449 06/05/17 0400 06/06/17 0343  HGB 12.9 12.2 11.3*    Recent Labs  06/05/17 0400 06/06/17 0343  WBC 14.5* 20.4*  RBC 3.98 3.66*  HCT 36.3 32.5*  PLT 359 349    Recent Labs  06/05/17 0400 06/06/17 0343  NA 138 139  K 3.9 3.9  CL 105 102  CO2 25 27  BUN 16 14  CREATININE 0.69 0.68  GLUCOSE 153* 112*  CALCIUM 8.7* 9.1   No results for input(s): LABPT, INR in the last 72 hours.   EXAM General - Patient is Alert and Confused Extremity - Neurovascular intact Dorsiflexion/Plantar flexion intact No cellulitis present Compartment soft Dressing/Incision - clean, dry, no drainage Motor Function - intact, moving foot and toes well on exam.   Past Medical History:  Diagnosis Date  . Atrial fibrillation (HCC)   . Fatigue   . Hypertension   . Syncope   . Underweight     Assessment/Plan: 2 Days Post-Op Procedure(s) (LRB): ARTHROPLASTY BIPOLAR HIP (HEMIARTHROPLASTY) (Right) Active  Problems:   Hip fracture (HCC)  Estimated body mass index is 16.91 kg/m as calculated from the following:   Height as of this encounter:  (1.727 m).   Weight as of this encounter: 50.4 kg (111 lb 3.2 oz). Up with therapy Discharge to SNF when cleared medically.  DVT Prophylaxis - Lovenox, Foot Pumps and TED hose Weight-Bearing as tolerated to right leg  Dedra Skeens, PA-C Orthopaedic Surgery 06/06/2017, 7:24 AM

## 2017-06-06 NOTE — Progress Notes (Signed)
Physical Therapy Treatment Patient Details Name: Paula Thomas MRN: [id number] DOB: 1931-11-02 Today's Date: 06/06/2017    History of Present Illness Pt admitted for R hip hemiarthroplasty on 10/3, after sustaining hip fx one month ago from fall. Pt transferred to telemetry after surgery for Cardizem drip. PMH of afib on Eliquis, advanced dementia, HTN    PT Comments    Pt is making good progress towards goals. Pt performed supine there-ex, reviewed written HEP/precautions/WB status. Pt performed bed mobility with mod assist, transfers and amb with RW with max assist, +2 for safety. Pt requires additional time with bed mobility, requires frequent rest breaks and cues. Pt able to sit at EOB for several minutes. Pt amb a total of 2 ft, from seated EOB to recliner. Pt appeared apprehensive about transfers/amb, however appeared motivated to participate. Pt cued to perform pursed lip breathing throughout session to decrease anxiety.    Follow Up Recommendations  SNF     Equipment Recommendations  None recommended by PT    Recommendations for Other Services       Precautions / Restrictions Precautions Precautions: Posterior Hip;Fall Precaution Booklet Issued: Yes (comment) Restrictions Weight Bearing Restrictions: Yes RLE Weight Bearing: Weight bearing as tolerated    Mobility  Bed Mobility Overal bed mobility: Needs Assistance Bed Mobility: Supine to Sit     Supine to sit: Mod assist     General bed mobility comments: Pt required mod assist to rise from supine to seated EOB, required additional time as pt appeared to be anxious about moving RLE. Cues for body positioning, proper technique, hip precautions. Utilized bed rails, able to scoot forward. Pt required significant encouragement throughout.    Transfers Overall transfer level: Needs assistance Equipment used: Rolling walker (2 wheeled) Transfers: Sit to/from Stand Sit to Stand: Max assist;+2 physical assistance;+2  safety/equipment         General transfer comment: Pt able to rise to standing at EOB with RW, cues for proper sequencing/technique and hip precautions. Pt appeared anxious about placing weight on RLE, reminded of WBAT status. Pt appeared to rely heavily on RW for support. Cued for upright posture. Required time to find balance.   Ambulation/Gait Ambulation/Gait assistance: Max assist;+2 physical assistance Ambulation Distance (Feet): 2 Feet Assistive device: Rolling walker (2 wheeled) Gait Pattern/deviations: Step-to pattern     General Gait Details: Pt able to amb with RW with step-to gait, takes very small steps, requires continous cues for proper sequencing. Pt appeared unsteady on feet, required max assist for support and safety.    Stairs            Wheelchair Mobility    Modified Rankin (Stroke Patients Only)       Balance Overall balance assessment: Needs assistance Sitting-balance support: Feet supported Sitting balance-Leahy Scale: Good Sitting balance - Comments: Pt able to sit at EOB with no assistance for several minutes, reminded of hip precautions.    Standing balance support: Bilateral upper extremity supported Standing balance-Leahy Scale: Good Standing balance comment: Pt able to stand at EOB with RW and max assist, pt appeared unsteady on feet due to pain, weakness and anxiety with moving RLE.                             Cognition Arousal/Alertness: Awake/alert Behavior During Therapy: WFL for tasks assessed/performed Overall Cognitive Status: History of cognitive impairments - at baseline  Exercises General Exercises - Lower Extremity Heel Slides: Other (comment) Other Exercises Other Exercises: Supine ther-ex, 15x, B ankle pumps, B glute sets, R quad sets, R SLRs, R hip abd/add. Pt required cues for proper technique. CGA to help initiate exercises for RLE due to pain.      General Comments        Pertinent Vitals/Pain Pain Assessment: 0-10 Pain Score: 7  Pain Location: R hip Pain Descriptors / Indicators: Aching;Grimacing Pain Intervention(s): Limited activity within patient's tolerance;Monitored during session;Patient requesting pain meds-RN notified;Repositioned    Home Living                      Prior Function            PT Goals (current goals can now be found in the care plan section) Acute Rehab PT Goals Patient Stated Goal: to go home PT Goal Formulation: With patient Time For Goal Achievement: 06/19/17 Potential to Achieve Goals: Good Progress towards PT goals: Progressing toward goals    Frequency    BID      PT Plan Current plan remains appropriate    Co-evaluation              AM-PAC PT "6 Clicks" Daily Activity  Outcome Measure  Difficulty turning over in bed (including adjusting bedclothes, sheets and blankets)?: Unable Difficulty moving from lying on back to sitting on the side of the bed? : Unable Difficulty sitting down on and standing up from a chair with arms (e.g., wheelchair, bedside commode, etc,.)?: Unable Help needed moving to and from a bed to chair (including a wheelchair)?: A Lot Help needed walking in hospital room?: A Lot Help needed climbing 3-5 steps with a railing? : A Lot 6 Click Score: 9    End of Session Equipment Utilized During Treatment: Gait belt Activity Tolerance: Patient tolerated treatment well;Patient limited by pain Patient left: in chair;with call bell/phone within reach;with chair alarm set;with family/visitor present Nurse Communication: Mobility status PT Visit Diagnosis: Unsteadiness on feet (R26.81);Other abnormalities of gait and mobility (R26.89);Muscle weakness (generalized) (M62.81);Pain Pain - Right/Left: Right Pain - part of body:  (hip)     Time: 4696-2952 PT Time Calculation (min) (ACUTE ONLY): 46 min  Charges:                       G Codes:   Functional Assessment Tool Used: AM-PAC 6 Clicks Basic Mobility Functional Limitation: Mobility: Walking and moving around Mobility: Walking and Moving Around Current Status (W4132): At least 60 percent but less than 80 percent impaired, limited or restricted Mobility: Walking and Moving Around Goal Status [phone]): At least 40 percent but less than 60 percent impaired, limited or restricted    Renford Dills, SPT   Erle Crocker Saliha Salts 06/06/2017, 1:28 PM

## 2017-06-06 NOTE — Discharge Summary (Addendum)
[facility] -  at [facility]   PATIENT NAME: Paula Thomas    MR#:  [id number]  DATE OF BIRTH:  1932/06/01  DATE OF ADMISSION:  06/02/2017   ADMITTING PHYSICIAN: Adrian Saran, MD  DATE OF DISCHARGE: 06/06/2017  PRIMARY CARE PHYSICIAN: Gabriel Cirri, NP   ADMISSION DIAGNOSIS:  Closed subcapital fracture of right femur, initial encounter (HCC) [S72.011A] Recurrent syncope [R55] DISCHARGE DIAGNOSIS:  Active Problems:   Hip fracture (HCC)  SECONDARY DIAGNOSIS:   Past Medical History:  Diagnosis Date  . Atrial fibrillation (HCC)   . Fatigue   . Hypertension   . Syncope   . Underweight    HOSPITAL COURSE:  83 year old female with dementia and chronic atrial fibrillation on anticoagulation who suffered a fall about a month and a half ago who initially came in with left-sided pain and bruising which negative x-rays however has had persistent right-sided hip pain and found to have fracture seen on x-ray.  1.Right hip pain: due to fracture -status post right hip hemiarthroplasty POD2   2. Chronic atrial fibrillation:  -on cardizem CD for rate control and eliquis for long term anticoagulation  3. Essential hypertension: Continue diltiazem, HCTZ and losartan  4. Hypokalemia: - Repleted and resolved  5. Dementia: Continue Namenda  DISCHARGE CONDITIONS:  stable CONSULTS OBTAINED:  Treatment Team:  Signa Kell, MD Dalia Heading, MD DRUG ALLERGIES:   Allergies  Allergen Reactions  . Lisinopril Cough  . Penicillin G Potassium [Penicillin G]     Has patient had a PCN reaction causing immediate rash, facial/tongue/throat swelling, SOB or lightheadedness with hypotension: Unknown Has patient had a PCN reaction causing severe rash involving mucus membranes or skin necrosis: Unknown Has patient had a PCN reaction that required hospitalization: Unknown Has patient had a PCN reaction occurring within the last 10 years: Unknown If all of  the above answers are "NO", then may proceed with Cephalosporin use.   . Sulfa Antibiotics    DISCHARGE MEDICATIONS:   Allergies as of 06/06/2017      Reactions   Lisinopril Cough   Penicillin G Potassium [penicillin G]    Has patient had a PCN reaction causing immediate rash, facial/tongue/throat swelling, SOB or lightheadedness with hypotension: Unknown Has patient had a PCN reaction causing severe rash involving mucus membranes or skin necrosis: Unknown Has patient had a PCN reaction that required hospitalization: Unknown Has patient had a PCN reaction occurring within the last 10 years: Unknown If all of the above answers are "NO", then may proceed with Cephalosporin use.   Sulfa Antibiotics       Medication List    TAKE these medications   diltiazem 240 MG 24 hr capsule Commonly known as:  CARDIZEM CD Take 240 mg by mouth daily.   ELIQUIS 2.5 MG Tabs tablet Generic drug:  apixaban Take 2.5 mg by mouth 2 (two) times daily.   HM MULTIVITAMIN ADULT GUMMY PO Take 1 tablet by mouth daily.   hydrochlorothiazide 12.5 MG capsule Commonly known as:  MICROZIDE Take 12.5 mg by mouth daily.   losartan 50 MG tablet Commonly known as:  COZAAR Take 1 tablet (50 mg total) by mouth daily.   memantine 5 MG tablet Commonly known as:  NAMENDA Take 5 mg by mouth daily.   oxyCODONE 5 MG immediate release tablet Commonly known as:  Oxy IR/ROXICODONE Take 1 tablet (5 mg total) by mouth every 4 (four) hours as needed for moderate pain, severe pain or breakthrough pain.  TYLENOL 8 HOUR ARTHRITIS PAIN 650 MG CR tablet Generic drug:  acetaminophen Take 650 mg by mouth every 8 (eight) hours as needed for pain.   VITAMIN D3 PO Take 50 mcg by mouth daily.        DISCHARGE INSTRUCTIONS:  Weight-Bearing as tolerated to right leg DIET:  Regular diet DISCHARGE CONDITION:  Stable ACTIVITY:  Activity as tolerated OXYGEN:  Home Oxygen: No.  Oxygen Delivery: room air DISCHARGE  LOCATION:  nursing home   If you experience worsening of your admission symptoms, develop shortness of breath, life threatening emergency, suicidal or homicidal thoughts you must seek medical attention immediately by calling 911 or calling your MD immediately  if symptoms less severe.  You Must read complete instructions/literature along with all the possible adverse reactions/side effects for all the Medicines you take and that have been prescribed to you. Take any new Medicines after you have completely understood and accpet all the possible adverse reactions/side effects.   Please note  You were cared for by a hospitalist during your hospital stay. If you have any questions about your discharge medications or the care you received while you were in the hospital after you are discharged, you can call the unit and asked to speak with the hospitalist on call if the hospitalist that took care of you is not available. Once you are discharged, your primary care physician will handle any further medical issues. Please note that NO REFILLS for any discharge medications will be authorized once you are discharged, as it is imperative that you return to your primary care physician (or establish a relationship with a primary care physician if you do not have one) for your aftercare needs so that they can reassess your need for medications and monitor your lab values.    On the day of Discharge:  VITAL SIGNS:  Blood pressure 140/81, pulse 82, temperature 98.4 F (36.9 C), temperature source Oral, resp. rate 16, height  (1.727 m), weight 50.4 kg (111 lb 3.2 oz), SpO2 99 %. PHYSICAL EXAMINATION:  GENERAL:  83 y.o.-year-old patient lying in the bed with no acute distress.  EYES: Pupils equal, round, reactive to light and accommodation. No scleral icterus. Extraocular muscles intact.  HEENT: Head atraumatic, normocephalic. Oropharynx and nasopharynx clear.  NECK:  Supple, no jugular venous distention. No  thyroid enlargement, no tenderness.  LUNGS: Normal breath sounds bilaterally, no wheezing, rales,rhonchi or crepitation. No use of accessory muscles of respiration.  CARDIOVASCULAR: S1, S2 normal. No murmurs, rubs, or gallops.  ABDOMEN: Soft, non-tender, non-distended. Bowel sounds present. No organomegaly or mass.  EXTREMITIES: No pedal edema, cyanosis, or clubbing.  NEUROLOGIC: Cranial nerves II through XII are intact. Muscle strength 5/5 in all extremities. Sensation intact. Gait not checked.  PSYCHIATRIC: The patient is alert and oriented x 3.  SKIN: No obvious rash, lesion, or ulcer.  DATA REVIEW:   CBC  Recent Labs Lab 06/06/17 0343  WBC 20.4*  HGB 11.3*  HCT 32.5*  PLT 349    Chemistries   Recent Labs Lab 06/02/17 1313  06/06/17 0343  NA 140  < > 139  K 3.5  < > 3.9  CL 99*  < > 102  CO2 29  < > 27  GLUCOSE 94  < > 112*  BUN 14  < > 14  CREATININE 0.81  < > 0.68  CALCIUM 9.4  < > 9.1  AST 26  --   --   ALT 21  --   --  ALKPHOS 123  --   --   BILITOT 0.4  --   --   < > = values in this interval not displayed.   Follow-up Information    Signa Kell, MD Follow up in 2 week(s).   Specialty:  Orthopedic Surgery Why:  For staple removal Contact information: [address] [place] [address] [phone]        Gabriel Cirri, NP. Schedule an appointment as soon as possible for a visit in 1 week(s).   Specialties:  Nurse Practitioner, Family Medicine Contact information: [address] [place] [address] [phone]           Management plans discussed with the patient, nursing and they are in agreement.  CODE STATUS: DNR   TOTAL TIME TAKING CARE OF THIS PATIENT: 45 minutes.    Delfino Lovett M.D on 06/06/2017 at 8:40 AM  Between 7am to 6pm - Pager - [phone]  After 6pm go to [email or web address] - Social research officer, government  [facility]  Office  [phone]  CC: Primary care physician; Gabriel Cirri, NP   Note: This  dictation was prepared with Dragon dictation along with smaller phrase technology. Any transcriptional errors that result from this process are unintentional.

## 2017-06-06 NOTE — Clinical Social Work Placement (Signed)
   CLINICAL SOCIAL WORK PLACEMENT  NOTE  Date:  06/06/2017  Patient Details  Name: Paula Thomas MRN: [id number] Date of Birth: Aug 13, 1932  Clinical Social Work is seeking post-discharge placement for this patient at the Skilled  Nursing Facility level of care (*CSW will initial, date and re-position this form in  chart as items are completed):  Yes   Patient/family provided with [facility] Clinical Social Work Department's list of facilities offering this level of care within the geographic area requested by the patient (or if unable, by the patient's family).  Yes   Patient/family informed of their freedom to choose among providers that offer the needed level of care, that participate in Medicare, Medicaid or managed care program needed by the patient, have an available bed and are willing to accept the patient.  Yes   Patient/family informed of [facility]'s ownership interest in [facility] and [facility], as well as of the fact that they are under no obligation to receive care at these facilities.  PASRR submitted to EDS on 06/03/17     PASRR number received on 06/03/17     Existing PASRR number confirmed on       FL2 transmitted to all facilities in geographic area requested by pt/family on 06/03/17     FL2 transmitted to all facilities within larger geographic area on       Patient informed that his/her managed care company has contracts with or will negotiate with certain facilities, including the following:        Yes   Patient/family informed of bed offers received.  Patient chooses bed at  [facility] )     Physician recommends and patient chooses bed at      Patient to be transferred to  [facility] ) on 06/06/17.  Patient to be transferred to facility by  [facility] )     Patient family notified on 06/06/17 of transfer.  Name of family member notified:   (Patient's daughter [name] is aware of D/C today. )     PHYSICIAN       Additional  Comment:    _______________________________________________ Johncarlos Holtsclaw, Darleen Crocker, LCSW 06/06/2017, 9:51 AM

## 2017-06-09 NOTE — Anesthesia Postprocedure Evaluation (Signed)
Anesthesia Post Note  Patient: Paula Thomas  Procedure(s) Performed: ARTHROPLASTY BIPOLAR HIP (HEMIARTHROPLASTY) (Right Hip)  Anesthesia Type: General     Last Vitals:  Vitals:   06/06/17 0831 06/06/17 1524  BP: 140/81 122/66  Pulse: 82 65  Resp:    Temp: 36.9 C   SpO2: 99% 92%    Last Pain:  Vitals:   06/06/17 1407  TempSrc:   PainSc: 3                  Yevette Edwards

## 2017-06-09 NOTE — Anesthesia Postprocedure Evaluation (Signed)
Anesthesia Post Note  Patient: Chief Financial Officer  Procedure(s) Performed: ARTHROPLASTY BIPOLAR HIP (HEMIARTHROPLASTY) (Right Hip)  Patient location during evaluation: PACU Anesthesia Type: General Level of consciousness: awake and alert Pain management: pain level controlled Vital Signs Assessment: post-procedure vital signs reviewed and stable Respiratory status: spontaneous breathing, nonlabored ventilation, respiratory function stable and patient connected to nasal cannula oxygen Cardiovascular status: blood pressure returned to baseline and stable Postop Assessment: no apparent nausea or vomiting Anesthetic complications: no     Last Vitals:  Vitals:   06/06/17 0831 06/06/17 1524  BP: 140/81 122/66  Pulse: 82 65  Resp:    Temp: 36.9 C   SpO2: 99% 92%    Last Pain:  Vitals:   06/06/17 1407  TempSrc:   PainSc: 3                  Yevette Edwards

## 2017-06-16 ENCOUNTER — Inpatient Hospital Stay: Payer: Medicare Other | Admitting: Unknown Physician Specialty

## 2017-06-30 ENCOUNTER — Telehealth: Payer: Self-pay | Admitting: Unknown Physician Specialty

## 2017-06-30 NOTE — Telephone Encounter (Signed)
Copied from CRM #[id number]. Topic: Quick Communication - See Telephone Encounter >> Jun 30, 2017  2:43 PM Paula Thomas, Paula Thomas wrote: CRM for notification. See Telephone encounter for:  06/30/17. Patient daughter called and had a new fax number to send FMLA paperwork to her and that the number is [phone], thanks.

## 2017-06-30 NOTE — Telephone Encounter (Signed)
Paperwork faxed to the number left by the patient's daughter. Called and let her know that this was done for her.

## 2017-08-31 ENCOUNTER — Encounter: Payer: Self-pay | Admitting: Emergency Medicine

## 2017-08-31 ENCOUNTER — Other Ambulatory Visit: Payer: Self-pay

## 2017-08-31 ENCOUNTER — Emergency Department: Payer: Medicare Other

## 2017-08-31 ENCOUNTER — Emergency Department
Admission: EM | Admit: 2017-08-31 | Discharge: 2017-08-31 | Disposition: A | Discharge status: Home / Self Care | Payer: Medicare Other | Attending: Emergency Medicine | Admitting: Emergency Medicine

## 2017-08-31 DIAGNOSIS — N183 Chronic kidney disease, stage 3 (moderate): Secondary | ICD-10-CM | POA: Diagnosis not present

## 2017-08-31 DIAGNOSIS — M25551 Pain in right hip: Secondary | ICD-10-CM | POA: Diagnosis not present

## 2017-08-31 DIAGNOSIS — Z79899 Other long term (current) drug therapy: Secondary | ICD-10-CM | POA: Insufficient documentation

## 2017-08-31 DIAGNOSIS — Z87891 Personal history of nicotine dependence: Secondary | ICD-10-CM | POA: Insufficient documentation

## 2017-08-31 DIAGNOSIS — G8918 Other acute postprocedural pain: Secondary | ICD-10-CM | POA: Insufficient documentation

## 2017-08-31 DIAGNOSIS — Z7901 Long term (current) use of anticoagulants: Secondary | ICD-10-CM | POA: Diagnosis not present

## 2017-08-31 DIAGNOSIS — I129 Hypertensive chronic kidney disease with stage 1 through stage 4 chronic kidney disease, or unspecified chronic kidney disease: Secondary | ICD-10-CM | POA: Insufficient documentation

## 2017-08-31 MED ORDER — TRAMADOL HCL 50 MG PO TABS: 50.0000 mg | ORAL_TABLET | Freq: Four times a day (QID) | ORAL | 0 refills | Status: DC | PRN

## 2017-08-31 MED ORDER — ACETAMINOPHEN 500 MG PO TABS
500.0000 mg | ORAL_TABLET | Freq: Once | ORAL | Status: AC
  Administered 2017-08-31: 500 mg via ORAL
  Filled 2017-08-31: qty 1

## 2017-08-31 MED ORDER — TRAMADOL HCL 50 MG PO TABS
50.0000 mg | ORAL_TABLET | Freq: Once | ORAL | Status: AC
  Administered 2017-08-31: 50 mg via ORAL
  Filled 2017-08-31: qty 1

## 2017-08-31 NOTE — ED Notes (Signed)
I helped the patient walk to the restroom. She was very unsteady and I had to placed hands under both armpits to help brace her while walking.

## 2017-08-31 NOTE — ED Provider Notes (Signed)
[facility] Emergency Department Provider Note  Time seen: 12:53 PM  I have reviewed the triage vital signs and the nursing notes.   HISTORY  Chief Complaint Hip Pain    HPI Paula Thomas is a 83 y.o. female with a past medical history of hypertension, atrial fibrillation, hip fracture approximately 2-3 months ago status post hip replacement who presents to the emergency department for right hip pain.  According to the triage note the patient is from [facility] nursing facility complaining of right hip pain.  No known trauma.  The niece is here with the patient who states she has been complaining of right hip pain ever since her surgery 2-3 months ago, unclear if the pain is any worse or discontinued.  Currently the patient states moderate pain in the right hip, worse with attempted movement or bearing weight.  Patient is able to bear weight and walk but it just hurts per patient.  Patient's review of systems is negative.  Although possibly limited by mild dementia.   Past Medical History:  Diagnosis Date  . Atrial fibrillation (HCC)   . Fatigue   . Hypertension   . Syncope   . Underweight     Patient Active Problem List   Diagnosis Date Noted  . Hip fracture (HCC) 06/02/2017  . Syncope and collapse 04/27/2017  . Advance care planning 02/07/2017  . Anemia 02/07/2017  . Hypocalcemia 11/15/2016  . Cognitive impairment 06/11/2016  . Wart 05/28/2016  . Sciatica 04/14/2015  . Weakness generalized 04/14/2015  . Risk for falls 04/14/2015  . Atrial fibrillation (HCC) 03/07/2015  . Syncope 03/07/2015  . Underweight 03/07/2015  . Hypertension 03/07/2015  . Osteoarthritis of left knee 03/07/2015  . Benign hypertension with CKD (chronic kidney disease) stage I 03/07/2015  . CKD (chronic kidney disease), stage III (HCC) 03/07/2015    Past Surgical History:  Procedure Laterality Date  . ABDOMINAL HYSTERECTOMY    . EYE SURGERY Bilateral 2012   cataracts   . HERNIA REPAIR    . HIP ARTHROPLASTY Right 06/04/2017   Procedure: ARTHROPLASTY BIPOLAR HIP (HEMIARTHROPLASTY);  Surgeon: Signa Kell, MD;  Location: [facility] ORS;  Service: Orthopedics;  Laterality: Right;  . SPLENECTOMY    . TONSILLECTOMY      Prior to Admission medications   Medication Sig Start Date End Date Taking? Authorizing Provider  acetaminophen (TYLENOL 8 HOUR ARTHRITIS PAIN) 650 MG CR tablet Take 650 mg by mouth every 8 (eight) hours as needed for pain.    [provider]  apixaban (ELIQUIS) 2.5 MG TABS tablet Take 2.5 mg by mouth 2 (two) times daily. 03/07/16   [provider]  Cholecalciferol (VITAMIN D3 PO) Take 50 mcg by mouth daily.    [provider]  diltiazem (CARDIZEM CD) 240 MG 24 hr capsule Take 240 mg by mouth daily. 03/07/16   [provider]  hydrochlorothiazide (MICROZIDE) 12.5 MG capsule Take 12.5 mg by mouth daily.    [provider]  losartan (COZAAR) 50 MG tablet Take 1 tablet (50 mg total) by mouth daily. 05/08/17   Gabriel Cirri, NP  memantine (NAMENDA) 5 MG tablet Take 5 mg by mouth daily. 01/06/17 01/06/18  [provider]  Multiple Vitamins-Minerals (HM MULTIVITAMIN ADULT GUMMY PO) Take 1 tablet by mouth daily.     [provider]  oxyCODONE (OXY IR/ROXICODONE) 5 MG immediate release tablet Take 1 tablet (5 mg total) by mouth every 4 (four) hours as needed for moderate pain, severe  pain or breakthrough pain. 06/06/17   Dedra SkeensMundy, Todd, PA-C    Allergies  Allergen Reactions  . Lisinopril Cough  . Penicillin G Potassium [Penicillin G]     Has patient had a PCN reaction causing immediate rash, facial/tongue/throat swelling, SOB or lightheadedness with hypotension: Unknown Has patient had a PCN reaction causing severe rash involving mucus membranes or skin necrosis: Unknown Has patient had a PCN reaction that required hospitalization: Unknown Has patient had a PCN reaction occurring within the last 10 years:  Unknown If all of the above answers are "NO", then may proceed with Cephalosporin use.   . Sulfa Antibiotics     Family History  Problem Relation Age of Onset  . Cancer Mother   . Cancer Father   . Heart attack Brother   . Heart attack Brother     Social History Social History   Tobacco Use  . Smoking status: Former Games developermoker  . Smokeless tobacco: Never Used  Substance Use Topics  . Alcohol use: No    Alcohol/week: 0.0 oz  . Drug use: No    Review of Systems Constitutional: Negative for fever. Eyes: Negative for visual changes. ENT: Negative for congestion Cardiovascular: Negative for chest pain. Respiratory: Negative for shortness of breath. Gastrointestinal: Negative for abdominal pain Genitourinary: Negative for dysuria. Musculoskeletal: Right hip pain Skin: Negative for rash. Neurological: Negative for headache All other ROS negative  ____________________________________________   PHYSICAL EXAM:  VITAL SIGNS: ED Triage Vitals  Enc Vitals Group     BP 08/31/17 1228 (!) 147/73     Pulse Rate 08/31/17 1228 77     Resp 08/31/17 1228 18     Temp 08/31/17 1228 98.2 F (36.8 C)     Temp Source 08/31/17 1228 Oral     SpO2 08/31/17 1228 97 %     Weight 08/31/17 1229 110 lb (49.9 kg)     Height 08/31/17 1229 5\' 5"  (1.651 m)     Head Circumference --      Peak Flow --      Pain Score 08/31/17 1227 5     Pain Loc --      Pain Edu? --      Excl. in GC? --     Constitutional: Alert. Well appearing and in no distress.  Pleasant, cooperative. Eyes: Normal exam ENT   Head: Normocephalic and atraumatic.   Mouth/Throat: Mucous membranes are moist. Cardiovascular: Irregular rhythm, rate around 80 bpm.   Respiratory: Normal respiratory effort without tachypnea nor retractions. Breath sounds are clear Gastrointestinal: Soft and nontender. No distention.   Musculoskeletal: Mild right hip tenderness to palpation, good range of motion but she does state mild pain  with range of motion.  Neurovascular intact distally. Neurologic:  Normal speech and language. No gross focal neurologic deficits  Skin:  Skin is warm, dry and intact.  Psychiatric: Mood and affect are normal.   ____________________________________________     RADIOLOGY  X-ray negative for acute abnormality  ____________________________________________   INITIAL IMPRESSION / ASSESSMENT AND PLAN / ED COURSE  Pertinent labs & imaging results that were available during my care of the patient were reviewed by me and considered in my medical decision making (see chart for details).  Patient presents emergency department for right hip pain.  Differential would include fracture, contusion, dislocation, postoperative pain.  Will obtain an x-ray to further evaluate.  I reviewed the patient's records she was admitted and had a hip surgery performed 06/04/17 by Dr. Allena KatzPatel.  We  will dose tramadol in the emergency department for pain relief while awaiting x-ray results.  X-ray negative for acute abnormality.  We will discharge home with Tylenol to be used for pain, and a short course of tramadol if needed for significant discomfort.  Patient will follow up with orthopedics.  ____________________________________________   FINAL CLINICAL IMPRESSION(S) / ED DIAGNOSES  Postoperative pain Right hip pain    Minna AntisPaduchowski, Harjit Leider, MD 08/31/17 1414

## 2017-08-31 NOTE — ED Notes (Signed)
Patient was given a couple of blankets for warmth.

## 2017-08-31 NOTE — ED Triage Notes (Signed)
[facility] Pt is complaining of Right hip pain,she has some Dementia. She can not recall falling or any trauma. No deformity seen. Pulses Present, she does have pedal swelling to right foot.

## 2017-08-31 NOTE — ED Notes (Signed)
P twas able to bear weight with slow steady gait to get into wheelchair. Family was present during discharge instructions. No questions or concerns voiced.

## 2017-09-05 ENCOUNTER — Encounter: Payer: Medicare Other | Admitting: Unknown Physician Specialty

## 2017-10-23 ENCOUNTER — Emergency Department
Admission: EM | Admit: 2017-10-23 | Discharge: 2017-10-23 | Disposition: A | Discharge status: Skilled Nursing Facility | Payer: Medicare Other | Attending: Emergency Medicine | Admitting: Emergency Medicine

## 2017-10-23 ENCOUNTER — Emergency Department: Payer: Medicare Other

## 2017-10-23 ENCOUNTER — Encounter: Payer: Self-pay | Admitting: Emergency Medicine

## 2017-10-23 ENCOUNTER — Other Ambulatory Visit: Payer: Self-pay

## 2017-10-23 DIAGNOSIS — S22080A Wedge compression fracture of T11-T12 vertebra, initial encounter for closed fracture: Secondary | ICD-10-CM

## 2017-10-23 DIAGNOSIS — Y999 Unspecified external cause status: Secondary | ICD-10-CM | POA: Diagnosis not present

## 2017-10-23 DIAGNOSIS — Z79899 Other long term (current) drug therapy: Secondary | ICD-10-CM | POA: Diagnosis not present

## 2017-10-23 DIAGNOSIS — Z87891 Personal history of nicotine dependence: Secondary | ICD-10-CM | POA: Diagnosis not present

## 2017-10-23 DIAGNOSIS — M25551 Pain in right hip: Secondary | ICD-10-CM

## 2017-10-23 DIAGNOSIS — Y929 Unspecified place or not applicable: Secondary | ICD-10-CM | POA: Insufficient documentation

## 2017-10-23 DIAGNOSIS — X58XXXA Exposure to other specified factors, initial encounter: Secondary | ICD-10-CM | POA: Insufficient documentation

## 2017-10-23 DIAGNOSIS — Y939 Activity, unspecified: Secondary | ICD-10-CM | POA: Diagnosis not present

## 2017-10-23 DIAGNOSIS — F0390 Unspecified dementia without behavioral disturbance: Secondary | ICD-10-CM | POA: Insufficient documentation

## 2017-10-23 LAB — CBC WITH DIFFERENTIAL/PLATELET
BASOS PCT: 1 %
Basophils Absolute: 0.1 10*3/uL (ref 0–0.1)
EOS ABS: 0.1 10*3/uL (ref 0–0.7)
Eosinophils Relative: 1 %
HCT: 38.2 % (ref 35.0–47.0)
HEMOGLOBIN: 12.5 g/dL (ref 12.0–16.0)
Lymphocytes Relative: 11 %
Lymphs Abs: 1.3 10*3/uL (ref 1.0–3.6)
MCH: 28.8 pg (ref 26.0–34.0)
MCHC: 32.6 g/dL (ref 32.0–36.0)
MCV: 88.4 fL (ref 80.0–100.0)
Monocytes Absolute: 0.7 10*3/uL (ref 0.2–0.9)
Monocytes Relative: 6 %
NEUTROS PCT: 81 %
Neutro Abs: 9.7 10*3/uL — ABNORMAL HIGH (ref 1.4–6.5)
Platelets: 597 10*3/uL — ABNORMAL HIGH (ref 150–440)
RBC: 4.32 MIL/uL (ref 3.80–5.20)
RDW: 14.6 % — ABNORMAL HIGH (ref 11.5–14.5)
WBC: 11.8 10*3/uL — AB (ref 3.6–11.0)

## 2017-10-23 LAB — URINALYSIS, COMPLETE (UACMP) WITH MICROSCOPIC
BACTERIA UA: NONE SEEN
BILIRUBIN URINE: NEGATIVE
Glucose, UA: NEGATIVE mg/dL
Hgb urine dipstick: NEGATIVE
KETONES UR: 5 mg/dL — AB
LEUKOCYTES UA: NEGATIVE
Nitrite: NEGATIVE
Protein, ur: NEGATIVE mg/dL
SPECIFIC GRAVITY, URINE: 1.006 (ref 1.005–1.030)
pH: 7 (ref 5.0–8.0)

## 2017-10-23 LAB — BASIC METABOLIC PANEL
ANION GAP: 11 (ref 5–15)
BUN: 14 mg/dL (ref 6–20)
CALCIUM: 9.2 mg/dL (ref 8.9–10.3)
CO2: 27 mmol/L (ref 22–32)
Chloride: 101 mmol/L (ref 101–111)
Creatinine, Ser: 0.79 mg/dL (ref 0.44–1.00)
GFR calc Af Amer: >60 mL/min (ref >60)
GFR calc non Af Amer: >60 mL/min (ref >60)
GLUCOSE: 106 mg/dL — AB (ref 65–99)
Potassium: 3.4 mmol/L — ABNORMAL LOW (ref 3.5–5.1)
Sodium: 139 mmol/L (ref 135–145)

## 2017-10-23 MED ORDER — HYDROCODONE-ACETAMINOPHEN 5-325 MG PO TABS
1.0000 | ORAL_TABLET | Freq: Once | ORAL | Status: AC
  Administered 2017-10-23: 1 via ORAL
  Filled 2017-10-23: qty 1

## 2017-10-23 MED ORDER — IBUPROFEN 400 MG PO TABS
400.0000 mg | ORAL_TABLET | Freq: Once | ORAL | Status: AC
  Administered 2017-10-23: 400 mg via ORAL
  Filled 2017-10-23: qty 1

## 2017-10-23 NOTE — ED Triage Notes (Signed)
Pt [facility] c/o R hip pain. Hx hip replacement on R side. States pain is chronic but worsened yesterday. Pt denies any new injury. Saw PCP yesterday who took x-rays (pt reports no results yet) and gave Rx for Tramadol, Flexeril, and Oxycodone that pt states she hasn't gotten filled yet.

## 2017-10-23 NOTE — ED Notes (Signed)
[facility]  [phone]

## 2017-10-23 NOTE — Discharge Instructions (Signed)
Return to the emergency department immediately for any worsening condition including redness, fever, worsening pain, or any other symptoms concerning to you.

## 2017-10-23 NOTE — ED Notes (Signed)
Spoke to resident care coordinator at [facility] who states pt has some dementia/memory loss but is typically able to answer questions appropriately. States pt's new medications were filled yesterday and pt took flexeril and tramadol yesterday but nothing today yet.

## 2017-10-23 NOTE — ED Provider Notes (Signed)
[facility] Emergency Department Provider Note ____________________________________________   I have reviewed the triage vital signs and the triage nursing note.  HISTORY  Chief Complaint Hip Pain   Historian Level 5 Caveat History Limited by poor historian  HPI Paula Thomas is a 84 y.o. female with chronic right hip pain, and diagnosed cognitive impairment on her active problem list, her nursing home she is at assisted living and she does have some dementia issues, it is a little unclear why she was sent to the ED because dayshift states that night shift as who called for her to be taken in and they are gone now.  Best as we can tell from EMS they sent her in because of the chronic right hip pain.  We were also given history that she was just recently placed on anti-inflammatory, narcotic, and Flexeril for this chronic hip pain.  Patient herself was not really complaining of hip pain although she does have some hip pain on the right when the hip is moved.  Patient herself is somewhat confused and cannot tell me why she was sent here.   Past Medical History:  Diagnosis Date  . Atrial fibrillation (HCC)   . Fatigue   . Hypertension   . Syncope   . Underweight     Patient Active Problem List   Diagnosis Date Noted  . Hip fracture (HCC) 06/02/2017  . Syncope and collapse 04/27/2017  . Advance care planning 02/07/2017  . Anemia 02/07/2017  . Hypocalcemia 11/15/2016  . Cognitive impairment 06/11/2016  . Wart 05/28/2016  . Sciatica 04/14/2015  . Weakness generalized 04/14/2015  . Risk for falls 04/14/2015  . Atrial fibrillation (HCC) 03/07/2015  . Syncope 03/07/2015  . Underweight 03/07/2015  . Hypertension 03/07/2015  . Osteoarthritis of left knee 03/07/2015  . Benign hypertension with CKD (chronic kidney disease) stage I 03/07/2015  . CKD (chronic kidney disease), stage III (HCC) 03/07/2015    Past Surgical History:  Procedure  Laterality Date  . ABDOMINAL HYSTERECTOMY    . EYE SURGERY Bilateral 2012   cataracts  . HERNIA REPAIR    . HIP ARTHROPLASTY Right 06/04/2017   Procedure: ARTHROPLASTY BIPOLAR HIP (HEMIARTHROPLASTY);  Surgeon: Signa KellPatel, Sunny, MD;  Location: [facility] ORS;  Service: Orthopedics;  Laterality: Right;  . SPLENECTOMY    . TONSILLECTOMY      Prior to Admission medications   Medication Sig Start Date End Date Taking? Authorizing Provider  acetaminophen (TYLENOL) 500 MG tablet Take 1,000 mg by mouth 3 (three) times daily.   Yes [provider]  apixaban (ELIQUIS) 2.5 MG TABS tablet Take 2.5 mg by mouth 2 (two) times daily. 03/07/16  Yes [provider]  cyclobenzaprine (FLEXERIL) 10 MG tablet Take 10 mg by mouth 3 (three) times daily as needed for muscle spasms. 10/20/17 11/02/17 Yes [provider]  diltiazem (CARDIZEM CD) 240 MG 24 hr capsule Take 240 mg by mouth daily. 03/07/16  Yes [provider]  hydrochlorothiazide (MICROZIDE) 12.5 MG capsule Take 12.5 mg by mouth daily.   Yes [provider]  losartan (COZAAR) 50 MG tablet Take 1 tablet (50 mg total) by mouth daily. 05/08/17  Yes Gabriel CirriWicker, Cheryl, NP  meloxicam (MOBIC) 15 MG tablet Take 15 mg by mouth daily. 10/20/17  Yes [provider]  memantine (NAMENDA) 5 MG tablet Take 5 mg by mouth daily. 01/06/17 01/06/18 Yes [provider]  Multiple Vitamin (THEREMS) TABS Take 1 tablet by mouth daily.   Yes [provider]  oxyCODONE (OXY IR/ROXICODONE) 5 MG immediate release tablet Take 1 tablet (5 mg total) by mouth every 4 (four) hours as needed for moderate pain, severe pain or breakthrough pain. Patient taking differently: Take 5 mg by mouth 3 (three) times daily as needed for moderate pain, severe pain or breakthrough pain.  06/06/17  Yes Dedra Skeens, PA-C  traMADol (ULTRAM) 50 MG tablet Take 1 tablet (50 mg total) by mouth every 6 (six) hours as needed. Patient taking differently: Take 50 mg by  mouth 2 (two) times daily.  08/31/17  Yes Minna Antis, MD    Allergies  Allergen Reactions  . Lisinopril Cough  . Penicillin G Potassium [Penicillin G]     Has patient had a PCN reaction causing immediate rash, facial/tongue/throat swelling, SOB or lightheadedness with hypotension: Unknown Has patient had a PCN reaction causing severe rash involving mucus membranes or skin necrosis: Unknown Has patient had a PCN reaction that required hospitalization: Unknown Has patient had a PCN reaction occurring within the last 10 years: Unknown If all of the above answers are "NO", then may proceed with Cephalosporin use.   . Sulfa Antibiotics     unknown    Family History  Problem Relation Age of Onset  . Cancer Mother   . Cancer Father   . Heart attack Brother   . Heart attack Brother     Social History Social History   Tobacco Use  . Smoking status: Former Games developer  . Smokeless tobacco: Never Used  Substance Use Topics  . Alcohol use: No    Alcohol/week: 0.0 oz  . Drug use: No    Review of Systems  Level 5 caveat unable to obtain due to poor historian  She denies chest pain or trouble breathing, abdominal pain, any particular weakness or numbness.  She is reporting the chronic right hip pain.  ____________________________________________   PHYSICAL EXAM:  VITAL SIGNS: ED Triage Vitals  Enc Vitals Group     BP 10/23/17 0831 (!) 165/87     Pulse Rate 10/23/17 0831 94     Resp 10/23/17 0831 18     Temp 10/23/17 0831 98 F (36.7 C)     Temp Source 10/23/17 0831 Oral     SpO2 10/23/17 0831 97 %     Weight 10/23/17 0828 115 lb (52.2 kg)     Height 10/23/17 0828 5\' 4"  (1.626 m)     Head Circumference --      Peak Flow --      Pain Score 10/23/17 0830 8     Pain Loc --      Pain Edu? --      Excl. in GC? --      Constitutional: Alert cooperative, disoriented to time.  Poor historian.  Well appearing and in no distress. HEENT   Head: Normocephalic and  atraumatic.      Eyes: Conjunctivae are normal. Pupils equal and round.       Ears:         Nose: No congestion/rhinnorhea.   Mouth/Throat: Mucous membranes are moist.   Neck: No stridor. Cardiovascular/Chest: Normal rate, regular rhythm.  No murmurs, rubs, or gallops. Respiratory: Normal respiratory effort without tachypnea nor retractions. Breath sounds are clear and equal bilaterally. No wheezes/rales/rhonchi. Gastrointestinal: Soft. No distention, no guarding, no rebound. Nontender.    Genitourinary/rectal:Deferred Musculoskeletal: Stable.  Some pain into the right hip when she ranges her right hip.  There is no overlying skin redness or joint  swelling.     No joint effusions.  No lower extremity tenderness.  No edema. Neurologic:  Normal speech and language. No gross or focal neurologic deficits are appreciated. Skin:  Skin is warm, dry and intact. No rash noted. Psychiatric: Poor historian but no agitation.   ____________________________________________  LABS (pertinent positives/negatives) I, Governor Rooks, MD the attending physician have reviewed the labs noted below.  Labs Reviewed  BASIC METABOLIC PANEL - Abnormal; Notable for the following components:      Result Value   Potassium 3.4 (*)    Glucose, Bld 106 (*)    All other components within normal limits  URINALYSIS, COMPLETE (UACMP) WITH MICROSCOPIC - Abnormal; Notable for the following components:   Color, Urine STRAW (*)    APPearance CLEAR (*)    Ketones, ur 5 (*)    Squamous Epithelial / LPF 0-5 (*)    All other components within normal limits  CBC WITH DIFFERENTIAL/PLATELET - Abnormal; Notable for the following components:   WBC 11.8 (*)    RDW 14.6 (*)    Platelets 597 (*)    Neutro Abs 9.7 (*)    All other components within normal limits     ____________________________________________  RADIOLOGY   Viewed and interpreted by me, x-ray right hip: No visualized fracture, or  dislocation  Radiologist report: IMPRESSION: No acute fracture. No change in appearance of right hip hemiarthroplasty. AVN of the left femoral head.  Lumbar spine x-ray reviewed by me: T12 compression fracture Radiologist report: IMPRESSION: T12 compression deformity which is new from January of 2018 but likely of a chronic nature __________________________________________  PROCEDURES  Procedure(s) performed: None  Critical Care performed: None   ____________________________________________  ED COURSE / ASSESSMENT AND PLAN  Pertinent labs & imaging results that were available during my care of the patient were reviewed by me and considered in my medical decision making (see chart for details).   Is unfortunately a little bit unclear to me why the patient was sent over here.  Nursing home staff was really unable to give a full report because apparently the night shift is who called for patient to be sent in.  This is like and tell it was because of complaining of pain at the right hip where she has chronic pain and was very recently evaluated and started on new medications within the last 2 days for chronic hip pain including Flexeril, oxycodone, and anti-inflammatory.  Apparently she did not have anything this morning.  Here she is confused and cannot add anything additional to her history.  I will check an x-ray and some screening laboratory studies including urinalysis, and give her pain medications for the right hip.  I do not have a high suspicion for a new fracture clinically.  X-rays without acute findings, there is a newly visualized T12 compression fracture, but looks chronic according to radiologist report.       Patient / Family / Caregiver informed of clinical course, medical decision-making process, and agree with plan.   I discussed return precautions, follow-up instructions, and discharge instructions with patient and/or family.  Discharge Instructions :  Return to the emergency department immediately for any worsening condition including redness, fever, worsening pain, or any other symptoms concerning to you.    ___________________________________________   FINAL CLINICAL IMPRESSION(S) / ED DIAGNOSES   Final diagnoses:  Right hip pain  T12 compression fracture (HCC)      ___________________________________________        Note: This dictation was  prepared with [employer]. Any transcriptional errors that result from this process are unintentional    Governor Rooks, MD 10/23/17 1126

## 2017-10-23 NOTE — ED Notes (Signed)
Patient's discharge and follow up information reviewed with patient by ED nursing staff and patient given the opportunity to ask questions pertaining to ED visit and discharge plan of care. Patient advised that should symptoms not continue to improve, resolve entirely, or should new symptoms develop then a follow up visit with their PCP or a return visit to the ED may be warranted. Patient verbalized consent and understanding of discharge plan of care including potential need for further evaluation. Patient discharged in stable condition per attending ED physician on duty.   Pt's son present, states he is able and willing to transport [facility]. [facility] to notify of pt discharge and return by POV with family.

## 2017-10-23 NOTE — ED Notes (Signed)
Cancelled [facility]  Per family providing transportation

## 2017-10-24 ENCOUNTER — Encounter: Payer: Self-pay | Admitting: Unknown Physician Specialty

## 2017-12-02 ENCOUNTER — Emergency Department: Payer: Medicare Other

## 2017-12-02 ENCOUNTER — Other Ambulatory Visit: Payer: Self-pay

## 2017-12-02 ENCOUNTER — Emergency Department
Admission: EM | Admit: 2017-12-02 | Discharge: 2017-12-03 | Disposition: A | Discharge status: Skilled Nursing Facility | Payer: Medicare Other | Attending: Emergency Medicine | Admitting: Emergency Medicine

## 2017-12-02 ENCOUNTER — Encounter: Payer: Self-pay | Admitting: Emergency Medicine

## 2017-12-02 DIAGNOSIS — Z7901 Long term (current) use of anticoagulants: Secondary | ICD-10-CM | POA: Diagnosis not present

## 2017-12-02 DIAGNOSIS — S0990XA Unspecified injury of head, initial encounter: Secondary | ICD-10-CM | POA: Diagnosis present

## 2017-12-02 DIAGNOSIS — Y999 Unspecified external cause status: Secondary | ICD-10-CM | POA: Insufficient documentation

## 2017-12-02 DIAGNOSIS — Z87891 Personal history of nicotine dependence: Secondary | ICD-10-CM | POA: Diagnosis not present

## 2017-12-02 DIAGNOSIS — Y929 Unspecified place or not applicable: Secondary | ICD-10-CM | POA: Diagnosis not present

## 2017-12-02 DIAGNOSIS — Y939 Activity, unspecified: Secondary | ICD-10-CM | POA: Insufficient documentation

## 2017-12-02 DIAGNOSIS — S7001XA Contusion of right hip, initial encounter: Secondary | ICD-10-CM | POA: Diagnosis not present

## 2017-12-02 DIAGNOSIS — Z79899 Other long term (current) drug therapy: Secondary | ICD-10-CM | POA: Diagnosis not present

## 2017-12-02 DIAGNOSIS — I10 Essential (primary) hypertension: Secondary | ICD-10-CM | POA: Diagnosis not present

## 2017-12-02 DIAGNOSIS — W19XXXA Unspecified fall, initial encounter: Secondary | ICD-10-CM
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
## 2017-12-03 ENCOUNTER — Emergency Department: Payer: Medicare Other

## 2017-12-03 ENCOUNTER — Other Ambulatory Visit: Payer: Medicare Other

## 2017-12-07 ENCOUNTER — Emergency Department: Payer: Medicare Other

## 2017-12-07 ENCOUNTER — Encounter: Payer: Self-pay | Admitting: Emergency Medicine

## 2017-12-07 ENCOUNTER — Other Ambulatory Visit: Payer: Self-pay

## 2017-12-07 ENCOUNTER — Emergency Department
Admission: EM | Admit: 2017-12-07 | Discharge: 2017-12-07 | Disposition: A | Discharge status: Skilled Nursing Facility | Payer: Medicare Other | Source: Home / Self Care | Attending: Emergency Medicine | Admitting: Emergency Medicine

## 2017-12-07 ENCOUNTER — Emergency Department
Admission: EM | Admit: 2017-12-07 | Discharge: 2017-12-07 | Disposition: A | Discharge status: Home / Self Care | Payer: Medicare Other | Attending: Emergency Medicine | Admitting: Emergency Medicine

## 2017-12-07 DIAGNOSIS — Y929 Unspecified place or not applicable: Secondary | ICD-10-CM

## 2017-12-07 DIAGNOSIS — Y999 Unspecified external cause status: Secondary | ICD-10-CM

## 2017-12-07 DIAGNOSIS — Z87891 Personal history of nicotine dependence: Secondary | ICD-10-CM

## 2017-12-07 DIAGNOSIS — S02402A Zygomatic fracture, unspecified, initial encounter for closed fracture: Secondary | ICD-10-CM

## 2017-12-07 DIAGNOSIS — M25552 Pain in left hip: Secondary | ICD-10-CM | POA: Diagnosis present

## 2017-12-07 DIAGNOSIS — S40212A Abrasion of left shoulder, initial encounter: Secondary | ICD-10-CM

## 2017-12-07 DIAGNOSIS — N183 Chronic kidney disease, stage 3 (moderate): Secondary | ICD-10-CM | POA: Insufficient documentation

## 2017-12-07 DIAGNOSIS — S0990XA Unspecified injury of head, initial encounter: Secondary | ICD-10-CM

## 2017-12-07 DIAGNOSIS — F0390 Unspecified dementia without behavioral disturbance: Secondary | ICD-10-CM | POA: Diagnosis not present

## 2017-12-07 DIAGNOSIS — M25551 Pain in right hip: Secondary | ICD-10-CM | POA: Insufficient documentation

## 2017-12-07 DIAGNOSIS — Z79899 Other long term (current) drug therapy: Secondary | ICD-10-CM | POA: Insufficient documentation

## 2017-12-07 DIAGNOSIS — I129 Hypertensive chronic kidney disease with stage 1 through stage 4 chronic kidney disease, or unspecified chronic kidney disease: Secondary | ICD-10-CM | POA: Insufficient documentation

## 2017-12-07 DIAGNOSIS — S0230XA Fracture of orbital floor, unspecified side, initial encounter for closed fracture: Secondary | ICD-10-CM

## 2017-12-07 DIAGNOSIS — S02401A Maxillary fracture, unspecified, initial encounter for closed fracture: Secondary | ICD-10-CM

## 2017-12-07 DIAGNOSIS — W19XXXA Unspecified fall, initial encounter: Secondary | ICD-10-CM

## 2017-12-07 DIAGNOSIS — Z96641 Presence of right artificial hip joint: Secondary | ICD-10-CM | POA: Insufficient documentation

## 2017-12-07 DIAGNOSIS — Y939 Activity, unspecified: Secondary | ICD-10-CM

## 2017-12-07 DIAGNOSIS — S60812A Abrasion of left wrist, initial encounter: Secondary | ICD-10-CM

## 2017-12-07 DIAGNOSIS — S80212A Abrasion, left knee, initial encounter: Secondary | ICD-10-CM

## 2017-12-07 DIAGNOSIS — S81812A Laceration without foreign body, left lower leg, initial encounter: Secondary | ICD-10-CM

## 2017-12-07 DIAGNOSIS — R52 Pain, unspecified: Secondary | ICD-10-CM

## 2017-12-07 DIAGNOSIS — S0280XA Fracture of other specified skull and facial bones, unspecified side, initial encounter for closed fracture: Secondary | ICD-10-CM

## 2017-12-07 MED ORDER — TETRACAINE HCL 0.5 % OP SOLN
OPHTHALMIC | Status: AC
  Administered 2017-12-07: 2 [drp] via OPHTHALMIC
  Filled 2017-12-07: qty 4

## 2017-12-07 MED ORDER — TETRACAINE HCL 0.5 % OP SOLN
2.0000 [drp] | Freq: Once | OPHTHALMIC | Status: AC
  Administered 2017-12-07: 2 [drp] via OPHTHALMIC

## 2017-12-07 MED ORDER — TETANUS-DIPHTH-ACELL PERTUSSIS 5-2.5-18.5 LF-MCG/0.5 IM SUSP
0.5000 mL | Freq: Once | INTRAMUSCULAR | Status: AC
  Administered 2017-12-07: 0.5 mL via INTRAMUSCULAR
  Filled 2017-12-07: qty 0.5

## 2017-12-07 MED ORDER — OXYCODONE HCL 5 MG PO TABS: 2.5000 mg | ORAL_TABLET | Freq: Four times a day (QID) | ORAL | 0 refills | Status: DC | PRN

## 2017-12-07 MED ORDER — OXYCODONE HCL 5 MG PO TABS
5.0000 mg | ORAL_TABLET | Freq: Once | ORAL | Status: AC
  Administered 2017-12-07: 5 mg via ORAL
  Filled 2017-12-07: qty 1

## 2017-12-07 MED ORDER — ACETAMINOPHEN 500 MG PO TABS
1000.0000 mg | ORAL_TABLET | Freq: Once | ORAL | Status: AC
  Administered 2017-12-07: 1000 mg via ORAL
  Filled 2017-12-07: qty 2

## 2017-12-07 NOTE — ED Notes (Signed)
Pt cleaned up and repositioned in bed. 

## 2017-12-07 NOTE — ED Notes (Signed)
Skin tear on left knee cleaned up by RN Gearldine BienenstockBrandy

## 2017-12-07 NOTE — ED Notes (Signed)
Pt unable to sign discharge. [facility] called and report given

## 2017-12-07 NOTE — ED Notes (Signed)
Report called to [place] at [facility]. Informed him of the appt needing to be made for patient and to keep close eye on patient.

## 2017-12-07 NOTE — ED Notes (Addendum)
Left eye -20/30 Right eye -20/30 Both-20/30

## 2017-12-07 NOTE — ED Triage Notes (Signed)
Pt coming from [facility] via EMS c/o fall, unsure if she hit her head.  Pt is on blood thinners.  H/o dementia, poor historian. EMS stated that they were having a hard time getting history on her at the home.

## 2017-12-07 NOTE — Discharge Instructions (Signed)
Patient needs to be seen at [facility] tomorrow for eye exam. She needs to be [facility] by [facility], Dr. Myna HidalgoZdanski in 2-3 days for her facial fractures.  Pain control: Take tylenol 1000mg  every 8 hours. Take 2.5mg  of oxycodone every 6 hours for breakthrough pain. If you need the oxycodone make sure to take one senokot as well to prevent constipation.  Please make sure patient is monitored to avoid falls.  She needs to return to the ER for severe headache, eye pain, or any changes in vision.

## 2017-12-07 NOTE — ED Notes (Signed)
Patient transported to CT 

## 2017-12-07 NOTE — ED Notes (Signed)
[facility] to [facility].  Phone line busy

## 2017-12-07 NOTE — ED Triage Notes (Signed)
[facility] after an unwitnessed fall. Pt was just d/c about 30 minutes ago for same complaint. Pt does have hx of dementia. VSs-BP 133/97, 98%, 108-HR. Pt has noted skin tear to left knee, abrasion on left upper arm. Pt also has redness noted to left side of face. Pt is alert

## 2017-12-07 NOTE — ED Provider Notes (Signed)
[facility] Emergency Department Provider Note  ____________________________________________   I have reviewed the triage vital signs and the nursing notes. Where available I have reviewed prior notes and, if possible and indicated, outside hospital notes.    HISTORY  Chief Complaint Fall    HPI Paula Thomas is a 84 y.o. female with a history of dementia, on Eliquis for atrial fibrillation, with a history of frequent falls, was found sitting down the ground next to her roller scooter.  Unclear if she fell or she sat on the ground.  Very poor historian.  At her baseline in all respects.  Has no complaints of pain when asked her initially.  Level 5 chart caveat; no further history available due to patient status.  EMS reports that there was no noticeable trauma and no evidence that she had fallen she was simply sitting on the ground and they called 911 as a precaution.  When they palpated her she states she had some pain to her left hip.  She does not endorse that for me at this time     Past Medical History:  Diagnosis Date  . Atrial fibrillation (HCC)   . Fatigue   . Hypertension   . Syncope   . Underweight     Patient Active Problem List   Diagnosis Date Noted  . Hip fracture (HCC) 06/02/2017  . Syncope and collapse 04/27/2017  . Advance care planning 02/07/2017  . Anemia 02/07/2017  . Hypocalcemia 11/15/2016  . Cognitive impairment 06/11/2016  . Wart 05/28/2016  . Sciatica 04/14/2015  . Weakness generalized 04/14/2015  . Risk for falls 04/14/2015  . Atrial fibrillation (HCC) 03/07/2015  . Syncope 03/07/2015  . Underweight 03/07/2015  . Hypertension 03/07/2015  . Osteoarthritis of left knee 03/07/2015  . Benign hypertension with CKD (chronic kidney disease) stage I 03/07/2015  . CKD (chronic kidney disease), stage III (HCC) 03/07/2015    Past Surgical History:  Procedure Laterality Date  . ABDOMINAL HYSTERECTOMY    . EYE SURGERY  Bilateral 2012   cataracts  . HERNIA REPAIR    . HIP ARTHROPLASTY Right 06/04/2017   Procedure: ARTHROPLASTY BIPOLAR HIP (HEMIARTHROPLASTY);  Surgeon: Signa KellPatel, Sunny, MD;  Location: [facility] ORS;  Service: Orthopedics;  Laterality: Right;  . SPLENECTOMY    . TONSILLECTOMY      Prior to Admission medications   Medication Sig Start Date End Date Taking? Authorizing Provider  acetaminophen (TYLENOL) 500 MG tablet Take 1,000 mg by mouth 3 (three) times daily.    [provider]  apixaban (ELIQUIS) 2.5 MG TABS tablet Take 2.5 mg by mouth 2 (two) times daily. 03/07/16   [provider]  diltiazem (CARDIZEM CD) 240 MG 24 hr capsule Take 240 mg by mouth daily. 03/07/16   [provider]  hydrochlorothiazide (MICROZIDE) 12.5 MG capsule Take 12.5 mg by mouth daily.    [provider]  losartan (COZAAR) 50 MG tablet Take 1 tablet (50 mg total) by mouth daily. 05/08/17   Gabriel CirriWicker, Cheryl, NP  meloxicam (MOBIC) 15 MG tablet Take 15 mg by mouth daily. 10/20/17   [provider]  memantine (NAMENDA) 5 MG tablet Take 5 mg by mouth daily. 01/06/17 01/06/18  [provider]  Multiple Vitamin (THEREMS) TABS Take 1 tablet by mouth daily.    [provider]  oxyCODONE (OXY IR/ROXICODONE) 5 MG immediate release tablet Take 1 tablet (5 mg total) by mouth every 4 (four) hours as needed for moderate pain, severe pain or  breakthrough pain. Patient taking differently: Take 5 mg by mouth 3 (three) times daily as needed for moderate pain, severe pain or breakthrough pain.  06/06/17   Dedra Skeens, PA-C  traMADol (ULTRAM) 50 MG tablet Take 1 tablet (50 mg total) by mouth every 6 (six) hours as needed. Patient taking differently: Take 50 mg by mouth 2 (two) times daily.  08/31/17   Minna Antis, MD    Allergies Lisinopril; Penicillin g potassium [penicillin g]; and Sulfa antibiotics  Family History  Problem Relation Age of Onset  . Cancer Mother   . Cancer Father   .  Heart attack Brother   . Heart attack Brother     Social History Social History   Tobacco Use  . Smoking status: Former Games developer  . Smokeless tobacco: Never Used  Substance Use Topics  . Alcohol use: No    Alcohol/week: 0.0 oz  . Drug use: No    Review of Systems Level 5 chart caveat; no further history available due to patient status.  ____________________________________________   PHYSICAL EXAM:  VITAL SIGNS: ED Triage Vitals  Enc Vitals Group     BP 12/07/17 1228 131/71     Pulse Rate 12/07/17 1228 93     Resp 12/07/17 1228 19     Temp 12/07/17 1228 97.8 F (36.6 C)     Temp Source 12/07/17 1228 Oral     SpO2 12/07/17 1228 99 %     Weight 12/07/17 1229 103 lb (46.7 kg)     Height 12/07/17 1229 5\' 4"  (1.626 m)     Head Circumference --      Peak Flow --      Pain Score 12/07/17 1229 0     Pain Loc --      Pain Edu? --      Excl. in GC? --     Constitutional: Alert and oriented to name pleasantly demented no acute distress. Well appearing and in no acute distress. Eyes: Conjunctivae are normal Head: Atraumatic HEENT: No congestion/rhinnorhea. Mucous membranes are moist.  Oropharynx non-erythematous Neck:   Nontender with no meningismus, no masses, no stridor Cardiovascular: Normal rate, regular rhythm. Grossly normal heart sounds.  Good peripheral circulation. Respiratory: Normal respiratory effort.  No retractions. Lungs CTAB. Abdominal: Soft and nontender. No distention. No guarding no rebound Back:  There is no focal tenderness or step off.  there is no midline tenderness there are no lesions noted. there is no CVA tenderness Musculoskeletal: There is some minimal tenderness to palpation with ranging of both hips.  No obvious deformity noted good pulses, no upper extremity tenderness. No joint effusions, no DVT signs strong distal pulses no edema Neurologic:  Normal speech and language. No gross focal neurologic deficits are appreciated.  Skin:  Skin is warm,  dry and intact. No rash noted. Psychiatric: Mood and affect are normal. Speech and behavior are normal.  ____________________________________________   LABS (all labs ordered are listed, but only abnormal results are displayed)  Labs Reviewed - No data to display  Pertinent labs  results that were available during my care of the patient were reviewed by me and considered in my medical decision making (see chart for details). ____________________________________________  EKG  I personally interpreted any EKGs ordered by me or triage Atrial fibrillation, no acute ST elevation or depression, normal axis ____________________________________________  RADIOLOGY  Pertinent labs & imaging results that were available during my care of the patient were reviewed by me and considered in my medical  decision making (see chart for details). If possible, patient and/or family made aware of any abnormal findings.  I personally reviewed x-rays  No results found. ____________________________________________    PROCEDURES  Procedure(s) performed: None  Procedures  Critical Care performed: None  ____________________________________________   INITIAL IMPRESSION / ASSESSMENT AND PLAN / ED COURSE  Pertinent labs & imaging results that were available during my care of the patient were reviewed by me and considered in my medical decision making (see chart for details).  Patient here after an unwitnessed possible fall she has some minimal hip tenderness, will obtain x-rays of the hip and because she is on blood thinners and has no recollection of the event we will do a CT of the head I do not think further workup is indicated at this time.    ____________________________________________   FINAL CLINICAL IMPRESSION(S) / ED DIAGNOSES  Final diagnoses:  Pain      This chart was dictated using voice recognition software.  Despite best efforts to proofread,  errors can occur which can  change meaning.      Jeanmarie Plant, MD 12/07/17 1254

## 2017-12-07 NOTE — ED Provider Notes (Signed)
[facility] Emergency Department Provider Note  ____________________________________________  Time seen: Approximately 5:54 PM  I have reviewed the triage vital signs and the nursing notes.   HISTORY  Chief Complaint Fall  Level 5 caveat:  Portions of the history and physical were unable to be obtained due to dementia   HPI Paula Thomas is a 84 y.o. female with a history of dementia and frequent falls who presents from a fall.  Patient was discharged from our hospital earlier this afternoon after a unwitnessed fall.  She returns again after having another fall today. Patient was sitting on a recliner and then found on the ground calling for help. Fall was unwitnessed but thought to be due to the fact that patient was trying to get up unassisted. Patient is complaining of L sided face pain, left-sided shoulder pain, left-sided wrist pain, left sided knee pain.  She has abrasions in all of these locations.  Patient is on Eliquis. Patient is able to tell me that she is here because she fell but is unable to recall the fall.  Past Medical History:  Diagnosis Date  . Atrial fibrillation (HCC)   . Fatigue   . Hypertension   . Syncope   . Underweight     Patient Active Problem List   Diagnosis Date Noted  . Hip fracture (HCC) 06/02/2017  . Syncope and collapse 04/27/2017  . Advance care planning 02/07/2017  . Anemia 02/07/2017  . Hypocalcemia 11/15/2016  . Cognitive impairment 06/11/2016  . Wart 05/28/2016  . Sciatica 04/14/2015  . Weakness generalized 04/14/2015  . Risk for falls 04/14/2015  . Atrial fibrillation (HCC) 03/07/2015  . Syncope 03/07/2015  . Underweight 03/07/2015  . Hypertension 03/07/2015  . Osteoarthritis of left knee 03/07/2015  . Benign hypertension with CKD (chronic kidney disease) stage I 03/07/2015  . CKD (chronic kidney disease), stage III (HCC) 03/07/2015    Past Surgical History:  Procedure Laterality Date  .  ABDOMINAL HYSTERECTOMY    . EYE SURGERY Bilateral 2012   cataracts  . HERNIA REPAIR    . HIP ARTHROPLASTY Right 06/04/2017   Procedure: ARTHROPLASTY BIPOLAR HIP (HEMIARTHROPLASTY);  Surgeon: Signa KellPatel, Sunny, MD;  Location: [facility] ORS;  Service: Orthopedics;  Laterality: Right;  . SPLENECTOMY    . TONSILLECTOMY      Prior to Admission medications   Medication Sig Start Date End Date Taking? Authorizing Provider  acetaminophen (TYLENOL) 500 MG tablet Take 1,000 mg by mouth 3 (three) times daily.    [provider]  apixaban (ELIQUIS) 2.5 MG TABS tablet Take 2.5 mg by mouth 2 (two) times daily. 03/07/16   [provider]  diltiazem (CARDIZEM CD) 240 MG 24 hr capsule Take 240 mg by mouth daily. 03/07/16   [provider]  hydrochlorothiazide (MICROZIDE) 12.5 MG capsule Take 12.5 mg by mouth daily.    [provider]  losartan (COZAAR) 50 MG tablet Take 1 tablet (50 mg total) by mouth daily. 05/08/17   Gabriel CirriWicker, Cheryl, NP  meloxicam (MOBIC) 15 MG tablet Take 15 mg by mouth daily. 10/20/17   [provider]  memantine (NAMENDA) 5 MG tablet Take 5 mg by mouth daily. 01/06/17 01/06/18  [provider]  Multiple Vitamin (THEREMS) TABS Take 1 tablet by mouth daily.    [provider]  oxyCODONE (OXY IR/ROXICODONE) 5 MG immediate release tablet Take 1 tablet (5 mg total) by mouth every 4 (four) hours as needed for moderate pain, severe pain or breakthrough  pain. Patient taking differently: Take 5 mg by mouth 3 (three) times daily as needed for moderate pain, severe pain or breakthrough pain.  06/06/17   Dedra Skeens, PA-C  traMADol (ULTRAM) 50 MG tablet Take 1 tablet (50 mg total) by mouth every 6 (six) hours as needed. Patient taking differently: Take 50 mg by mouth 2 (two) times daily.  08/31/17   Minna Antis, MD    Allergies Lisinopril; Penicillin g potassium [penicillin g]; and Sulfa antibiotics  Family History  Problem Relation Age of Onset    . Cancer Mother   . Cancer Father   . Heart attack Brother   . Heart attack Brother     Social History Social History   Tobacco Use  . Smoking status: Former Games developer  . Smokeless tobacco: Never Used  Substance Use Topics  . Alcohol use: No    Alcohol/week: 0.0 oz  . Drug use: No    Review of Systems Constitutional: Negative for fever. Eyes: Negative for visual changes. ENT: + Left facial bruise. no neck injury Cardiovascular: Negative for chest injury. Respiratory: Negative for shortness of breath. Negative for chest wall injury. Gastrointestinal: Negative for abdominal pain or injury. Genitourinary: Negative for dysuria. Musculoskeletal: Negative for back injury. + L knee, L wrist, L shoulder bruise Skin: + L knee skin tear Neurological: Negative for head injury.  ____________________________________________   PHYSICAL EXAM:  VITAL SIGNS: ED Triage Vitals  Enc Vitals Group     BP 12/07/17 1732 133/81     Pulse Rate 12/07/17 1732 87     Resp 12/07/17 1732 19     Temp 12/07/17 1732 98.8 F (37.1 C)     Temp Source 12/07/17 1732 Oral     SpO2 12/07/17 1732 98 %     Weight 12/07/17 1733 103 lb (46.7 kg)     Height 12/07/17 1733 5\' 4"  (1.626 m)     Head Circumference --      Peak Flow --      Pain Score 12/07/17 1733 4     Pain Loc --      Pain Edu? --      Excl. in GC? --     Constitutional: Alert and oriented x 2. No acute distress. Does not appear intoxicated. HEENT Head: Normocephalic and atraumatic. Face: No facial bony tenderness. Stable midface. Redness and tenderness to palpation on the R cheek bone Ears: No hemotympanum bilaterally. No Battle sign Eyes: No eye injury. PERRL. No raccoon eyes. IOP 19, EOMI, visual acuity 20/30 bilaterally Nose: Nontender. No epistaxis. No rhinorrhea Mouth/Throat: Mucous membranes are moist. No oropharyngeal blood. No dental injury. Airway patent without stridor. Normal voice. Neck: no C-collar in place. No midline  c-spine tenderness.  Cardiovascular: Normal rate, regular rhythm. Normal and symmetric distal pulses are present in all extremities. Pulmonary/Chest: Chest wall is stable and nontender to palpation/compression. Normal respiratory effort. Breath sounds are normal. No crepitus.  Abdominal: Soft, nontender, non distended. Musculoskeletal: Nontender with normal full range of motion in all extremities. No deformities. Bruises to the L knee, L wrist, and L shoulder. No thoracic or lumbar midline spinal tenderness. Pelvis is stable. Skin: Skin is warm, dry and intact. No abrasions or contutions. Psychiatric: Speech and behavior are appropriate. Neurological: Normal speech and language. Moves all extremities to command. No gross focal neurologic deficits are appreciated.  Glascow Coma Score: 4 - Opens eyes on own 6 - Follows simple motor commands 5 - Alert and oriented GCS: 15   ____________________________________________  LABS (all labs ordered are listed, but only abnormal results are displayed)  Labs Reviewed - No data to display ____________________________________________  EKG  none ____________________________________________  RADIOLOGY  I have personally reviewed the images performed during this visit and I agree with the Radiologist's read.   Interpretation by Radiologist:  Dg Wrist Complete Left  Result Date: 12/07/2017 CLINICAL DATA:  Recent fall with wrist pain, initial encounter EXAM: LEFT WRIST - COMPLETE 3+ VIEW COMPARISON:  None. FINDINGS: There is no evidence of fracture or dislocation. There is no evidence of arthropathy or other focal bone abnormality. Soft tissues are unremarkable. IMPRESSION: No acute abnormality noted. Electronically Signed   By: Alcide Clever M.D.   On: 12/07/2017 18:32   Ct Head Wo Contrast  Result Date: 12/07/2017 CLINICAL DATA:  Unwitnessed fall. EXAM: CT HEAD WITHOUT CONTRAST CT MAXILLOFACIAL WITHOUT CONTRAST CT CERVICAL SPINE WITHOUT  CONTRAST TECHNIQUE: Multidetector CT imaging of the head, cervical spine, and maxillofacial structures were performed using the standard protocol without intravenous contrast. Multiplanar CT image reconstructions of the cervical spine and maxillofacial structures were also generated. COMPARISON:  Head CT earlier the same day FINDINGS: CT HEAD FINDINGS Brain: There is no evidence for acute hemorrhage, hydrocephalus, mass lesion, or abnormal extra-axial fluid collection. No definite CT evidence for acute infarction. Diffuse loss of parenchymal volume is consistent with atrophy. Patchy low attenuation in the deep hemispheric and periventricular white matter is nonspecific, but likely reflects chronic microvascular ischemic demyelination. Vascular: No hyperdense vessel or unexpected calcification. Skull: No evidence for fracture. No worrisome lytic or sclerotic lesion. Other: None. CT MAXILLOFACIAL FINDINGS Osseous: Mandible is intact. Temporomandibular joints are located but degenerated. No evidence for nasal bone fracture. Right maxillary sinus and zygomatic arch are intact. Tripod fracture identified in the left face with fracture lines involving the anterior maxillary sinus, floor of left orbit, lateral maxillary sinus, and zygomatic arch. Chronic mucosal thickening is seen in both maxillary sinuses with small bilateral maxillary sinus air-fluid levels suggesting hemorrhage. Frontal sinuses, ethmoid air cells, and sphenoid sinuses are clear. No evidence for mastoid effusion. No medial orbital fracture on either side. Orbits: As above, tripod fracture involving the left face extends up into the floor of the left orbit. Sinuses: Tripod fracture involving the left maxillary sinus. Soft tissues: Negative. CT CERVICAL SPINE FINDINGS Alignment: Trace retrolisthesis of C5 on 6 is compatible with the associated loss of disc height. Skull base and vertebrae: No acute fracture. No primary bone lesion or focal pathologic  process. Soft tissues and spinal canal: No prevertebral fluid or swelling. No visible canal hematoma. Disc levels:  Loss of disc height noted C5-6. Upper chest: Biapical pleuroparenchymal scarring evident. Other: None. IMPRESSION: 1. No acute intracranial abnormality. Atrophy with chronic small vessel white matter ischemic disease. 2. Tripod fracture of the left face, involving the floor of the left orbit, anterior and lateral walls of the left maxillary sinus, and the left zygomatic arch. Small volume hemorrhage identified in the maxillary sinuses. 3. Degenerative changes without acute fracture in the cervical spine. Electronically Signed   By: Kennith Center M.D.   On: 12/07/2017 18:58   Ct Head Wo Contrast  Result Date: 12/07/2017 CLINICAL DATA:  84 year old with dementia who fell earlier today. Patient is unsure if she struck her head. Currently anticoagulated with Eliquis for atrial fibrillation. Initial encounter. EXAM: CT HEAD WITHOUT CONTRAST TECHNIQUE: Contiguous axial images were obtained from the base of the skull through the vertex without intravenous contrast. COMPARISON:  12/02/2017, 06/02/2017 and  earlier. FINDINGS: Brain: Moderate age related cortical and deep atrophy and severe changes of small vessel disease of the white matter, not significantly changed dating back to at least 2017. No mass lesion. No midline shift. No acute hemorrhage or hematoma. No extra-axial fluid collections. No evidence of acute infarction. Physiologic calcifications in the basal ganglia bilaterally. Vascular: Moderate BILATERAL carotid siphon atherosclerosis. No hyperdense vessel. Skull: No skull fracture or other focal osseous abnormality involving the skull. Sinuses/Orbits: Visualized paranasal sinuses, bilateral mastoid air cells and bilateral middle ear cavities well-aerated. Visualized orbits and globes normal in appearance. Apparent prior scleral buckle procedures. Other: None. IMPRESSION: 1. No acute intracranial  abnormality. 2. Stable moderate generalized atrophy and severe chronic microvascular ischemic changes of the white matter. Electronically Signed   By: Hulan Saas M.D.   On: 12/07/2017 13:12   Ct Cervical Spine Wo Contrast  Result Date: 12/07/2017 CLINICAL DATA:  Unwitnessed fall. EXAM: CT HEAD WITHOUT CONTRAST CT MAXILLOFACIAL WITHOUT CONTRAST CT CERVICAL SPINE WITHOUT CONTRAST TECHNIQUE: Multidetector CT imaging of the head, cervical spine, and maxillofacial structures were performed using the standard protocol without intravenous contrast. Multiplanar CT image reconstructions of the cervical spine and maxillofacial structures were also generated. COMPARISON:  Head CT earlier the same day FINDINGS: CT HEAD FINDINGS Brain: There is no evidence for acute hemorrhage, hydrocephalus, mass lesion, or abnormal extra-axial fluid collection. No definite CT evidence for acute infarction. Diffuse loss of parenchymal volume is consistent with atrophy. Patchy low attenuation in the deep hemispheric and periventricular white matter is nonspecific, but likely reflects chronic microvascular ischemic demyelination. Vascular: No hyperdense vessel or unexpected calcification. Skull: No evidence for fracture. No worrisome lytic or sclerotic lesion. Other: None. CT MAXILLOFACIAL FINDINGS Osseous: Mandible is intact. Temporomandibular joints are located but degenerated. No evidence for nasal bone fracture. Right maxillary sinus and zygomatic arch are intact. Tripod fracture identified in the left face with fracture lines involving the anterior maxillary sinus, floor of left orbit, lateral maxillary sinus, and zygomatic arch. Chronic mucosal thickening is seen in both maxillary sinuses with small bilateral maxillary sinus air-fluid levels suggesting hemorrhage. Frontal sinuses, ethmoid air cells, and sphenoid sinuses are clear. No evidence for mastoid effusion. No medial orbital fracture on either side. Orbits: As above, tripod  fracture involving the left face extends up into the floor of the left orbit. Sinuses: Tripod fracture involving the left maxillary sinus. Soft tissues: Negative. CT CERVICAL SPINE FINDINGS Alignment: Trace retrolisthesis of C5 on 6 is compatible with the associated loss of disc height. Skull base and vertebrae: No acute fracture. No primary bone lesion or focal pathologic process. Soft tissues and spinal canal: No prevertebral fluid or swelling. No visible canal hematoma. Disc levels:  Loss of disc height noted C5-6. Upper chest: Biapical pleuroparenchymal scarring evident. Other: None. IMPRESSION: 1. No acute intracranial abnormality. Atrophy with chronic small vessel white matter ischemic disease. 2. Tripod fracture of the left face, involving the floor of the left orbit, anterior and lateral walls of the left maxillary sinus, and the left zygomatic arch. Small volume hemorrhage identified in the maxillary sinuses. 3. Degenerative changes without acute fracture in the cervical spine. Electronically Signed   By: Kennith Center M.D.   On: 12/07/2017 18:58   Dg Shoulder Left  Result Date: 12/07/2017 CLINICAL DATA:  Recent fall with shoulder pain, initial encounter EXAM: LEFT SHOULDER - 2+ VIEW COMPARISON:  None. FINDINGS: There is no evidence of fracture or dislocation. There is no evidence of arthropathy or other focal  bone abnormality. Soft tissues are unremarkable. IMPRESSION: No acute abnormality noted. Electronically Signed   By: Alcide Clever M.D.   On: 12/07/2017 18:31   Dg Knee Complete 4 Views Left  Result Date: 12/07/2017 CLINICAL DATA:  Recent fall with left knee pain, initial encounter EXAM: LEFT KNEE - COMPLETE 4+ VIEW COMPARISON:  11/16/2014 FINDINGS: No evidence of fracture, dislocation, or joint effusion. No evidence of arthropathy or other focal bone abnormality. Soft tissues are unremarkable. IMPRESSION: No acute abnormality noted. Electronically Signed   By: Alcide Clever M.D.   On: 12/07/2017  18:33   Dg Hip Unilat W Or Wo Pelvis 2-3 Views Left  Result Date: 12/07/2017 CLINICAL DATA:  84 year old female with bilateral hip pain status post fall EXAM: DG HIP (WITH OR WITHOUT PELVIS) 2-3V LEFT COMPARISON:  Concurrently obtained radiographs of the right hip FINDINGS: Surgical changes of prior right hip arthroplasty. Dense sclerosis in the left femoral head consistent with avascular necrosis. No evidence of acute fracture or malalignment. The bony pelvis is intact. The bony structures appear diffusely demineralized suggesting osteoporosis. Atherosclerotic calcifications are visible in the vessels. IMPRESSION: 1. Left femoral head avascular necrosis. 2. No evidence of acute fracture or malalignment. 3. The bones appear diffusely demineralized. 4. Atherosclerotic vascular calcifications. Electronically Signed   By: Malachy Moan M.D.   On: 12/07/2017 13:18   Dg Hip Unilat With Pelvis 2-3 Views Right  Result Date: 12/07/2017 CLINICAL DATA:  84 year old female with bilateral hip pain after falling EXAM: DG HIP (WITH OR WITHOUT PELVIS) 2-3V RIGHT COMPARISON:  Concurrently obtained radiographs the left hip FINDINGS: Surgical changes of right hip arthroplasty without evidence of hardware complication. The bony pelvis is intact. The bones are diffusely demineralized in appearance. Atherosclerotic calcifications present throughout the visualized vessels. IMPRESSION: 1. Surgical changes of prior right hip arthroplasty without evidence of complication or periprosthetic fracture. 2. The bones appear demineralized. 3. Atherosclerotic vascular calcifications. Electronically Signed   By: Malachy Moan M.D.   On: 12/07/2017 13:19   Ct Maxillofacial Wo Contrast  Result Date: 12/07/2017 CLINICAL DATA:  Unwitnessed fall. EXAM: CT HEAD WITHOUT CONTRAST CT MAXILLOFACIAL WITHOUT CONTRAST CT CERVICAL SPINE WITHOUT CONTRAST TECHNIQUE: Multidetector CT imaging of the head, cervical spine, and maxillofacial structures  were performed using the standard protocol without intravenous contrast. Multiplanar CT image reconstructions of the cervical spine and maxillofacial structures were also generated. COMPARISON:  Head CT earlier the same day FINDINGS: CT HEAD FINDINGS Brain: There is no evidence for acute hemorrhage, hydrocephalus, mass lesion, or abnormal extra-axial fluid collection. No definite CT evidence for acute infarction. Diffuse loss of parenchymal volume is consistent with atrophy. Patchy low attenuation in the deep hemispheric and periventricular white matter is nonspecific, but likely reflects chronic microvascular ischemic demyelination. Vascular: No hyperdense vessel or unexpected calcification. Skull: No evidence for fracture. No worrisome lytic or sclerotic lesion. Other: None. CT MAXILLOFACIAL FINDINGS Osseous: Mandible is intact. Temporomandibular joints are located but degenerated. No evidence for nasal bone fracture. Right maxillary sinus and zygomatic arch are intact. Tripod fracture identified in the left face with fracture lines involving the anterior maxillary sinus, floor of left orbit, lateral maxillary sinus, and zygomatic arch. Chronic mucosal thickening is seen in both maxillary sinuses with small bilateral maxillary sinus air-fluid levels suggesting hemorrhage. Frontal sinuses, ethmoid air cells, and sphenoid sinuses are clear. No evidence for mastoid effusion. No medial orbital fracture on either side. Orbits: As above, tripod fracture involving the left face extends up into the floor of the  left orbit. Sinuses: Tripod fracture involving the left maxillary sinus. Soft tissues: Negative. CT CERVICAL SPINE FINDINGS Alignment: Trace retrolisthesis of C5 on 6 is compatible with the associated loss of disc height. Skull base and vertebrae: No acute fracture. No primary bone lesion or focal pathologic process. Soft tissues and spinal canal: No prevertebral fluid or swelling. No visible canal hematoma. Disc  levels:  Loss of disc height noted C5-6. Upper chest: Biapical pleuroparenchymal scarring evident. Other: None. IMPRESSION: 1. No acute intracranial abnormality. Atrophy with chronic small vessel white matter ischemic disease. 2. Tripod fracture of the left face, involving the floor of the left orbit, anterior and lateral walls of the left maxillary sinus, and the left zygomatic arch. Small volume hemorrhage identified in the maxillary sinuses. 3. Degenerative changes without acute fracture in the cervical spine. Electronically Signed   By: Kennith Center M.D.   On: 12/07/2017 18:58     ____________________________________________   PROCEDURES  Procedure(s) performed: None Procedures Critical Care performed:  None ____________________________________________   INITIAL IMPRESSION / ASSESSMENT AND PLAN / ED COURSE  84 y.o. female with a history of dementia and frequent falls who presents from a fall.  Patient is in no distress, at her baseline.  She is on blood thinners.  She has redness and tenderness to palpation over the left cheekbone, she has bruises on the left knee, left wrist, and left shoulder.  She has a skin tear over the left knee.  She has full painless range of motion's of all of these extremities.  She has no signs or symptoms of basilar skull fracture. Will give tylenol for pain, renew tetanus and get CT face/cspine/head and XRs of L knee, L wrist, L shoulder.    _________________________ 8:53 PM on 12/07/2017 -----------------------------------------  CT concerning for tripod fracture of the L face. Discussed with dr. Jenne Campus, ENT who recommended outpatient follow up. Spoke with Dr. Myna Hidalgo, ENT and [facility] since Dr. Jenne Campus does not operate facial fractures and Dr. Myna Hidalgo recommended consulting ophthalmology to eval for globe injury and recommended outpatient follow up in 3 days, no need to transfer or emergent evaluation tonight per him. There is no signs of globe injury on exam,  no entrapment, IOP 19, visual acuity is 20/30 bilaterally. Consulted Dr. Druscilla Brownie, ophthalmologist who based on my exam, IOP, and visual acuity recommended outpatient evaluation tomorrow in his office. Discussed the findings of CT and outpatient follow up with patient and SNF. Also discussed return precautions for signs of active bleeding, or entrapment and recommended return to the ER if those develop. Patient is now stable for dc.     As part of my medical decision making, I reviewed the following data within the electronic MEDICAL RECORD NUMBER Nursing notes reviewed and incorporated, Radiograph reviewed , A consult was requested and obtained from this/these consultant(s) ENT, ophthalmology, Notes from prior ED visits and [place] Controlled Substance Database    Pertinent labs & imaging results that were available during my care of the patient were reviewed by me and considered in my medical decision making (see chart for details).    ____________________________________________   FINAL CLINICAL IMPRESSION(S) / ED DIAGNOSES  Final diagnoses:  Fall, initial encounter  Closed tripod fracture of zygomaticomaxillary complex, initial encounter (HCC)  Abrasion of left shoulder, initial encounter  Abrasion of left wrist, initial encounter  Abrasion of left knee, initial encounter  Noninfected skin tear of left leg, initial encounter      NEW MEDICATIONS STARTED DURING THIS VISIT:  ED Discharge Orders    None       Note:  This document was prepared using Dragon voice recognition software and may include unintentional dictation errors.    Nita Sickle, MD 12/07/17 2104

## 2018-02-10 ENCOUNTER — Emergency Department: Payer: Medicare Other

## 2018-02-10 ENCOUNTER — Emergency Department
Admission: EM | Admit: 2018-02-10 | Discharge: 2018-02-10 | Disposition: A | Discharge status: Home / Self Care | Payer: Medicare Other | Attending: Emergency Medicine | Admitting: Emergency Medicine

## 2018-02-10 ENCOUNTER — Other Ambulatory Visit: Payer: Self-pay

## 2018-02-10 DIAGNOSIS — Z79899 Other long term (current) drug therapy: Secondary | ICD-10-CM | POA: Insufficient documentation

## 2018-02-10 DIAGNOSIS — N183 Chronic kidney disease, stage 3 (moderate): Secondary | ICD-10-CM | POA: Insufficient documentation

## 2018-02-10 DIAGNOSIS — I129 Hypertensive chronic kidney disease with stage 1 through stage 4 chronic kidney disease, or unspecified chronic kidney disease: Secondary | ICD-10-CM | POA: Insufficient documentation

## 2018-02-10 DIAGNOSIS — R531 Weakness: Secondary | ICD-10-CM | POA: Insufficient documentation

## 2018-02-10 DIAGNOSIS — Z87891 Personal history of nicotine dependence: Secondary | ICD-10-CM | POA: Diagnosis not present

## 2018-02-10 DIAGNOSIS — M549 Dorsalgia, unspecified: Secondary | ICD-10-CM | POA: Insufficient documentation

## 2018-02-10 DIAGNOSIS — Z96641 Presence of right artificial hip joint: Secondary | ICD-10-CM | POA: Insufficient documentation

## 2018-02-10 DIAGNOSIS — Z7901 Long term (current) use of anticoagulants: Secondary | ICD-10-CM | POA: Insufficient documentation

## 2018-02-10 DIAGNOSIS — R410 Disorientation, unspecified: Secondary | ICD-10-CM | POA: Diagnosis not present

## 2018-02-10 LAB — COMPREHENSIVE METABOLIC PANEL
ALT: 30 U/L (ref 14–54)
AST: 35 U/L (ref 15–41)
Albumin: 4.4 g/dL (ref 3.5–5.0)
Alkaline Phosphatase: 113 U/L (ref 38–126)
Anion gap: 13 (ref 5–15)
BUN: 25 mg/dL — ABNORMAL HIGH (ref 6–20)
CO2: 27 mmol/L (ref 22–32)
Calcium: 9.7 mg/dL (ref 8.9–10.3)
Chloride: 97 mmol/L — ABNORMAL LOW (ref 101–111)
Creatinine, Ser: 0.77 mg/dL (ref 0.44–1.00)
GFR calc Af Amer: >60 mL/min (ref >60)
GFR calc non Af Amer: >60 mL/min (ref >60)
Glucose, Bld: 135 mg/dL — ABNORMAL HIGH (ref 65–99)
Potassium: 3.1 mmol/L — ABNORMAL LOW (ref 3.5–5.1)
Sodium: 137 mmol/L (ref 135–145)
Total Bilirubin: 0.6 mg/dL (ref 0.3–1.2)
Total Protein: 8.1 g/dL (ref 6.5–8.1)

## 2018-02-10 LAB — URINALYSIS, COMPLETE (UACMP) WITH MICROSCOPIC
Bilirubin Urine: NEGATIVE
Glucose, UA: NEGATIVE mg/dL
Hgb urine dipstick: NEGATIVE
Ketones, ur: NEGATIVE mg/dL
Leukocytes, UA: NEGATIVE
Nitrite: NEGATIVE
Protein, ur: NEGATIVE mg/dL
Specific Gravity, Urine: 1.015 (ref 1.005–1.030)
pH: 5 (ref 5.0–8.0)

## 2018-02-10 LAB — CBC WITH DIFFERENTIAL/PLATELET
BASOS ABS: 0.1 10*3/uL (ref 0–0.1)
Basophils Relative: 1 %
Eosinophils Absolute: 0 10*3/uL (ref 0–0.7)
Eosinophils Relative: 0 %
HEMATOCRIT: 38.2 % (ref 35.0–47.0)
HEMOGLOBIN: 12.1 g/dL (ref 12.0–16.0)
LYMPHS PCT: 14 %
Lymphs Abs: 1.2 10*3/uL (ref 1.0–3.6)
MCH: 28.6 pg (ref 26.0–34.0)
MCHC: 31.8 g/dL — ABNORMAL LOW (ref 32.0–36.0)
MCV: 90.1 fL (ref 80.0–100.0)
MONO ABS: 0.4 10*3/uL (ref 0.2–0.9)
Monocytes Relative: 5 %
NEUTROS ABS: 6.8 10*3/uL — AB (ref 1.4–6.5)
NEUTROS PCT: 80 %
Platelets: 511 10*3/uL — ABNORMAL HIGH (ref 150–440)
RBC: 4.24 MIL/uL (ref 3.80–5.20)
RDW: 15.8 % — ABNORMAL HIGH (ref 11.5–14.5)
WBC: 8.5 10*3/uL (ref 3.6–11.0)

## 2018-02-10 LAB — TROPONIN I: Troponin I: <0.03 ng/mL (ref <0.03)

## 2018-02-10 NOTE — ED Notes (Signed)
Report given to Rachel RN

## 2018-02-10 NOTE — ED Provider Notes (Signed)
[facility] Emergency Department Provider Note ____________________________________________   First MD Initiated Contact with Patient 02/10/18 1904     (approximate)  I have reviewed the triage vital signs and the nursing notes.   HISTORY  Chief Complaint Weakness    HPI Caitlan Chauca is a 84 y.o. female with PMH as noted below who presents with increased generalized weakness over the last several days, apparently worse on the right, and associated with a fall a few days ago.  The patient's son noticed that she had difficulty using her right arm yesterday.  He states that a few days ago she was moved from assisted living to a memory care unit, and that she had seemed increasingly confused since that time, which he thought might be related to her change in environment and her cognitive impairment.  The patient reported some back pain in triage but denied pain to me.    Past Medical History:  Diagnosis Date  . Atrial fibrillation (HCC)   . Fatigue   . Hypertension   . Syncope   . Underweight     Patient Active Problem List   Diagnosis Date Noted  . Hip fracture (HCC) 06/02/2017  . Syncope and collapse 04/27/2017  . Advance care planning 02/07/2017  . Anemia 02/07/2017  . Hypocalcemia 11/15/2016  . Cognitive impairment 06/11/2016  . Wart 05/28/2016  . Sciatica 04/14/2015  . Weakness generalized 04/14/2015  . Risk for falls 04/14/2015  . Atrial fibrillation (HCC) 03/07/2015  . Syncope 03/07/2015  . Underweight 03/07/2015  . Hypertension 03/07/2015  . Osteoarthritis of left knee 03/07/2015  . Benign hypertension with CKD (chronic kidney disease) stage I 03/07/2015  . CKD (chronic kidney disease), stage III (HCC) 03/07/2015    Past Surgical History:  Procedure Laterality Date  . ABDOMINAL HYSTERECTOMY    . EYE SURGERY Bilateral 2012   cataracts  . HERNIA REPAIR    . HIP ARTHROPLASTY Right 06/04/2017   Procedure: ARTHROPLASTY BIPOLAR HIP  (HEMIARTHROPLASTY);  Surgeon: Signa Kell, MD;  Location: [facility] ORS;  Service: Orthopedics;  Laterality: Right;  . SPLENECTOMY    . TONSILLECTOMY      Prior to Admission medications   Medication Sig Start Date End Date Taking? Authorizing Provider  acetaminophen (TYLENOL) 500 MG tablet Take 1,000 mg by mouth 3 (three) times daily.    [provider]  apixaban (ELIQUIS) 2.5 MG TABS tablet Take 2.5 mg by mouth 2 (two) times daily. 03/07/16   [provider]  diltiazem (CARDIZEM CD) 240 MG 24 hr capsule Take 240 mg by mouth daily. 03/07/16   [provider]  hydrochlorothiazide (MICROZIDE) 12.5 MG capsule Take 12.5 mg by mouth daily.    [provider]  losartan (COZAAR) 50 MG tablet Take 1 tablet (50 mg total) by mouth daily. 05/08/17   Gabriel Cirri, NP  meloxicam (MOBIC) 15 MG tablet Take 15 mg by mouth daily. 10/20/17   [provider]  memantine (NAMENDA) 5 MG tablet Take 5 mg by mouth daily. 01/06/17 01/06/18  [provider]  Multiple Vitamin (THEREMS) TABS Take 1 tablet by mouth daily.    [provider]  oxyCODONE (ROXICODONE) 5 MG immediate release tablet Take 0.5 tablets (2.5 mg total) by mouth every 6 (six) hours as needed. 12/07/17 12/07/18  Nita Sickle, MD  traMADol (ULTRAM) 50 MG tablet Take 1 tablet (50 mg total) by mouth every 6 (six) hours as needed. Patient taking differently: Take 50 mg by mouth 2 (two)  times daily.  08/31/17   Minna Antis, MD    Allergies Lisinopril; Penicillin g potassium [penicillin g]; and Sulfa antibiotics  Family History  Problem Relation Age of Onset  . Cancer Mother   . Cancer Father   . Heart attack Brother   . Heart attack Brother     Social History Social History   Tobacco Use  . Smoking status: Former Games developer  . Smokeless tobacco: Never Used  Substance Use Topics  . Alcohol use: No    Alcohol/week: 0.0 oz  . Drug use: No    Review of Systems  Constitutional: No  fever. Eyes: No redness. ENT: No sore throat. Cardiovascular: Denies chest pain. Respiratory: Denies shortness of breath. Gastrointestinal: No vomiting.  Genitourinary: Negative for dysuria.  Musculoskeletal: Negative for back pain. Skin: Negative for rash. Neurological: Negative for headache.   ____________________________________________   PHYSICAL EXAM:  VITAL SIGNS: ED Triage Vitals  Enc Vitals Group     BP 02/10/18 1838 127/65     Pulse Rate 02/10/18 1838 89     Resp 02/10/18 1838 19     Temp 02/10/18 1838 98.3 F (36.8 C)     Temp Source 02/10/18 1838 Oral     SpO2 02/10/18 1838 94 %     Weight 02/10/18 1836 110 lb (49.9 kg)     Height 02/10/18 1836 5\' 3"  (1.6 m)     Head Circumference --      Peak Flow --      Pain Score 02/10/18 1836 8     Pain Loc --      Pain Edu? --      Excl. in GC? --     Constitutional: Alert and oriented.  Somewhat weak appearing overall but in no acute distress. Eyes: Conjunctivae are normal.  EOMI.  PERRLA. Head: Atraumatic. Nose: No congestion/rhinnorhea. Mouth/Throat: Mucous membranes are slightly dry.   Neck: Normal range of motion.  Cardiovascular: Normal rate, regular rhythm. Grossly normal heart sounds.  Good peripheral circulation. Respiratory: Normal respiratory effort.  No retractions. Lungs CTAB. Gastrointestinal: Soft and nontender. No distention.  Genitourinary: No flank tenderness. Musculoskeletal: No lower extremity edema.  Extremities warm and well perfused.  Neurologic:  Normal speech and language.  Motor and sensory intact to all extremities.  Possibly somewhat weak to right upper extremity.   Skin:  Skin is warm and dry. No rash noted. Psychiatric: Mood and affect are normal. Speech and behavior are normal.  ____________________________________________   LABS (all labs ordered are listed, but only abnormal results are displayed)  Labs Reviewed  CBC WITH DIFFERENTIAL/PLATELET - Abnormal; Notable for the  following components:      Result Value   MCHC 31.8 (*)    RDW 15.8 (*)    Platelets 511 (*)    Neutro Abs 6.8 (*)    All other components within normal limits  COMPREHENSIVE METABOLIC PANEL  TROPONIN I  URINALYSIS, COMPLETE (UACMP) WITH MICROSCOPIC   ____________________________________________  EKG  ED ECG REPORT I, Dionne Bucy, the attending physician, personally viewed and interpreted this ECG.  Date: 02/10/2018 EKG Time: 2052 Rate: 93 Rhythm: Atrial fibrillation QRS Axis: normal Intervals: normal ST/T Wave abnormalities: Nonspecific T wave abnormalities Narrative Interpretation: no evidence of acute ischemia  ____________________________________________  RADIOLOGY  CT head: No ICH or other acute abnormalities  CXR: Cardiomegaly with no acute findings  ____________________________________________   PROCEDURES  Procedure(s) performed: No  Procedures  Critical Care performed: No ____________________________________________   INITIAL IMPRESSION / ASSESSMENT  AND PLAN / ED COURSE  Pertinent labs & imaging results that were available during my care of the patient were reviewed by me and considered in my medical decision making (see chart for details).  84 year old female with PMH as noted above presents with increased generalized weakness, possibly somewhat worse on the right, as well as some increased confusion over the last few days with no specific time of onset.  I reviewed the past medical records in Epic; the patient was seen 3 times after falls in April with negative work-up at that time.   The exam is as described above; vital signs are normal, the patient is somewhat generally weak appearing with no focal neuro findings.  She may have some increased right-sided weakness but it is difficult to assess as her baseline is somewhat unclear.  There is no evidence of trauma, and the remainder of the exam is unremarkable.  Differential includes  subacute CVA, but also global causes of weakness such as infection, dehydration, electrolyte or other metabolic abnormality, or less likely ACS.  Plan: CT head, infection work-up, basic and cardiac labs, and reassess.  ----------------------------------------- 9:20 PM on 02/10/2018 -----------------------------------------  CT head shows no acute findings.  Lab work-up so far is unremarkable, although UA, troponin, and CMP are still pending.  I discussed the plan of care with the son.  He would like to have the patient discharged back to her facility if the work-up is negative and feels safe with this plan.  I signed the patient out to the oncoming physician Dr. Lamont Snowballifenbark.  ____________________________________________   FINAL CLINICAL IMPRESSION(S) / ED DIAGNOSES  Final diagnoses:  Weakness      NEW MEDICATIONS STARTED DURING THIS VISIT:  New Prescriptions   No medications on file     Note:  This document was prepared using Dragon voice recognition software and may include unintentional dictation errors.    Dionne BucySiadecki, Frenchie Dangerfield, MD 02/10/18 2121

## 2018-02-10 NOTE — ED Provider Notes (Signed)
Urinalysis negative.  Discharge according to Dr. Asencion PartridgeSiadecki's plan.   Merrily Brittleifenbark, Shylynn Bruning, MD 02/10/18 2318

## 2018-02-10 NOTE — Discharge Instructions (Signed)
It was a pleasure to take care of you today, and thank you for coming to our emergency department.  If you have any questions or concerns before leaving please ask the nurse to grab me and I'm more than happy to go through your aftercare instructions again.  If you were prescribed any opioid pain medication today such as Norco, Vicodin, Percocet, morphine, hydrocodone, or oxycodone please make sure you do not drive when you are taking this medication as it can alter your ability to drive safely.  If you have any concerns once you are home that you are not improving or are in fact getting worse before you can make it to your follow-up appointment, please do not hesitate to call 911 and come back for further evaluation.  Merrily Brittle, MD  Results for orders placed or performed during the hospital encounter of 02/10/18  Comprehensive metabolic panel  Result Value Ref Range   Sodium 137 135 - 145 mmol/L   Potassium 3.1 (L) 3.5 - 5.1 mmol/L   Chloride 97 (L) 101 - 111 mmol/L   CO2 27 22 - 32 mmol/L   Glucose, Bld 135 (H) 65 - 99 mg/dL   BUN 25 (H) 6 - 20 mg/dL   Creatinine, Ser 6.96 0.44 - 1.00 mg/dL   Calcium 9.7 8.9 - 78.9 mg/dL   Total Protein 8.1 6.5 - 8.1 g/dL   Albumin 4.4 3.5 - 5.0 g/dL   AST 35 15 - 41 U/L   ALT 30 14 - 54 U/L   Alkaline Phosphatase 113 38 - 126 U/L   Total Bilirubin 0.6 0.3 - 1.2 mg/dL   GFR calc non Af Amer >60 >60 mL/min   GFR calc Af Amer >60 >60 mL/min   Anion gap 13 5 - 15  CBC with Differential  Result Value Ref Range   WBC 8.5 3.6 - 11.0 K/uL   RBC 4.24 3.80 - 5.20 MIL/uL   Hemoglobin 12.1 12.0 - 16.0 g/dL   HCT 38.1 01.7 - 51.0 %   MCV 90.1 80.0 - 100.0 fL   MCH 28.6 26.0 - 34.0 pg   MCHC 31.8 (L) 32.0 - 36.0 g/dL   RDW 25.8 (H) 52.7 - 78.2 %   Platelets 511 (H) 150 - 440 K/uL   Neutrophils Relative % 80 %   Neutro Abs 6.8 (H) 1.4 - 6.5 K/uL   Lymphocytes Relative 14 %   Lymphs Abs 1.2 1.0 - 3.6 K/uL   Monocytes Relative 5 %   Monocytes  Absolute 0.4 0.2 - 0.9 K/uL   Eosinophils Relative 0 %   Eosinophils Absolute 0.0 0 - 0.7 K/uL   Basophils Relative 1 %   Basophils Absolute 0.1 0 - 0.1 K/uL  Troponin I  Result Value Ref Range   Troponin I <0.03 <0.03 ng/mL  Urinalysis, Complete w Microscopic  Result Value Ref Range   Color, Urine YELLOW (A) YELLOW   APPearance CLEAR (A) CLEAR   Specific Gravity, Urine 1.015 1.005 - 1.030   pH 5.0 5.0 - 8.0   Glucose, UA NEGATIVE NEGATIVE mg/dL   Hgb urine dipstick NEGATIVE NEGATIVE   Bilirubin Urine NEGATIVE NEGATIVE   Ketones, ur NEGATIVE NEGATIVE mg/dL   Protein, ur NEGATIVE NEGATIVE mg/dL   Nitrite NEGATIVE NEGATIVE   Leukocytes, UA NEGATIVE NEGATIVE   Ct Head Wo Contrast  Result Date: 02/10/2018 CLINICAL DATA:  Decreased mobility.  Weakness.  Fall 2 days ago. EXAM: CT HEAD WITHOUT CONTRAST TECHNIQUE: Contiguous axial images were obtained  from the base of the skull through the vertex without intravenous contrast. COMPARISON:  December 07, 2017 FINDINGS: Brain: No subdural, epidural, or subarachnoid hemorrhage no mass effect or midline shift. Ventricles sulci. Cerebellum, brainstem, and basal cisterns. White matter changes are noted. No acute cortical ischemia or infarct. Vascular: No hyperdense vessel or unexpected calcification. Skull: Normal. Negative for fracture or focal lesion. Sinuses/Orbits: No acute finding. Other: None. IMPRESSION: 1. No acute intracranial abnormalities. Chronic white matter changes. Electronically Signed   By: Gerome Samavid  Williams III M.D   On: 02/10/2018 20:25   Dg Chest Portable 1 View  Result Date: 02/10/2018 CLINICAL DATA:  Weakness and decreased mobility. Patient fell 2 days ago and has pain in the lower back. EXAM: PORTABLE CHEST 1 VIEW COMPARISON:  06/02/2017 FINDINGS: Stable cardiomegaly with moderate aortic atherosclerosis. The lungs are free of pulmonary consolidation CHF. No effusion or pneumothorax. Minimal subsegmental atelectasis and/or scarring is  suggested at the left lung base. No acute fracture or suspicious osseous lesions. Probable small hiatal hernia. IMPRESSION: Cardiomegaly with aortic atherosclerosis. No active pulmonary disease. No acute osseous appearing abnormality. Electronically Signed   By: Tollie Ethavid  Kwon M.D.   On: 02/10/2018 20:09

## 2018-02-10 NOTE — ED Triage Notes (Signed)
[facility] d/t decrease in mobility and weakness noticed by staff. EMS reports that staff stated pt fell 2 days ago and has been in pain leaning to her right side. Pt c/o right wrist pain and lower back pain at this time. Pt is A&O x4.

## 2018-02-16 ENCOUNTER — Emergency Department
Admission: EM | Admit: 2018-02-16 | Discharge: 2018-02-16 | Disposition: A | Discharge status: Home / Self Care | Payer: Medicare Other | Attending: Emergency Medicine | Admitting: Emergency Medicine

## 2018-02-16 ENCOUNTER — Encounter: Payer: Self-pay | Admitting: Emergency Medicine

## 2018-02-16 ENCOUNTER — Other Ambulatory Visit: Payer: Self-pay

## 2018-02-16 ENCOUNTER — Emergency Department: Payer: Medicare Other

## 2018-02-16 DIAGNOSIS — Z87891 Personal history of nicotine dependence: Secondary | ICD-10-CM | POA: Diagnosis not present

## 2018-02-16 DIAGNOSIS — L03116 Cellulitis of left lower limb: Secondary | ICD-10-CM

## 2018-02-16 DIAGNOSIS — Z96641 Presence of right artificial hip joint: Secondary | ICD-10-CM | POA: Diagnosis not present

## 2018-02-16 DIAGNOSIS — I129 Hypertensive chronic kidney disease with stage 1 through stage 4 chronic kidney disease, or unspecified chronic kidney disease: Secondary | ICD-10-CM | POA: Diagnosis not present

## 2018-02-16 DIAGNOSIS — M7989 Other specified soft tissue disorders: Secondary | ICD-10-CM

## 2018-02-16 DIAGNOSIS — F0390 Unspecified dementia without behavioral disturbance: Secondary | ICD-10-CM | POA: Insufficient documentation

## 2018-02-16 DIAGNOSIS — N183 Chronic kidney disease, stage 3 (moderate): Secondary | ICD-10-CM | POA: Insufficient documentation

## 2018-02-16 DIAGNOSIS — R600 Localized edema: Secondary | ICD-10-CM | POA: Diagnosis not present

## 2018-02-16 DIAGNOSIS — R609 Edema, unspecified: Secondary | ICD-10-CM

## 2018-02-16 DIAGNOSIS — Z79899 Other long term (current) drug therapy: Secondary | ICD-10-CM | POA: Diagnosis not present

## 2018-02-16 DIAGNOSIS — Z7901 Long term (current) use of anticoagulants: Secondary | ICD-10-CM | POA: Insufficient documentation

## 2018-02-16 DIAGNOSIS — R2242 Localized swelling, mass and lump, left lower limb: Secondary | ICD-10-CM | POA: Diagnosis present

## 2018-02-16 HISTORY — DX: Dementia in other diseases classified elsewhere, unspecified severity, without behavioral disturbance, psychotic disturbance, mood disturbance, and anxiety: F02.80

## 2018-02-16 LAB — CBC WITH DIFFERENTIAL/PLATELET
Basophils Absolute: 0 10*3/uL (ref 0–0.1)
Basophils Relative: 0 %
EOS PCT: 1 %
Eosinophils Absolute: 0.1 10*3/uL (ref 0–0.7)
HEMATOCRIT: 30.6 % — AB (ref 35.0–47.0)
HEMOGLOBIN: 10.7 g/dL — AB (ref 12.0–16.0)
LYMPHS PCT: 14 %
Lymphs Abs: 1.4 10*3/uL (ref 1.0–3.6)
MCH: 30.9 pg (ref 26.0–34.0)
MCHC: 34.9 g/dL (ref 32.0–36.0)
MCV: 88.5 fL (ref 80.0–100.0)
Monocytes Absolute: 0.7 10*3/uL (ref 0.2–0.9)
Monocytes Relative: 6 %
NEUTROS ABS: 8.5 10*3/uL — AB (ref 1.4–6.5)
NEUTROS PCT: 79 %
PLATELETS: 485 10*3/uL — AB (ref 150–440)
RBC: 3.45 MIL/uL — AB (ref 3.80–5.20)
RDW: 15.6 % — ABNORMAL HIGH (ref 11.5–14.5)
WBC: 10.6 10*3/uL (ref 3.6–11.0)

## 2018-02-16 LAB — COMPREHENSIVE METABOLIC PANEL
ALT: 30 U/L (ref 14–54)
ANION GAP: 11 (ref 5–15)
AST: 32 U/L (ref 15–41)
Albumin: 4 g/dL (ref 3.5–5.0)
Alkaline Phosphatase: 110 U/L (ref 38–126)
BILIRUBIN TOTAL: 0.7 mg/dL (ref 0.3–1.2)
BUN: 17 mg/dL (ref 6–20)
CHLORIDE: 103 mmol/L (ref 101–111)
CO2: 26 mmol/L (ref 22–32)
Calcium: 9 mg/dL (ref 8.9–10.3)
Creatinine, Ser: 0.73 mg/dL (ref 0.44–1.00)
Glucose, Bld: 100 mg/dL — ABNORMAL HIGH (ref 65–99)
POTASSIUM: 3.6 mmol/L (ref 3.5–5.1)
Sodium: 140 mmol/L (ref 135–145)
Total Protein: 7 g/dL (ref 6.5–8.1)

## 2018-02-16 LAB — TROPONIN I: Troponin I: <0.03 ng/mL (ref <0.03)

## 2018-02-16 LAB — BRAIN NATRIURETIC PEPTIDE: B Natriuretic Peptide: 425 pg/mL — ABNORMAL HIGH (ref 0.0–100.0)

## 2018-02-16 MED ORDER — CEPHALEXIN 250 MG PO CAPS
250.0000 mg | ORAL_CAPSULE | Freq: Once | ORAL | Status: AC
  Administered 2018-02-16: 250 mg via ORAL
  Filled 2018-02-16: qty 1

## 2018-02-16 MED ORDER — ACETAMINOPHEN 325 MG PO TABS
650.0000 mg | ORAL_TABLET | Freq: Once | ORAL | Status: AC
  Administered 2018-02-16: 650 mg via ORAL
  Filled 2018-02-16: qty 2

## 2018-02-16 MED ORDER — CEPHALEXIN 250 MG PO CAPS: 250.0000 mg | ORAL_CAPSULE | Freq: Three times a day (TID) | ORAL | 0 refills | Status: DC

## 2018-02-16 NOTE — Discharge Instructions (Addendum)
1.  Start antibiotic as prescribed (Keflex 250 mg 3 times daily for 7 days). 2.  Return to the ER for worsening symptoms, persistent vomiting, difficulty breathing or other concerns.

## 2018-02-16 NOTE — ED Notes (Signed)
Resumed care from Paula Thomas, [place]. Pt resting comfortably; nad noted. Awaiting EMS transfer

## 2018-02-16 NOTE — ED Notes (Signed)
Pt discharged home after verbalizing understanding of discharge instructions; nad noted. 

## 2018-02-16 NOTE — ED Provider Notes (Signed)
[facility] Emergency Department Provider Note   ____________________________________________   First MD Initiated Contact with Patient 02/16/18 0240     (approximate)  I have reviewed the triage vital signs and the nursing notes.   HISTORY  Chief Complaint Leg Swelling    HPI Paula Thomas is a 84 y.o. female brought [facility] the ED from [facility] EMS with a chief complaint of left lower leg swelling.  Patient has a history of atrial fibrillation, CHF, on Lasix.  Per facility staff, patient had a "high sodium" meal yesterday and complained of increased swelling and pain in her lower leg overnight.  Patient denies chest pain, shortness of breath, abdominal pain, nausea, vomiting or diarrhea.  Takes Eliquis for atrial fibrillation.  Denies recent travel or trauma.   Past Medical History:  Diagnosis Date  . Atrial fibrillation (HCC)   . Dementia due to medical condition, without behavioral disturbance   . Fatigue   . Hypertension   . Syncope   . Underweight     Patient Active Problem List   Diagnosis Date Noted  . Hip fracture (HCC) 06/02/2017  . Syncope and collapse 04/27/2017  . Advance care planning 02/07/2017  . Anemia 02/07/2017  . Hypocalcemia 11/15/2016  . Cognitive impairment 06/11/2016  . Wart 05/28/2016  . Sciatica 04/14/2015  . Weakness generalized 04/14/2015  . Risk for falls 04/14/2015  . Atrial fibrillation (HCC) 03/07/2015  . Syncope 03/07/2015  . Underweight 03/07/2015  . Hypertension 03/07/2015  . Osteoarthritis of left knee 03/07/2015  . Benign hypertension with CKD (chronic kidney disease) stage I 03/07/2015  . CKD (chronic kidney disease), stage III (HCC) 03/07/2015    Past Surgical History:  Procedure Laterality Date  . ABDOMINAL HYSTERECTOMY    . EYE SURGERY Bilateral 2012   cataracts  . HERNIA REPAIR    . HIP ARTHROPLASTY Right 06/04/2017   Procedure: ARTHROPLASTY BIPOLAR HIP (HEMIARTHROPLASTY);  Surgeon:  Signa KellPatel, Sunny, MD;  Location: [facility] ORS;  Service: Orthopedics;  Laterality: Right;  . SPLENECTOMY    . TONSILLECTOMY      Prior to Admission medications   Medication Sig Start Date End Date Taking? Authorizing Provider  acetaminophen (TYLENOL) 500 MG tablet Take 1,000 mg by mouth 3 (three) times daily.   Yes [provider]  acidophilus (RISAQUAD) CAPS capsule Take 1 capsule by mouth daily. 02/13/18 02/23/18 Yes [provider]  apixaban (ELIQUIS) 2.5 MG TABS tablet Take 2.5 mg by mouth 2 (two) times daily. 03/07/16  Yes [provider]  clindamycin (CLEOCIN) 150 MG capsule Take 150 mg by mouth 4 (four) times daily. 02/13/18 02/20/18 Yes [provider]  diltiazem (CARDIZEM CD) 240 MG 24 hr capsule Take 240 mg by mouth daily. 03/07/16  Yes [provider]  furosemide (LASIX) 20 MG tablet Take 20 mg by mouth daily.   Yes [provider]  losartan (COZAAR) 50 MG tablet Take 1 tablet (50 mg total) by mouth daily. 05/08/17  Yes Gabriel CirriWicker, Cheryl, NP  Melatonin 3 MG TABS Take 1 tablet by mouth at bedtime.   Yes [provider]  Multiple Vitamin (THEREMS) TABS Take 1 tablet by mouth daily.   Yes [provider]  polyethylene glycol (MIRALAX / GLYCOLAX) packet Take 17 g by mouth daily as needed for mild constipation or moderate constipation.    Yes [provider]  potassium chloride (K-DUR,KLOR-CON) 10 MEQ tablet Take 10 mEq by mouth daily.   Yes [provider]  cephALEXin (  KEFLEX) 250 MG capsule Take 1 capsule (250 mg total) by mouth 3 (three) times daily. 02/16/18   Irean Hong, MD  memantine (NAMENDA) 10 MG tablet Take 10 mg by mouth 2 (two) times daily.  01/06/17 02/10/18  [provider]  oxyCODONE (ROXICODONE) 5 MG immediate release tablet Take 0.5 tablets (2.5 mg total) by mouth every 6 (six) hours as needed. Patient not taking: Reported on 02/10/2018 12/07/17 12/07/18  Nita Sickle, MD  traMADol (ULTRAM) 50 MG  tablet Take 1 tablet (50 mg total) by mouth every 6 (six) hours as needed. Patient not taking: Reported on 02/10/2018 08/31/17   Minna Antis, MD    Allergies Lisinopril; Penicillin g potassium [penicillin g]; and Sulfa antibiotics  Family History  Problem Relation Age of Onset  . Cancer Mother   . Cancer Father   . Heart attack Brother   . Heart attack Brother     Social History Social History   Tobacco Use  . Smoking status: Former Games developer  . Smokeless tobacco: Never Used  Substance Use Topics  . Alcohol use: No    Alcohol/week: 0.0 oz  . Drug use: No    Review of Systems  Constitutional: No fever/chills Eyes: No visual changes. ENT: No sore throat. Cardiovascular: Denies chest pain. Respiratory: Denies shortness of breath. Gastrointestinal: No abdominal pain.  No nausea, no vomiting.  No diarrhea.  No constipation. Genitourinary: Negative for dysuria. Musculoskeletal: Positive for leg pain and swelling.  Negative for back pain. Skin: Negative for rash. Neurological: Negative for headaches, focal weakness or numbness.   ____________________________________________   PHYSICAL EXAM:  VITAL SIGNS: ED Triage Vitals  Enc Vitals Group     BP 02/16/18 0241 (!) 159/104     Pulse Rate 02/16/18 0241 (!) 103     Resp 02/16/18 0241 19     Temp 02/16/18 0241 98.2 F (36.8 C)     Temp Source 02/16/18 0241 Oral     SpO2 02/16/18 0232 96 %     Weight 02/16/18 0241 120 lb (54.4 kg)     Height 02/16/18 0241 5\' 4"  (1.626 m)     Head Circumference --      Peak Flow --      Pain Score 02/16/18 0241 7     Pain Loc --      Pain Edu? --      Excl. in GC? --     Constitutional: Asleep, awakened for exam.  Alert and oriented.  Elderly appearing and in mild acute distress. Eyes: Conjunctivae are normal. PERRL. EOMI. Head: Atraumatic. Nose: No congestion/rhinnorhea. Mouth/Throat: Mucous membranes are moist.  Oropharynx non-erythematous. Neck: No stridor.     Cardiovascular: Normal rate, irregular rhythm. Grossly normal heart sounds.  Good peripheral circulation. Respiratory: Normal respiratory effort.  No retractions. Lungs CTAB. Gastrointestinal: Soft and nontender. No distention. No abdominal bruits. No CVA tenderness. Musculoskeletal: BLE 1+ pitting edema, left worse than right.  Left lower leg warm and red.  Palpable distal pulses bilaterally.  Brisk, less than 5-second capillary refill.  Symmetrically warm limbs without evidence for ischemia.  No joint effusions. Neurologic:  Normal speech and language. No gross focal neurologic deficits are appreciated.  Skin:  Skin is warm, dry and intact. No rash noted. Psychiatric: Mood and affect are normal. Speech and behavior are normal.  ____________________________________________   LABS (all labs ordered are listed, but only abnormal results are displayed)  Labs Reviewed  COMPREHENSIVE METABOLIC PANEL - Abnormal; Notable for the following components:  Result Value   Glucose, Bld 100 (*)    All other components within normal limits  CBC WITH DIFFERENTIAL/PLATELET - Abnormal; Notable for the following components:   RBC 3.45 (*)    Hemoglobin 10.7 (*)    HCT 30.6 (*)    RDW 15.6 (*)    Platelets 485 (*)    Neutro Abs 8.5 (*)    All other components within normal limits  BRAIN NATRIURETIC PEPTIDE - Abnormal; Notable for the following components:   B Natriuretic Peptide 425.0 (*)    All other components within normal limits  TROPONIN I   ____________________________________________  EKG  ED ECG REPORT I, , J, the attending physician, personally viewed and interpreted this ECG.   Date: 02/16/2018  EKG Time: 0234  Rate: 95  Rhythm: atrial fibrillation, rate 95  Axis: Normal  Intervals:none  ST&T Change: Nonspecific  ____________________________________________  RADIOLOGY  ED MD interpretation:  No CHF; no DVT  Official radiology report(s): US Venous Img Lower  Unilateral Left  Result Date: 02/16/2018 CLINICAL DATA:  Calf pain. EXAM: Left LOWER EXTREMITY VENOUS DOPPLER ULTRASOUND TECHNIQUE: Gray-scale sonography with graded compression, as well as color Doppler and duplex ultrasound were performed to evaluate the lower extremity deep venous systems from the level of the common femoral vein and including the common femoral, femoral, profunda femoral, popliteal and calf veins including the posterior tibial, peroneal and gastrocnemius veins when visible. The superficial great saphenous vein was also interrogated. Spectral Doppler was utilized to evaluate flow at rest and with distal augmentation maneuvers in the common femoral, femoral and popliteal veins. COMPARISON:  None. FINDINGS: Contralateral Common Femoral Vein: Respiratory phasicity is normal and symmetric with the symptomatic side. No evidence of thrombus. Normal compressibility. Common Femoral Vein: No evidence of thrombus. Normal compressibility, respiratory phasicity and response to augmentation. Saphenofemoral Junction: No evidence of thrombus. Normal compressibility and flow on color Doppler imaging. Profunda Femoral Vein: No evidence of thrombus. Normal compressibility and flow on color Doppler imaging. Femoral Vein: No evidence of thrombus. Normal compressibility, respiratory phasicity and response to augmentation. Popliteal Vein: No evidence of thrombus. Normal compressibility, respiratory phasicity and response to augmentation. Calf Veins: No evidence of thrombus. Normal compressibility and flow on color Doppler imaging. Superficial Great Saphenous Vein: No evidence of thrombus. Normal compressibility. Venous Reflux:  None. Other Findings: Edematous changes in the subcutaneous soft tissues over the lower leg and ankle region. No loculated collections. IMPRESSION: No evidence of deep venous thrombosis in the left lower extremity. Soft tissue edema. Electronically Signed   By: Burman Nieves M.D.   On:  02/16/2018 04:32   Dg Chest Portable 1 View  Result Date: 02/16/2018 CLINICAL DATA:  Lower leg edema EXAM: PORTABLE CHEST 1 VIEW COMPARISON:  02/10/2018 FINDINGS: Cardiomegaly with mild vascular congestion. No large effusion. Bibasilar atelectasis. Aortic atherosclerosis. No pneumothorax. IMPRESSION: 1. Cardiomegaly without overt edema. 2. Patchy bibasilar atelectasis. Electronically Signed   By: Jasmine Pang M.D.   On: 02/16/2018 02:55    ____________________________________________   PROCEDURES  Procedure(s) performed: None  Procedures  Critical Care performed: No  ____________________________________________   INITIAL IMPRESSION / ASSESSMENT AND PLAN / ED COURSE  As part of my medical decision making, I reviewed the following data within the electronic MEDICAL RECORD NUMBER Nursing notes reviewed and incorporated, Labs reviewed, EKG interpreted, Old chart reviewed, Radiograph reviewed and Notes from prior ED visits   84 year old female with atrial fibrillation on Eliquis, CHF on Lasix, who presents with increased left lower leg  swelling and pain after a salty meal.  Differential diagnosis includes but is not limited to peripheral edema, CHF exacerbation, DVT, cellulitis, etc.   Will obtain screening lab work including BMP, chest x-ray, Doppler ultrasound to evaluate for DVT.  Administer Tylenol for pain.   Clinical Course as of Feb 16 718  Mon Feb 16, 2018  0501 Patient sleeping no acute distress.  Updated her of all test results.  Will place her on Keflex for cellulitis.  Strict return precautions given.  Patient verbalizes understanding and agrees with plan of care.   [JS]    Clinical Course User Index [JS] Irean Hong, MD     ____________________________________________   FINAL CLINICAL IMPRESSION(S) / ED DIAGNOSES  Final diagnoses:  Leg swelling  Peripheral edema  Cellulitis of left lower extremity     ED Discharge Orders        Ordered    cephALEXin  (KEFLEX) 250 MG capsule  3 times daily     02/16/18 0503       Note:  This document was prepared using Dragon voice recognition software and may include unintentional dictation errors.    Irean Hong, MD 02/16/18 [phone]

## 2018-02-16 NOTE — ED Notes (Signed)
Daughter called; she is waiting [facility]; wanted to know if she should come and pick pt up. Explained that we have no idea when EMS will come, and she is welcome to pick her up if she wants to. She will let us know.

## 2018-02-16 NOTE — ED Triage Notes (Signed)
Patient presents to Emergency Department [facility] with complaints of lower leg edema.  Per staff at facility pt had "high sodium" meal yesterday and now with pain and weeping lower extremities.   Pt hx of Afib, Dementia, CHF with lasix use  Pt report pain in legs and c/o being cold

## 2018-02-16 NOTE — ED Notes (Addendum)
Pt appears without respiratory distress, hard of hearing, +3 pitting edema to lower legs - worse distal to knees - feet especially edematous, left leg appears red, no definitive pain to palpation

## 2018-03-24 ENCOUNTER — Other Ambulatory Visit: Payer: Self-pay

## 2018-03-24 ENCOUNTER — Emergency Department: Payer: Medicare Other

## 2018-03-24 ENCOUNTER — Inpatient Hospital Stay
Admission: EM | Admit: 2018-03-24 | Discharge: 2018-03-31 | DRG: 377 | Disposition: A | Discharge status: Hospice | Payer: Medicare Other | Attending: Internal Medicine | Admitting: Internal Medicine

## 2018-03-24 DIAGNOSIS — Z87891 Personal history of nicotine dependence: Secondary | ICD-10-CM

## 2018-03-24 DIAGNOSIS — Z90710 Acquired absence of both cervix and uterus: Secondary | ICD-10-CM

## 2018-03-24 DIAGNOSIS — K922 Gastrointestinal hemorrhage, unspecified: Secondary | ICD-10-CM | POA: Diagnosis not present

## 2018-03-24 DIAGNOSIS — Z66 Do not resuscitate: Secondary | ICD-10-CM | POA: Diagnosis present

## 2018-03-24 DIAGNOSIS — Z993 Dependence on wheelchair: Secondary | ICD-10-CM

## 2018-03-24 DIAGNOSIS — D62 Acute posthemorrhagic anemia: Secondary | ICD-10-CM | POA: Diagnosis present

## 2018-03-24 DIAGNOSIS — Z515 Encounter for palliative care: Secondary | ICD-10-CM | POA: Diagnosis present

## 2018-03-24 DIAGNOSIS — Z8249 Family history of ischemic heart disease and other diseases of the circulatory system: Secondary | ICD-10-CM

## 2018-03-24 DIAGNOSIS — Z7901 Long term (current) use of anticoagulants: Secondary | ICD-10-CM

## 2018-03-24 DIAGNOSIS — F0280 Dementia in other diseases classified elsewhere without behavioral disturbance: Secondary | ICD-10-CM | POA: Diagnosis present

## 2018-03-24 DIAGNOSIS — Z79891 Long term (current) use of opiate analgesic: Secondary | ICD-10-CM

## 2018-03-24 DIAGNOSIS — N3001 Acute cystitis with hematuria: Secondary | ICD-10-CM | POA: Diagnosis present

## 2018-03-24 DIAGNOSIS — R410 Disorientation, unspecified: Secondary | ICD-10-CM

## 2018-03-24 DIAGNOSIS — W19XXXA Unspecified fall, initial encounter: Secondary | ICD-10-CM

## 2018-03-24 DIAGNOSIS — N183 Chronic kidney disease, stage 3 (moderate): Secondary | ICD-10-CM | POA: Diagnosis present

## 2018-03-24 DIAGNOSIS — I129 Hypertensive chronic kidney disease with stage 1 through stage 4 chronic kidney disease, or unspecified chronic kidney disease: Secondary | ICD-10-CM | POA: Diagnosis present

## 2018-03-24 DIAGNOSIS — Z96641 Presence of right artificial hip joint: Secondary | ICD-10-CM | POA: Diagnosis present

## 2018-03-24 DIAGNOSIS — I4891 Unspecified atrial fibrillation: Secondary | ICD-10-CM | POA: Diagnosis present

## 2018-03-24 DIAGNOSIS — Z79899 Other long term (current) drug therapy: Secondary | ICD-10-CM

## 2018-03-24 DIAGNOSIS — Z681 Body mass index (BMI) 19 or less, adult: Secondary | ICD-10-CM

## 2018-03-24 DIAGNOSIS — W1830XA Fall on same level, unspecified, initial encounter: Secondary | ICD-10-CM | POA: Diagnosis present

## 2018-03-24 DIAGNOSIS — E43 Unspecified severe protein-calorie malnutrition: Secondary | ICD-10-CM | POA: Diagnosis present

## 2018-03-24 DIAGNOSIS — Z9081 Acquired absence of spleen: Secondary | ICD-10-CM

## 2018-03-24 DIAGNOSIS — I482 Chronic atrial fibrillation: Secondary | ICD-10-CM | POA: Diagnosis present

## 2018-03-24 DIAGNOSIS — Z79890 Hormone replacement therapy (postmenopausal): Secondary | ICD-10-CM

## 2018-03-24 DIAGNOSIS — I872 Venous insufficiency (chronic) (peripheral): Secondary | ICD-10-CM | POA: Diagnosis present

## 2018-03-24 DIAGNOSIS — G9341 Metabolic encephalopathy: Secondary | ICD-10-CM | POA: Diagnosis present

## 2018-03-24 LAB — LACTIC ACID, PLASMA: Lactic Acid, Venous: 0.9 mmol/L (ref 0.5–1.9)

## 2018-03-24 LAB — CBC WITH DIFFERENTIAL/PLATELET
BASOS ABS: 0.1 10*3/uL (ref 0–0.1)
Basophils Relative: 1 %
Eosinophils Absolute: 0 10*3/uL (ref 0–0.7)
Eosinophils Relative: 0 %
HEMATOCRIT: 27.6 % — AB (ref 35.0–47.0)
HEMOGLOBIN: 8.9 g/dL — AB (ref 12.0–16.0)
LYMPHS PCT: 7 %
Lymphs Abs: 1 10*3/uL (ref 1.0–3.6)
MCH: 27.2 pg (ref 26.0–34.0)
MCHC: 32.4 g/dL (ref 32.0–36.0)
MCV: 83.8 fL (ref 80.0–100.0)
MONOS PCT: 8 %
Monocytes Absolute: 1.1 10*3/uL — ABNORMAL HIGH (ref 0.2–0.9)
NEUTROS ABS: 12 10*3/uL — AB (ref 1.4–6.5)
Neutrophils Relative %: 84 %
Platelets: 570 10*3/uL — ABNORMAL HIGH (ref 150–440)
RBC: 3.29 MIL/uL — ABNORMAL LOW (ref 3.80–5.20)
RDW: 15.9 % — AB (ref 11.5–14.5)
WBC: 14.3 10*3/uL — ABNORMAL HIGH (ref 3.6–11.0)

## 2018-03-24 LAB — BASIC METABOLIC PANEL
ANION GAP: 9 (ref 5–15)
BUN: 21 mg/dL (ref 8–23)
CALCIUM: 9.1 mg/dL (ref 8.9–10.3)
CHLORIDE: 103 mmol/L (ref 98–111)
CO2: 27 mmol/L (ref 22–32)
CREATININE: 0.86 mg/dL (ref 0.44–1.00)
GFR calc Af Amer: >60 mL/min (ref >60)
GLUCOSE: 105 mg/dL — AB (ref 70–99)
Potassium: 3.7 mmol/L (ref 3.5–5.1)
Sodium: 139 mmol/L (ref 135–145)

## 2018-03-24 LAB — TYPE AND SCREEN
ABO/RH(D): B POS
Antibody Screen: NEGATIVE

## 2018-03-24 MED ORDER — SODIUM CHLORIDE 0.9 % IV BOLUS
1000.0000 mL | Freq: Once | INTRAVENOUS | Status: AC
  Administered 2018-03-24: 1000 mL via INTRAVENOUS

## 2018-03-24 MED ORDER — CEFTRIAXONE SODIUM 1 G IJ SOLR
1.0000 g | Freq: Once | INTRAMUSCULAR | Status: AC
Start: 2018-03-24 — End: 2018-03-24
  Administered 2018-03-24: 1 g via INTRAVENOUS
  Filled 2018-03-24: qty 10

## 2018-03-24 NOTE — ED Provider Notes (Addendum)
[facility] Emergency Department Provider Note  ____________________________________________  Time seen: Approximately 9:34 PM  I have reviewed the triage vital signs and the nursing notes.   HISTORY  Chief Complaint Fall  Level 5 caveat:  Portions of the history and physical were unable to be obtained due to dementia   HPI Paula Thomas is a 84 y.o. female history as documented below including Eliquis for atrial fibrillation who presents for evaluation after a fall.  Patient is wheelchair-bound.  She will do her self into the bathroom and was trying to stand up by herself and fell.  She fell onto her buttock.  She does not believe she hit her head.  She has no pain.  She denies headache, chest pain, neck pain, back pain, hip pain, extremity pain, abdominal pain.  Patient has been slightly more confused than baseline according to her daughter and he was recently diagnosed with a UTI.  They received a message from patient's doctor today that her urine grew 3 different bacteria and that Levaquin had been sent to the pharmacy.  Patient has not been started on Levaquin yet.  No fever, no abdominal pain, no dysuria, no nausea or vomiting.  The fall was unwitnessed.  Patient has a history of dementia.  Past Medical History:  Diagnosis Date  . Atrial fibrillation (HCC)   . Dementia due to medical condition, without behavioral disturbance   . Fatigue   . Hypertension   . Syncope   . Underweight     Patient Active Problem List   Diagnosis Date Noted  . Hip fracture (HCC) 06/02/2017  . Syncope and collapse 04/27/2017  . Advance care planning 02/07/2017  . Anemia 02/07/2017  . Hypocalcemia 11/15/2016  . Cognitive impairment 06/11/2016  . Wart 05/28/2016  . Sciatica 04/14/2015  . Weakness generalized 04/14/2015  . Risk for falls 04/14/2015  . Atrial fibrillation (HCC) 03/07/2015  . Syncope 03/07/2015  . Underweight 03/07/2015  . Hypertension 03/07/2015    . Osteoarthritis of left knee 03/07/2015  . Benign hypertension with CKD (chronic kidney disease) stage I 03/07/2015  . CKD (chronic kidney disease), stage III (HCC) 03/07/2015    Past Surgical History:  Procedure Laterality Date  . ABDOMINAL HYSTERECTOMY    . EYE SURGERY Bilateral 2012   cataracts  . HERNIA REPAIR    . HIP ARTHROPLASTY Right 06/04/2017   Procedure: ARTHROPLASTY BIPOLAR HIP (HEMIARTHROPLASTY);  Surgeon: Signa Kell, MD;  Location: [facility] ORS;  Service: Orthopedics;  Laterality: Right;  . SPLENECTOMY    . TONSILLECTOMY      Prior to Admission medications   Medication Sig Start Date End Date Taking? Authorizing Provider  acetaminophen (TYLENOL) 500 MG tablet Take 1,000 mg by mouth 3 (three) times daily.    [provider]  apixaban (ELIQUIS) 2.5 MG TABS tablet Take 2.5 mg by mouth 2 (two) times daily. 03/07/16   [provider]  cephALEXin (KEFLEX) 250 MG capsule Take 1 capsule (250 mg total) by mouth 3 (three) times daily. 02/16/18   Irean Hong, MD  diltiazem (CARDIZEM CD) 240 MG 24 hr capsule Take 240 mg by mouth daily. 03/07/16   [provider]  furosemide (LASIX) 20 MG tablet Take 20 mg by mouth daily.    [provider]  losartan (COZAAR) 50 MG tablet Take 1 tablet (50 mg total) by mouth daily. 05/08/17   Gabriel Cirri, NP  Melatonin 3 MG TABS Take 1 tablet by mouth at bedtime.  [provider]  memantine (NAMENDA) 10 MG tablet Take 10 mg by mouth 2 (two) times daily.  01/06/17 02/10/18  [provider]  Multiple Vitamin (THEREMS) TABS Take 1 tablet by mouth daily.    [provider]  oxyCODONE (ROXICODONE) 5 MG immediate release tablet Take 0.5 tablets (2.5 mg total) by mouth every 6 (six) hours as needed. Patient not taking: Reported on 02/10/2018 12/07/17 12/07/18  Nita SickleVeronese, [place], MD  polyethylene glycol [facility](MIRALAX / Ethelene HalGLYCOLAX) packet Take 17 g by mouth daily as needed for mild constipation or moderate  constipation.     [provider]  potassium chloride (K-DUR,KLOR-CON) 10 MEQ tablet Take 10 mEq by mouth daily.    [provider]  traMADol (ULTRAM) 50 MG tablet Take 1 tablet (50 mg total) by mouth every 6 (six) hours as needed. Patient not taking: Reported on 02/10/2018 08/31/17   Minna AntisPaduchowski, Kevin, MD    Allergies Lisinopril; Penicillin g potassium [penicillin g]; and Sulfa antibiotics  Family History  Problem Relation Age of Onset  . Cancer Mother   . Cancer Father   . Heart attack Brother   . Heart attack Brother     Social History Social History   Tobacco Use  . Smoking status: Former Games developermoker  . Smokeless tobacco: Never Used  Substance Use Topics  . Alcohol use: No    Alcohol/week: 0.0 oz  . Drug use: No    Review of Systems Constitutional: Negative for fever. + confusion Eyes: Negative for visual changes. ENT: Negative for facial injury or neck injury Cardiovascular: Negative for chest injury. Respiratory: Negative for shortness of breath. Negative for chest wall injury. Gastrointestinal: Negative for abdominal pain or injury. Genitourinary: Negative for dysuria. Musculoskeletal: Negative for back injury, negative for arm or leg pain. Skin: Negative for laceration/abrasions. Neurological: Negative for head injury.   ____________________________________________   PHYSICAL EXAM:  VITAL SIGNS: ED Triage Vitals  Enc Vitals Group     BP 03/24/18 2025 120/86     Pulse Rate 03/24/18 2025 86     Resp 03/24/18 2025 (!) 25     Temp 03/24/18 2025 99.2 F (37.3 C)     Temp Source 03/24/18 2025 Oral     SpO2 03/24/18 2025 96 %     Weight 03/24/18 2026 120 lb (54.4 kg)     Height 03/24/18 2026 5\' 6"  (1.676 m)     Head Circumference --      Peak Flow --      Pain Score 03/24/18 2025 0     Pain Loc --      Pain Edu? --      Excl. in GC? --     Constitutional: Alert and oriented. No acute distress. Does not appear intoxicated. HEENT Head:  Normocephalic and atraumatic. Face: No facial bony tenderness. Stable midface Ears: No hemotympanum bilaterally. No Battle sign Eyes: No eye injury. PERRL. No raccoon eyes Nose: Nontender. No epistaxis. No rhinorrhea Mouth/Throat: Mucous membranes are dry. No oropharyngeal blood. No dental injury. Airway patent without stridor. Normal voice. Neck: no C-collar in place. No midline c-spine tenderness.  Cardiovascular: Normal rate, regular rhythm. Normal and symmetric distal pulses are present in all extremities. Pulmonary/Chest: Chest wall is stable and nontender to palpation/compression. Normal respiratory effort. Breath sounds are normal. No crepitus.  Abdominal: Soft, nontender, non distended. Musculoskeletal: Nontender with normal full range of motion in all extremities. No deformities. No thoracic or lumbar midline spinal tenderness. Pelvis is stable. Skin: Skin is warm,  dry and intact. No abrasions or contutions. Psychiatric: Speech and behavior are appropriate. Neurological: Normal speech and language. Moves all extremities to command. No gross focal neurologic deficits are appreciated.  Glascow Coma Score: 4 - Opens eyes on own 6 - Follows simple motor commands 5 - Alert and oriented GCS: 15  ____________________________________________   LABS (all labs ordered are listed, but only abnormal results are displayed)  Labs Reviewed  CBC WITH DIFFERENTIAL/PLATELET - Abnormal; Notable for the following components:      Result Value   WBC 14.3 (*)    RBC 3.29 (*)    Hemoglobin 8.9 (*)    HCT 27.6 (*)    RDW 15.9 (*)    Platelets 570 (*)    Neutro Abs 12.0 (*)    Monocytes Absolute 1.1 (*)    All other components within normal limits  BASIC METABOLIC PANEL - Abnormal; Notable for the following components:   Glucose, Bld 105 (*)    All other components within normal limits  LACTIC ACID, PLASMA  URINALYSIS, COMPLETE (UACMP) WITH MICROSCOPIC  TYPE AND SCREEN    ____________________________________________  EKG  ED ECG REPORT I, Nita Sickle, the attending physician, personally viewed and interpreted this ECG.  Atrial fibrillation, rate of 87, normal QTC, normal axis, no ST elevations or depressions.  Unchanged from prior. ____________________________________________  RADIOLOGY  I have personally reviewed the images performed during this visit and I agree with the Radiologist's read.   Interpretation by Radiologist:  Ct Head Wo Contrast  Result Date: 03/24/2018 CLINICAL DATA:  84 year old with head trauma.  Fall. EXAM: CT HEAD WITHOUT CONTRAST TECHNIQUE: Contiguous axial images were obtained from the base of the skull through the vertex without intravenous contrast. COMPARISON:  02/10/2018 FINDINGS: Brain: Stable cerebral atrophy with diffuse low-density throughout the periventricular and subcortical white matter suggesting chronic changes. No evidence for acute hemorrhage, mass lesion, midline shift, hydrocephalus or large infarct. Stable linear low-density area in the left cerebellar hemisphere may represent an old infarct. Vascular: No hyperdense vessel or unexpected calcification. Skull: Normal. Negative for fracture or focal lesion. Sinuses/Orbits: No acute finding. Other: None. IMPRESSION: No acute intracranial abnormality. Atrophy and evidence for chronic small vessel ischemic disease. Electronically Signed   By: Richarda Overlie M.D.   On: 03/24/2018 21:35     ____________________________________________   PROCEDURES  Procedure(s) performed: None Procedures Critical Care performed:  None ____________________________________________   INITIAL IMPRESSION / ASSESSMENT AND PLAN / ED COURSE  84 y.o. female history as documented below including Eliquis for atrial fibrillation who presents for evaluation after a unwitnessed fall and mild confusion.  Patient was diagnosed with a UTI but has not started on antibiotics yet.  Unfortunately  I am unable to see the results of patient urinalysis or urine culture.  At this time there are no signs of sepsis, patient is afebrile with no tachycardia.  UA will be repeated.  We will also send basic labs including lactic, CBC, and BMP. Will give a dose of IV rocephin. Patient looks dry on exam, will give IVF. EKG showing known A. fib with no ischemic changes.  Head CT was done since patient is on Eliquis and is negative for intracranial bleed.  No other signs of trauma based on history and physical exam.    _________________________ 10:45 PM on 03/24/2018 -----------------------------------------  Labs showing leukocytosis most likely from the UTI. Patient underwent I&O for urinalysis but no urine was obtained. Will repeat after IVF. Hgb is 8.9 (10.7 last month).  Rectal done which showed brown stool guaiac positive. Patient is on insulin. Will send type and screen and admit for monitoring of hgb and IV abx for UTI.   As part of my medical decision making, I reviewed the following data within the electronic MEDICAL RECORD NUMBER History obtained from family, Nursing notes reviewed and incorporated, Labs reviewed , EKG interpreted , Old EKG reviewed, Old chart reviewed, Radiograph reviewed , Discussed with admitting physician , Notes from prior ED visits and [place] Controlled Substance Database    Pertinent labs & imaging results that were available during my care of the patient were reviewed by me and considered in my medical decision making (see chart for details).    ____________________________________________   FINAL CLINICAL IMPRESSION(S) / ED DIAGNOSES  Final diagnoses:  Fall, initial encounter  Acute cystitis with hematuria  Lower GI bleed  Confusion      NEW MEDICATIONS STARTED DURING THIS VISIT:  ED Discharge Orders    None       Note:  This document was prepared using Dragon voice recognition software and may include unintentional dictation errors.    Don Perking, [place],  MD 03/24/18 2248    Nita Sickle, MD 03/24/18 2249

## 2018-03-24 NOTE — ED Notes (Signed)
In and out cath attempted by this RN and Butch RN unsuccessfully. Dr Don PerkingVeronese notified.

## 2018-03-24 NOTE — ED Triage Notes (Signed)
[facility]. EMS states pt was trying to get up on own and fell in the floor unwitnessed. Pt states that she did not LOC and just "fell and didn't lose her balance." Pt has slight dementia per EMS.

## 2018-03-25 DIAGNOSIS — Z79890 Hormone replacement therapy (postmenopausal): Secondary | ICD-10-CM | POA: Diagnosis not present

## 2018-03-25 DIAGNOSIS — Z79891 Long term (current) use of opiate analgesic: Secondary | ICD-10-CM | POA: Diagnosis not present

## 2018-03-25 DIAGNOSIS — G9341 Metabolic encephalopathy: Secondary | ICD-10-CM | POA: Diagnosis present

## 2018-03-25 DIAGNOSIS — Z96641 Presence of right artificial hip joint: Secondary | ICD-10-CM | POA: Diagnosis present

## 2018-03-25 DIAGNOSIS — K922 Gastrointestinal hemorrhage, unspecified: Secondary | ICD-10-CM | POA: Diagnosis present

## 2018-03-25 DIAGNOSIS — E43 Unspecified severe protein-calorie malnutrition: Secondary | ICD-10-CM | POA: Diagnosis present

## 2018-03-25 DIAGNOSIS — Z9081 Acquired absence of spleen: Secondary | ICD-10-CM | POA: Diagnosis not present

## 2018-03-25 DIAGNOSIS — Z515 Encounter for palliative care: Secondary | ICD-10-CM | POA: Diagnosis present

## 2018-03-25 DIAGNOSIS — I482 Chronic atrial fibrillation: Secondary | ICD-10-CM | POA: Diagnosis present

## 2018-03-25 DIAGNOSIS — W1830XA Fall on same level, unspecified, initial encounter: Secondary | ICD-10-CM | POA: Diagnosis present

## 2018-03-25 DIAGNOSIS — Z993 Dependence on wheelchair: Secondary | ICD-10-CM | POA: Diagnosis not present

## 2018-03-25 DIAGNOSIS — I4891 Unspecified atrial fibrillation: Secondary | ICD-10-CM | POA: Diagnosis present

## 2018-03-25 DIAGNOSIS — N3001 Acute cystitis with hematuria: Secondary | ICD-10-CM | POA: Diagnosis present

## 2018-03-25 DIAGNOSIS — I872 Venous insufficiency (chronic) (peripheral): Secondary | ICD-10-CM | POA: Diagnosis present

## 2018-03-25 DIAGNOSIS — I129 Hypertensive chronic kidney disease with stage 1 through stage 4 chronic kidney disease, or unspecified chronic kidney disease: Secondary | ICD-10-CM | POA: Diagnosis present

## 2018-03-25 DIAGNOSIS — Z90710 Acquired absence of both cervix and uterus: Secondary | ICD-10-CM | POA: Diagnosis not present

## 2018-03-25 DIAGNOSIS — Z87891 Personal history of nicotine dependence: Secondary | ICD-10-CM | POA: Diagnosis not present

## 2018-03-25 DIAGNOSIS — Z7901 Long term (current) use of anticoagulants: Secondary | ICD-10-CM | POA: Diagnosis not present

## 2018-03-25 DIAGNOSIS — Z8249 Family history of ischemic heart disease and other diseases of the circulatory system: Secondary | ICD-10-CM | POA: Diagnosis not present

## 2018-03-25 DIAGNOSIS — Z79899 Other long term (current) drug therapy: Secondary | ICD-10-CM | POA: Diagnosis not present

## 2018-03-25 DIAGNOSIS — F0280 Dementia in other diseases classified elsewhere without behavioral disturbance: Secondary | ICD-10-CM | POA: Diagnosis present

## 2018-03-25 DIAGNOSIS — N183 Chronic kidney disease, stage 3 (moderate): Secondary | ICD-10-CM | POA: Diagnosis present

## 2018-03-25 DIAGNOSIS — R410 Disorientation, unspecified: Secondary | ICD-10-CM | POA: Diagnosis present

## 2018-03-25 DIAGNOSIS — Z66 Do not resuscitate: Secondary | ICD-10-CM | POA: Diagnosis present

## 2018-03-25 DIAGNOSIS — Z681 Body mass index (BMI) 19 or less, adult: Secondary | ICD-10-CM | POA: Diagnosis not present

## 2018-03-25 DIAGNOSIS — D62 Acute posthemorrhagic anemia: Secondary | ICD-10-CM | POA: Diagnosis present

## 2018-03-25 LAB — BASIC METABOLIC PANEL
Anion gap: 7 (ref 5–15)
Anion gap: 8 (ref 5–15)
BUN: 20 mg/dL (ref 8–23)
BUN: 19 mg/dL (ref 8–23)
CHLORIDE: 107 mmol/L (ref 98–111)
CO2: 23 mmol/L (ref 22–32)
CO2: 23 mmol/L (ref 22–32)
CREATININE: 0.71 mg/dL (ref 0.44–1.00)
CREATININE: 0.76 mg/dL (ref 0.44–1.00)
Calcium: 8.2 mg/dL — ABNORMAL LOW (ref 8.9–10.3)
Calcium: 8.1 mg/dL — ABNORMAL LOW (ref 8.9–10.3)
Chloride: 109 mmol/L (ref 98–111)
GFR calc Af Amer: >60 mL/min (ref >60)
GFR calc Af Amer: >60 mL/min (ref >60)
GFR calc non Af Amer: >60 mL/min (ref >60)
GFR calc non Af Amer: >60 mL/min (ref >60)
GLUCOSE: 126 mg/dL — AB (ref 70–99)
GLUCOSE: 112 mg/dL — AB (ref 70–99)
POTASSIUM: 4 mmol/L (ref 3.5–5.1)
Potassium: 3.8 mmol/L (ref 3.5–5.1)
SODIUM: 139 mmol/L (ref 135–145)
Sodium: 138 mmol/L (ref 135–145)

## 2018-03-25 LAB — CBC
HEMATOCRIT: 26.3 % — AB (ref 35.0–47.0)
Hemoglobin: 8.7 g/dL — ABNORMAL LOW (ref 12.0–16.0)
MCH: 27.6 pg (ref 26.0–34.0)
MCHC: 33 g/dL (ref 32.0–36.0)
MCV: 83.9 fL (ref 80.0–100.0)
PLATELETS: 557 10*3/uL — AB (ref 150–440)
RBC: 3.14 MIL/uL — ABNORMAL LOW (ref 3.80–5.20)
RDW: 16.2 % — AB (ref 11.5–14.5)
WBC: 13.3 10*3/uL — ABNORMAL HIGH (ref 3.6–11.0)

## 2018-03-25 LAB — URINALYSIS, COMPLETE (UACMP) WITH MICROSCOPIC
BACTERIA UA: NONE SEEN
BILIRUBIN URINE: NEGATIVE
Glucose, UA: NEGATIVE mg/dL
Hgb urine dipstick: NEGATIVE
Ketones, ur: NEGATIVE mg/dL
Leukocytes, UA: NEGATIVE
Nitrite: NEGATIVE
PROTEIN: NEGATIVE mg/dL
SPECIFIC GRAVITY, URINE: 1.017 (ref 1.005–1.030)
Squamous Epithelial / LPF: NONE SEEN (ref 0–5)
WBC UA: NONE SEEN WBC/hpf (ref 0–5)
pH: 5 (ref 5.0–8.0)

## 2018-03-25 LAB — FERRITIN: Ferritin: 60 ng/mL (ref 11–307)

## 2018-03-25 LAB — GLUCOSE, CAPILLARY: Glucose-Capillary: 130 mg/dL — ABNORMAL HIGH (ref 70–99)

## 2018-03-25 MED ORDER — POTASSIUM CHLORIDE CRYS ER 10 MEQ PO TBCR
10.0000 meq | EXTENDED_RELEASE_TABLET | Freq: Every day | ORAL | Status: DC
  Administered 2018-03-25 – 2018-03-26 (×2): 10 meq via ORAL
  Filled 2018-03-25 (×3): qty 1

## 2018-03-25 MED ORDER — CLINDAMYCIN HCL 150 MG PO CAPS
300.0000 mg | ORAL_CAPSULE | Freq: Three times a day (TID) | ORAL | Status: DC
  Administered 2018-03-25 – 2018-03-27 (×5): 300 mg via ORAL
  Filled 2018-03-25 (×9): qty 2

## 2018-03-25 MED ORDER — POLYETHYLENE GLYCOL 3350 17 G PO PACK
17.0000 g | PACK | Freq: Every day | ORAL | Status: DC | PRN
  Administered 2018-03-28: 17 g via ORAL
  Filled 2018-03-25: qty 1

## 2018-03-25 MED ORDER — RISAQUAD PO CAPS
1.0000 | ORAL_CAPSULE | Freq: Every day | ORAL | Status: DC
  Administered 2018-03-25 – 2018-03-26 (×2): 1 via ORAL
  Filled 2018-03-25 (×3): qty 1

## 2018-03-25 MED ORDER — FUROSEMIDE 20 MG PO TABS
20.0000 mg | ORAL_TABLET | Freq: Every day | ORAL | Status: DC
  Administered 2018-03-25: 20 mg via ORAL
  Filled 2018-03-25: qty 1

## 2018-03-25 MED ORDER — ONDANSETRON HCL 4 MG PO TABS: 4.0000 mg | ORAL_TABLET | Freq: Four times a day (QID) | ORAL | Status: DC | PRN

## 2018-03-25 MED ORDER — AMIODARONE HCL IN DEXTROSE 360-4.14 MG/200ML-% IV SOLN
60.0000 mg/h | INTRAVENOUS | Status: AC
  Administered 2018-03-25 (×2): 60 mg/h via INTRAVENOUS
  Filled 2018-03-25 (×2): qty 200

## 2018-03-25 MED ORDER — FAMOTIDINE IN NACL 20-0.9 MG/50ML-% IV SOLN
20.0000 mg | Freq: Two times a day (BID) | INTRAVENOUS | Status: DC
  Administered 2018-03-25: 20 mg via INTRAVENOUS
  Filled 2018-03-25: qty 50

## 2018-03-25 MED ORDER — ACETAMINOPHEN 650 MG RE SUPP: 650.0000 mg | Freq: Four times a day (QID) | RECTAL | Status: DC | PRN

## 2018-03-25 MED ORDER — BISACODYL 5 MG PO TBEC
5.0000 mg | DELAYED_RELEASE_TABLET | Freq: Every day | ORAL | Status: DC | PRN
  Administered 2018-03-28: 5 mg via ORAL
  Filled 2018-03-25: qty 1

## 2018-03-25 MED ORDER — METOPROLOL TARTRATE 5 MG/5ML IV SOLN
5.0000 mg | Freq: Once | INTRAVENOUS | Status: AC
  Administered 2018-03-25: 5 mg via INTRAVENOUS
  Filled 2018-03-25: qty 5

## 2018-03-25 MED ORDER — MELATONIN 5 MG PO TABS
2.5000 mg | ORAL_TABLET | Freq: Every day | ORAL | Status: DC
  Administered 2018-03-25 – 2018-03-28 (×4): 2.5 mg via ORAL
  Filled 2018-03-25 (×6): qty 0.5

## 2018-03-25 MED ORDER — MELATONIN 3 MG PO TABS: 1.0000 | ORAL_TABLET | Freq: Every day | ORAL | Status: DC

## 2018-03-25 MED ORDER — DILTIAZEM HCL ER COATED BEADS 120 MG PO CP24
240.0000 mg | ORAL_CAPSULE | Freq: Every day | ORAL | Status: DC
  Administered 2018-03-25 – 2018-03-26 (×2): 240 mg via ORAL
  Filled 2018-03-25 (×3): qty 2

## 2018-03-25 MED ORDER — MIRTAZAPINE 15 MG PO TABS
7.5000 mg | ORAL_TABLET | Freq: Every day | ORAL | Status: DC
  Administered 2018-03-25 – 2018-03-28 (×4): 7.5 mg via ORAL
  Filled 2018-03-25 (×4): qty 1

## 2018-03-25 MED ORDER — THEREMS PO TABS: 1.0000 | ORAL_TABLET | Freq: Every day | ORAL | Status: DC

## 2018-03-25 MED ORDER — HYDROCODONE-ACETAMINOPHEN 5-325 MG PO TABS
1.0000 | ORAL_TABLET | ORAL | Status: DC | PRN
  Administered 2018-03-26: 2 via ORAL
  Filled 2018-03-25: qty 2

## 2018-03-25 MED ORDER — SERTRALINE HCL 25 MG PO TABS
12.5000 mg | ORAL_TABLET | Freq: Every day | ORAL | Status: DC
  Administered 2018-03-25 – 2018-03-26 (×2): 12.5 mg via ORAL
  Filled 2018-03-25 (×7): qty 0.5

## 2018-03-25 MED ORDER — AMIODARONE LOAD VIA INFUSION
150.0000 mg | Freq: Once | INTRAVENOUS | Status: AC
  Administered 2018-03-25: 150 mg via INTRAVENOUS
  Filled 2018-03-25: qty 83.34

## 2018-03-25 MED ORDER — ACETAMINOPHEN 325 MG PO TABS: 650.0000 mg | ORAL_TABLET | Freq: Four times a day (QID) | ORAL | Status: DC | PRN

## 2018-03-25 MED ORDER — SODIUM CHLORIDE 0.9 % IV SOLN
INTRAVENOUS | Status: DC
  Administered 2018-03-25: 07:00:00 via INTRAVENOUS

## 2018-03-25 MED ORDER — ADULT MULTIVITAMIN W/MINERALS CH
1.0000 | ORAL_TABLET | Freq: Every day | ORAL | Status: DC
  Administered 2018-03-25 – 2018-03-26 (×2): 1 via ORAL
  Filled 2018-03-25 (×3): qty 1

## 2018-03-25 MED ORDER — AMIODARONE HCL IN DEXTROSE 360-4.14 MG/200ML-% IV SOLN
30.0000 mg/h | INTRAVENOUS | Status: DC
  Administered 2018-03-25 – 2018-03-26 (×5): 30 mg/h via INTRAVENOUS
  Filled 2018-03-25 (×3): qty 200

## 2018-03-25 MED ORDER — FOSFOMYCIN TROMETHAMINE 3 G PO PACK
3.0000 g | PACK | ORAL | Status: DC
  Administered 2018-03-25: 3 g via ORAL
  Filled 2018-03-25: qty 3

## 2018-03-25 MED ORDER — DOCUSATE SODIUM 100 MG PO CAPS
100.0000 mg | ORAL_CAPSULE | Freq: Two times a day (BID) | ORAL | Status: DC
  Administered 2018-03-25 – 2018-03-28 (×6): 100 mg via ORAL
  Filled 2018-03-25 (×7): qty 1

## 2018-03-25 MED ORDER — TRAMADOL HCL 50 MG PO TABS
50.0000 mg | ORAL_TABLET | Freq: Four times a day (QID) | ORAL | Status: DC | PRN
Start: 2018-03-25 — End: 2018-03-30
  Filled 2018-03-25: qty 1

## 2018-03-25 MED ORDER — LOSARTAN POTASSIUM 50 MG PO TABS
50.0000 mg | ORAL_TABLET | Freq: Every day | ORAL | Status: DC
  Administered 2018-03-25 – 2018-03-26 (×2): 50 mg via ORAL
  Filled 2018-03-25 (×3): qty 1

## 2018-03-25 MED ORDER — SODIUM CHLORIDE 0.9 % IV SOLN
1.0000 g | INTRAVENOUS | Status: DC
  Filled 2018-03-25: qty 10

## 2018-03-25 MED ORDER — TRAZODONE HCL 50 MG PO TABS
25.0000 mg | ORAL_TABLET | Freq: Every evening | ORAL | Status: DC | PRN
  Administered 2018-03-25 – 2018-03-28 (×3): 25 mg via ORAL
  Filled 2018-03-25 (×3): qty 1

## 2018-03-25 MED ORDER — PANTOPRAZOLE SODIUM 40 MG IV SOLR
40.0000 mg | Freq: Two times a day (BID) | INTRAVENOUS | Status: DC
  Administered 2018-03-25 – 2018-03-29 (×6): 40 mg via INTRAVENOUS
  Filled 2018-03-25 (×8): qty 40

## 2018-03-25 MED ORDER — MEMANTINE HCL 5 MG PO TABS
10.0000 mg | ORAL_TABLET | Freq: Two times a day (BID) | ORAL | Status: DC
  Administered 2018-03-25 – 2018-03-28 (×6): 10 mg via ORAL
  Filled 2018-03-25 (×12): qty 2

## 2018-03-25 MED ORDER — ACIDOPHILUS/PECTIN PO CAPS: 1.0000 | ORAL_CAPSULE | Freq: Every day | ORAL | Status: DC

## 2018-03-25 MED ORDER — ONDANSETRON HCL 4 MG/2ML IJ SOLN: 4.0000 mg | Freq: Four times a day (QID) | INTRAMUSCULAR | Status: DC | PRN

## 2018-03-25 NOTE — Progress Notes (Signed)
Notified by NT Asher MuirJamie that patient had BM and refused to have female external catheter replaced after being cleaned up. Lamonte RicherKara A Aiesha Leland, RN

## 2018-03-25 NOTE — H&P (Signed)
[facility] - [place] at [facility]   PATIENT NAME: Paula Thomas    MR#:  [id number]  DATE OF BIRTH:  09/30/1931  DATE OF ADMISSION:  03/24/2018  PRIMARY CARE PHYSICIAN: [facility]   REQUESTING/REFERRING PHYSICIAN:   CHIEF COMPLAINT:   Chief Complaint  Patient presents with  . Fall    HISTORY OF PRESENT ILLNESS: Paula Thomas  is a 84 y.o. female with a known history of atrial fibrillation on Eliquis, hypertension, dementia. Patient is unable to provide reliable history, due to her dementia.  Most of the information was taken from reviewing the medical records and from discussion with emergency room physician. She was brought from her assisted living to emergency room for evaluation after a mechanical fall.  Apparently she fell on her bottom while trying to get up.  She denies hitting her head.  No headache, chest pain, neck pain, back pain, hip pain, extremity pain, no abdominal pain, no bleeding.  Per family, patient has been slightly more confused in the past 2 to 3 days and she was recently diagnosed with a UTI.  She was supposed to be started on Levaquin, by her primary care doctor, per culture report, but she did not get the chance to take the first pill yet. Blood test done emergency room are notable for hemoglobin level at 8.9 and WBC at 14.3.  Fecal occult test done in emergency room is positive. CT of the head is negative for any acute intracranial abnormality. Patient is admitted for further evaluation and treatment.  PAST MEDICAL HISTORY:   Past Medical History:  Diagnosis Date  . Atrial fibrillation (HCC)   . Dementia due to medical condition, without behavioral disturbance   . Fatigue   . Hypertension   . Syncope   . Underweight     PAST SURGICAL HISTORY:  Past Surgical History:  Procedure Laterality Date  . ABDOMINAL HYSTERECTOMY    . EYE SURGERY Bilateral 2012   cataracts  . HERNIA REPAIR    . HIP  ARTHROPLASTY Right 06/04/2017   Procedure: ARTHROPLASTY BIPOLAR HIP (HEMIARTHROPLASTY);  Surgeon: Signa Kell, MD;  Location: [facility] ORS;  Service: Orthopedics;  Laterality: Right;  . SPLENECTOMY    . TONSILLECTOMY      SOCIAL HISTORY:  Social History   Tobacco Use  . Smoking status: Former Games developer  . Smokeless tobacco: Never Used  Substance Use Topics  . Alcohol use: No    Alcohol/week: 0.0 oz    FAMILY HISTORY:  Family History  Problem Relation Age of Onset  . Cancer Mother   . Cancer Father   . Heart attack Brother   . Heart attack Brother     DRUG ALLERGIES:  Allergies  Allergen Reactions  . Lisinopril Cough  . Penicillin G Potassium [Penicillin G]     Has patient had a PCN reaction causing immediate rash, facial/tongue/throat swelling, SOB or lightheadedness with hypotension: Unknown Has patient had a PCN reaction causing severe rash involving mucus membranes or skin necrosis: Unknown Has patient had a PCN reaction that required hospitalization: Unknown Has patient had a PCN reaction occurring within the last 10 years: Unknown If all of the above answers are "NO", then may proceed with Cephalosporin use.   . Sulfa Antibiotics     unknown    REVIEW OF SYSTEMS:   Unable to obtain due to patient's confusion and poor memory.  MEDICATIONS AT HOME:  Prior to Admission medications   Medication Sig Start Date  End Date Taking? Authorizing Provider  acetaminophen (TYLENOL) 500 MG tablet Take 1,000 mg by mouth 3 (three) times daily.   Yes [provider]  apixaban (ELIQUIS) 2.5 MG TABS tablet Take 2.5 mg by mouth 2 (two) times daily. 03/07/16  Yes [provider]  diltiazem (CARDIZEM CD) 240 MG 24 hr capsule Take 240 mg by mouth daily. 03/07/16  Yes [provider]  furosemide (LASIX) 20 MG tablet Take 20 mg by mouth daily.   Yes [provider]  Lactobacillus Acid-Pectin (ACIDOPHILUS/PECTIN) CAPS Take 1 capsule by mouth daily. For 10 days    Yes [provider]  levofloxacin (LEVAQUIN) 500 MG tablet Take 500 mg by mouth daily. For 10 days   Yes [provider]  losartan (COZAAR) 50 MG tablet Take 1 tablet (50 mg total) by mouth daily. 05/08/17  Yes Gabriel CirriWicker, Cheryl, NP  Melatonin 3 MG TABS Take 1 tablet by mouth at bedtime.   Yes [provider]  mirtazapine (REMERON) 15 MG tablet Take 7.5 mg by mouth at bedtime.   Yes [provider]  Multiple Vitamin (THEREMS) TABS Take 1 tablet by mouth daily.   Yes [provider]  polyethylene glycol (MIRALAX / GLYCOLAX) packet Take 17 g by mouth daily as needed for mild constipation or moderate constipation.    Yes [provider]  potassium chloride (K-DUR,KLOR-CON) 10 MEQ tablet Take 10 mEq by mouth daily.   Yes [provider]  sertraline (ZOLOFT) 25 MG tablet Take 12.5 mg by mouth daily at 12 noon.   Yes [provider]  cephALEXin (KEFLEX) 250 MG capsule Take 1 capsule (250 mg total) by mouth 3 (three) times daily. Patient not taking: Reported on 03/24/2018 02/16/18   Irean HongSung, Jade J, MD  memantine (NAMENDA) 10 MG tablet Take 10 mg by mouth 2 (two) times daily.  01/06/17 02/10/18  [provider]  oxyCODONE (ROXICODONE) 5 MG immediate release tablet Take 0.5 tablets (2.5 mg total) by mouth every 6 (six) hours as needed. Patient not taking: Reported on 02/10/2018 12/07/17 12/07/18  Nita SickleVeronese, [place], MD  traMADol (ULTRAM) 50 MG tablet Take 1 tablet (50 mg total) by mouth every 6 (six) hours as needed. Patient not taking: Reported on 02/10/2018 08/31/17   Minna AntisPaduchowski, Kevin, MD      PHYSICAL EXAMINATION:   VITAL SIGNS: Blood pressure (!) 153/69, pulse (!) 109, temperature 98.7 F (37.1 C), resp. rate (!) 21, height 5\' 6"  (1.676 m), weight 54.4 kg (120 lb), SpO2 92 %.  GENERAL:  84 y.o.-year-old patient lying in the bed with no acute distress.  She is pleasantly confused, she looks acutely ill but nontoxic. EYES: Pupils  equal, round, reactive to light and accommodation. No scleral icterus.  HEENT: Head atraumatic, normocephalic. Oropharynx and nasopharynx clear.  NECK:  Supple, no jugular venous distention. No thyroid enlargement, no tenderness.  LUNGS: Reduced breath sounds bilaterally, no wheezing. No use of accessory muscles of respiration.  CARDIOVASCULAR: S1, S2 normal. No S3/S4.  ABDOMEN: Soft, nontender, nondistended. Bowel sounds present. No organomegaly or mass.  EXTREMITIES: No pedal edema, cyanosis, or clubbing.  NEUROLOGIC: No focal weakness. PSYCHIATRIC: The patient is alert, but confused.Marland Kitchen.  SKIN: No obvious rash, lesion, or ulcer.   LABORATORY PANEL:   CBC Recent Labs  Lab 03/24/18 2033  WBC 14.3*  HGB 8.9*  HCT 27.6*  PLT 570*  MCV 83.8  MCH 27.2  MCHC 32.4  RDW 15.9*  LYMPHSABS 1.0  MONOABS 1.1*  EOSABS 0.0  BASOSABS 0.1   ------------------------------------------------------------------------------------------------------------------  Chemistries  Recent Labs  Lab 03/24/18 2033  NA 139  K 3.7  CL 103  CO2 27  GLUCOSE 105*  BUN 21  CREATININE 0.86  CALCIUM 9.1   ------------------------------------------------------------------------------------------------------------------ estimated creatinine clearance is 42.6 mL/min (by C-G formula based on SCr of 0.86 mg/dL). ------------------------------------------------------------------------------------------------------------------ No results for input(s): TSH, T4TOTAL, T3FREE, THYROIDAB in the last 72 hours.  Invalid input(s): FREET3   Coagulation profile No results for input(s): INR, PROTIME in the last 168 hours. ------------------------------------------------------------------------------------------------------------------- No results for input(s): DDIMER in the last 72 hours. -------------------------------------------------------------------------------------------------------------------  Cardiac  Enzymes No results for input(s): CKMB, TROPONINI, MYOGLOBIN in the last 168 hours.  Invalid input(s): CK ------------------------------------------------------------------------------------------------------------------ Invalid input(s): POCBNP  ---------------------------------------------------------------------------------------------------------------  Urinalysis    Component Value Date/Time   COLORURINE YELLOW (A) 02/10/2018 2142   APPEARANCEUR CLEAR (A) 02/10/2018 2142   LABSPEC 1.015 02/10/2018 2142   PHURINE 5.0 02/10/2018 2142   GLUCOSEU NEGATIVE 02/10/2018 2142   HGBUR NEGATIVE 02/10/2018 2142   BILIRUBINUR NEGATIVE 02/10/2018 2142   KETONESUR NEGATIVE 02/10/2018 2142   PROTEINUR NEGATIVE 02/10/2018 2142   NITRITE NEGATIVE 02/10/2018 2142   LEUKOCYTESUR NEGATIVE 02/10/2018 2142     RADIOLOGY: Ct Head Wo Contrast  Result Date: 03/24/2018 CLINICAL DATA:  84 year old with head trauma.  Fall. EXAM: CT HEAD WITHOUT CONTRAST TECHNIQUE: Contiguous axial images were obtained from the base of the skull through the vertex without intravenous contrast. COMPARISON:  02/10/2018 FINDINGS: Brain: Stable cerebral atrophy with diffuse low-density throughout the periventricular and subcortical white matter suggesting chronic changes. No evidence for acute hemorrhage, mass lesion, midline shift, hydrocephalus or large infarct. Stable linear low-density area in the left cerebellar hemisphere may represent an old infarct. Vascular: No hyperdense vessel or unexpected calcification. Skull: Normal. Negative for fracture or focal lesion. Sinuses/Orbits: No acute finding. Other: None. IMPRESSION: No acute intracranial abnormality. Atrophy and evidence for chronic small vessel ischemic disease. Electronically Signed   By: Richarda Overlie M.D.   On: 03/24/2018 21:35    EKG: Orders placed or performed during the hospital encounter of 03/24/18  . EKG 12-Lead  . EKG 12-Lead  . EKG 12-Lead  . EKG 12-Lead   . ED EKG  . ED EKG    IMPRESSION AND PLAN:  1.  Acute encephalopathy, likely related to UTI, will start treatment with IV antibiotic and IV fluids, while waiting for final urine culture report. 2.  Anemia, likely secondary to GI bleed.  Fecal occult test is positive.  Hemoglobin level is currently low at 8.9, a significant drop from 12.1 noted in the records 5 weeks ago.  Will start treatment with PPI.  Will avoid anticoagulants at this time and consult gastroenterology for further evaluation and treatment. 3.  Acute UTI, see treatment as above under #1. 4.  Chronic atrial fibrillation, rate controlled, continue medical treatment with Cardizem.  We will hold anticoagulation with Eliquis at this time due to GI bleed and low hemoglobin level. 5.  Hypertension, stable, continue home medications. 6.  Patient's CODE STATUS is DNR   All the records are reviewed and case discussed with ED provider. Management plans discussed with the patient, family and they are in agreement.  CODE STATUS: DNR Code Status History    Date Active Date Inactive Code Status Order ID Comments User Context   06/02/2017 1719 06/06/2017 1837 DNR [id number]  Adrian Saran, MD Inpatient   04/27/2017 1735 04/28/2017 2041 Full Code [id number]  Auburn Bilberry, MD Inpatient  Questions for Most Recent Historical Code Status (Order [id number])    Question Answer Comment   In the event of cardiac or respiratory ARREST Do not call a "code blue"    In the event of cardiac or respiratory ARREST Do not perform Intubation, CPR, defibrillation or ACLS    In the event of cardiac or respiratory ARREST Use medication by any route, position, wound care, and other measures to relive pain and suffering. May use oxygen, suction and manual treatment of airway obstruction as needed for comfort.         Advance Directive Documentation     Most Recent Value  Type of Advance Directive  Out of facility DNR (pink MOST or yellow form), Healthcare  Power of Attorney  Pre-existing out of facility DNR order (yellow form or pink MOST form)  -  "MOST" Form in Place?  -       TOTAL TIME TAKING CARE OF THIS PATIENT: 45 minutes.    Cammy Copa M.D on 03/25/2018 at 12:33 AM  Between 7am to 6pm - Pager - [phone]  After 6pm go to [email or web address] - password [email or web address] [facility]  [place] [facility]  Office  [phone]  CC: Primary care physician; [facility]

## 2018-03-25 NOTE — ED Provider Notes (Signed)
ED ECG REPORT I, Rebecka ApleyWebster,  Chevez Sambrano P, the attending physician, personally viewed and interpreted this ECG.   Date: 03/25/2018  EKG Time: 150  Rate: 132  Rhythm: atrial fibrillation with rapid ventricular rate  Axis: none  Intervals:none  ST&T Change: none  I did notice that the patient's heart rate became faster and faster while she was in the emergency department.  I did give the patient a 5 milligrams dose of Lopressor which did help slow down her heart rate some.  I did monitor for a few more hours and her heart rate increased again so I ordered another dose of Lopressor.  The patient is waiting for a bed at this time.    Rebecka ApleyWebster, Gladine Plude P, MD 03/25/18 [phone]

## 2018-03-25 NOTE — Consult Note (Signed)
GI Inpatient Consult Note  Reason for Consult: GI Bleed   Attending Requesting Consult: Dr. Renae Gloss, MD  History of Present Illness: Paula Thomas is a 84 y.o. female seen for evaluation of GI Bleed at the request of Dr. Renae Gloss, MD. Pt has PMH of atrial fibrillation on Eliquis, hypertension, and dementia. Pt presented to the ED yesterday from [facility] after an unwitnessed mechanical fall. She was recently diagnosed with UTI but had not started antibiotic therapy. Hemoglobin in ED found to be 8.9 and rectal exam showed brown stool guaiac positive. Hemoglobin about 5 weeks ago was 12.1. GI was consulted due to symptomatic anemia and GI bleed.  Patient seen and examined resting comfortably in hospital bed. Patient is currently recently IV fluids and IV antibiotics for UTI, waiting on culture report. Pt's daughter, Andria Rhein, is present and helps to answer all history questions. Daughter reports pt was in her normal state of health, alert and oriented yesterday around dinner time. After the daughter left the assisted living facility, she got a call around 8PM that her mother had fallen. Hemoglobin today is 8.7. She denies gross blood or any BMs since admission. Over the past several days, pt's BMs have been brown in color with no bright red blood or dark, tarry stools. Pt reports she is no pain. She denies nausea, vomiting, dysphagia, GERD symptoms, abdominal pain. Per daughter, pt had a colonoscopy previously but she is unsure when this was completed. No prior EGD. No family hx of colon cancer, polyps, or IBD. No recent unintentional weight loss. No frequent NSAID use.    Past Medical History:  Past Medical History:  Diagnosis Date  . Atrial fibrillation (HCC)   . Dementia due to medical condition, without behavioral disturbance   . Fatigue   . Hypertension   . Syncope   . Underweight     Problem List: Patient Active Problem List   Diagnosis Date Noted  . GIB  (gastrointestinal bleeding) 03/25/2018  . GI bleed 03/25/2018  . Atrial fibrillation with RVR (HCC) 03/25/2018  . Hip fracture (HCC) 06/02/2017  . Syncope and collapse 04/27/2017  . Advance care planning 02/07/2017  . Anemia 02/07/2017  . Hypocalcemia 11/15/2016  . Cognitive impairment 06/11/2016  . Wart 05/28/2016  . Sciatica 04/14/2015  . Weakness generalized 04/14/2015  . Risk for falls 04/14/2015  . Atrial fibrillation (HCC) 03/07/2015  . Syncope 03/07/2015  . Underweight 03/07/2015  . Hypertension 03/07/2015  . Osteoarthritis of left knee 03/07/2015  . Benign hypertension with CKD (chronic kidney disease) stage I 03/07/2015  . CKD (chronic kidney disease), stage III (HCC) 03/07/2015    Past Surgical History: Past Surgical History:  Procedure Laterality Date  . ABDOMINAL HYSTERECTOMY    . EYE SURGERY Bilateral 2012   cataracts  . HERNIA REPAIR    . HIP ARTHROPLASTY Right 06/04/2017   Procedure: ARTHROPLASTY BIPOLAR HIP (HEMIARTHROPLASTY);  Surgeon: Signa Kell, MD;  Location: [facility] ORS;  Service: Orthopedics;  Laterality: Right;  . SPLENECTOMY    . TONSILLECTOMY      Allergies: Allergies  Allergen Reactions  . Lisinopril Cough  . Penicillin G Potassium [Penicillin G]     Has patient had a PCN reaction causing immediate rash, facial/tongue/throat swelling, SOB or lightheadedness with hypotension: Unknown Has patient had a PCN reaction causing severe rash involving mucus membranes or skin necrosis: Unknown Has patient had a PCN reaction that required hospitalization: Unknown Has patient had a PCN reaction occurring within the last 10 years:  Unknown If all of the above answers are "NO", then may proceed with Cephalosporin use.   . Sulfa Antibiotics     unknown    Home Medications: Medications Prior to Admission  Medication Sig Dispense Refill Last Dose  . acetaminophen (TYLENOL) 500 MG tablet Take 1,000 mg by mouth 3 (three) times daily.   03/24/2018 at 1400  .  apixaban (ELIQUIS) 2.5 MG TABS tablet Take 2.5 mg by mouth 2 (two) times daily.   03/24/2018 at 0900  . diltiazem (CARDIZEM CD) 240 MG 24 hr capsule Take 240 mg by mouth daily.   03/24/2018 at 0900  . furosemide (LASIX) 20 MG tablet Take 20 mg by mouth daily.   03/24/2018 at 0800  . Lactobacillus Acid-Pectin (ACIDOPHILUS/PECTIN) CAPS Take 1 capsule by mouth daily. For 10 days   03/24/2018 at 0800  . levofloxacin (LEVAQUIN) 500 MG tablet Take 500 mg by mouth daily. For 10 days   03/24/2018 at 0800  . losartan (COZAAR) 50 MG tablet Take 1 tablet (50 mg total) by mouth daily. 90 tablet 3 03/24/2018 at 0800  . Melatonin 3 MG TABS Take 1 tablet by mouth at bedtime.   03/23/2018 at 2000  . mirtazapine (REMERON) 15 MG tablet Take 7.5 mg by mouth at bedtime.   03/23/2018 at 2000  . Multiple Vitamin (THEREMS) TABS Take 1 tablet by mouth daily.   03/24/2018 at 0900  . polyethylene glycol (MIRALAX / GLYCOLAX) packet Take 17 g by mouth daily as needed for mild constipation or moderate constipation.    prn at prn  . potassium chloride (K-DUR,KLOR-CON) 10 MEQ tablet Take 10 mEq by mouth daily.   03/24/2018 at 0800  . sertraline (ZOLOFT) 25 MG tablet Take 12.5 mg by mouth daily at 12 noon.   03/24/2018 at 1200  . cephALEXin (KEFLEX) 250 MG capsule Take 1 capsule (250 mg total) by mouth 3 (three) times daily. (Patient not taking: Reported on 03/24/2018) 21 capsule 0 Completed Course at Unknown time  . memantine (NAMENDA) 10 MG tablet Take 10 mg by mouth 2 (two) times daily.    02/10/2018 at 0900  . oxyCODONE (ROXICODONE) 5 MG immediate release tablet Take 0.5 tablets (2.5 mg total) by mouth every 6 (six) hours as needed. (Patient not taking: Reported on 02/10/2018) 8 tablet 0 Not Taking at Unknown time  . traMADol (ULTRAM) 50 MG tablet Take 1 tablet (50 mg total) by mouth every 6 (six) hours as needed. (Patient not taking: Reported on 02/10/2018) 15 tablet 0 Not Taking at Unknown time   Home medication reconciliation was  completed with the patient.   Scheduled Inpatient Medications:   . acidophilus  1 capsule Oral Daily  . diltiazem  240 mg Oral Daily  . docusate sodium  100 mg Oral BID  . furosemide  20 mg Oral Daily  . losartan  50 mg Oral Daily  . Melatonin  2.5 mg Oral QHS  . memantine  10 mg Oral BID  . mirtazapine  7.5 mg Oral QHS  . multivitamin with minerals  1 tablet Oral Daily  . potassium chloride  10 mEq Oral Daily  . sertraline  12.5 mg Oral Q1200    Continuous Inpatient Infusions:   . sodium chloride 40 mL/hr at 03/25/18 0804  . amiodarone 60 mg/hr (03/25/18 1001)   Followed by  . amiodarone    . cefTRIAXone (ROCEPHIN) IVPB 1 gram/100 mL NS (Mini-Bag Plus)    . famotidine (PEPCID) IV Stopped (03/25/18 0845)  PRN Inpatient Medications:  acetaminophen **OR** acetaminophen, bisacodyl, HYDROcodone-acetaminophen, ondansetron **OR** ondansetron (ZOFRAN) IV, polyethylene glycol, traMADol, traZODone  Family History: family history includes Cancer in her father and mother; Heart attack in her brother and brother.  The patient's family history is negative for inflammatory bowel disorders, GI malignancy, or solid organ transplantation.  Social History:   reports that she has quit smoking. She has never used smokeless tobacco. She reports that she does not drink alcohol or use drugs. The patient denies ETOH, tobacco, or drug use.   Review of Systems: Constitutional: Weight is stable.  Eyes: No changes in vision. ENT: No oral lesions, sore throat.  GI: see HPI.  Heme/Lymph: No easy bruising.  CV: No chest pain.  GU: No hematuria.  Integumentary: No rashes.  Neuro: No headaches.  Psych: No depression/anxiety.  Endocrine: No heat/cold intolerance.  Allergic/Immunologic: No urticaria.  Resp: No cough, SOB.  Musculoskeletal: No joint swelling.    Physical Examination: BP (!) 130/100   Pulse (!) 142   Temp 99.2 F (37.3 C) (Oral)   Resp 20   Ht 5\' 6"  (1.676 m)   Wt 47 kg (103  lb 9.6 oz)   LMP  (LMP Unknown)   SpO2 95%   BMI 16.72 kg/m  Elderly female resting comfortably in hospital bed. Weak and tired, unable to answer all questions appropriately due to confusion away from baseline Gen: NAD, alert and oriented x 4 HEENT: PEERLA, EOMI, Neck: supple, no JVD or thyromegaly Chest: CTA bilaterally, no wheezes, crackles, or other adventitious sounds CV: Irregularly irregular rhythm, no m/g/c/r Abd: soft, NT, ND, +BS in all four quadrants; no HSM, guarding, ridigity, or rebound tenderness Ext: no edema, well perfused with 2+ pulses, Skin: no rash or lesions noted Lymph: no LAD  Data: Lab Results  Component Value Date   WBC 13.3 (H) 03/25/2018   HGB 8.7 (L) 03/25/2018   HCT 26.3 (L) 03/25/2018   MCV 83.9 03/25/2018   PLT 557 (H) 03/25/2018   Recent Labs  Lab 03/24/18 2033 03/25/18 0651  HGB 8.9* 8.7*   Lab Results  Component Value Date   NA 139 03/25/2018   K 4.0 03/25/2018   CL 109 03/25/2018   CO2 23 03/25/2018   BUN 20 03/25/2018   CREATININE 0.71 03/25/2018   Lab Results  Component Value Date   ALT 30 02/16/2018   AST 32 02/16/2018   ALKPHOS 110 02/16/2018   BILITOT 0.7 02/16/2018   No results for input(s): APTT, INR, PTT in the last 168 hours. Assessment/Plan: Ms. Ellingsen is a 84 y/o Caucasian female with a PMH of atrial fibrillation on Eliquis, hypertension, and dementia admitted to the hospital after unwitnessed mechanical fall, found to have UTI with hemoglobin 8.7, guaiac positive  1. Symptomatic anemia, likely secondary to GI bleed: 2. Atrial fibrillation with rapid ventricular response - Hemoglobin currently 8.7 (12.1 five weeks ago), most likely etiology is GI bleed with differential consisting of colon polyp, diverticular, malignancy, anal outlet, PUD, gastritis - Hold all anticoagulation - Continue PPI therapy - Pt currently in RVR on amiodarone drip. Recommend cardiology consult. Pt's cardiologist is Dr. Juliann Pares.  - We will  hold off on procedures until pt is clear from a cardiology standpoint - Transfuse for hemoglobin <7 - Recheck hemoglobin in the morning and monitor closely - Consider EGD/Colonoscopy when clinically appropriate   Thank you for the consult. Please call with questions or concerns.  Gilda Crease, PA-C [facility]

## 2018-03-25 NOTE — Progress Notes (Signed)
This RN spoke with Dr. Marjie SkiffSridharan regarding admission.  Per MD, pt needs telemetry bed, not med/surg.  MD feels that it is unsafe to have pt admitted to this floor and pt should be held in ER until telemetry bed available or patient's heart rate stabilized.  Darlyn ChamberAlecia Rawlins, RN [facility] notified, Lucy Antiguaeresa Coble, RN Charge notified and Aliene Altesole Amoriello, RN in ER notified of this change.  No further changes to plan of care at this time.

## 2018-03-25 NOTE — Plan of Care (Signed)
  Problem: Education: Goal: Knowledge of General Education information will improve Description: Including pain rating scale, medication(s)/side effects and non-pharmacologic comfort measures Outcome: Progressing   Problem: Health Behavior/Discharge Planning: Goal: Ability to manage health-related needs will improve Outcome: Progressing   Problem: Clinical Measurements: Goal: Ability to maintain clinical measurements within normal limits will improve Outcome: Progressing Goal: Will remain free from infection Outcome: Progressing Goal: Diagnostic test results will improve Outcome: Progressing Goal: Respiratory complications will improve Outcome: Progressing Goal: Cardiovascular complication will be avoided Outcome: Progressing   Problem: Activity: Goal: Risk for activity intolerance will decrease Outcome: Progressing   Problem: Nutrition: Goal: Adequate nutrition will be maintained Outcome: Progressing   Problem: Coping: Goal: Level of anxiety will decrease Outcome: Progressing   Problem: Elimination: Goal: Will not experience complications related to bowel motility Outcome: Progressing Goal: Will not experience complications related to urinary retention Outcome: Progressing   Problem: Pain Managment: Goal: General experience of comfort will improve Outcome: Progressing   Problem: Safety: Goal: Ability to remain free from injury will improve Outcome: Progressing   Problem: Skin Integrity: Goal: Risk for impaired skin integrity will decrease Outcome: Progressing   Problem: Education: Goal: Knowledge of disease or condition will improve Outcome: Progressing Goal: Understanding of medication regimen will improve Outcome: Progressing Goal: Individualized Educational Video(s) Outcome: Progressing   Problem: Activity: Goal: Ability to tolerate increased activity will improve Outcome: Progressing   Problem: Cardiac: Goal: Ability to achieve and maintain  adequate cardiopulmonary perfusion will improve Outcome: Progressing   Problem: Health Behavior/Discharge Planning: Goal: Ability to safely manage health-related needs after discharge will improve Outcome: Progressing   Problem: Education: Goal: Ability to identify signs and symptoms of gastrointestinal bleeding will improve Outcome: Progressing   Problem: Bowel/Gastric: Goal: Will show no signs and symptoms of gastrointestinal bleeding Outcome: Progressing   Problem: Fluid Volume: Goal: Will show no signs and symptoms of excessive bleeding Outcome: Progressing   Problem: Clinical Measurements: Goal: Complications related to the disease process, condition or treatment will be avoided or minimized Outcome: Progressing   

## 2018-03-25 NOTE — Progress Notes (Signed)
Patient ID: Paula Thomas, female   DOB: December 25, 1931, 84 y.o.   MRN: [id number]  [facility] PROGRESS NOTE  Paula Thomas [id number] DOB: December 25, 1931 DOA: 03/24/2018 PCP: [facility]  HPI/Subjective: Patient was admitted after midnight so is very tired.  Falls asleep very easily.  She does answer few questions but is lethargic.  I tried to call family but unable to reach.  Patient is not the best historian.  Objective: Vitals:   03/25/18 1501 03/25/18 1517  BP: (!) 142/83 130/79  Pulse: 85 86  Resp:    Temp:    SpO2: 92% (!) 89%    Filed Weights   03/24/18 2026 03/25/18 0629  Weight: 54.4 kg (120 lb) 47 kg (103 lb 9.6 oz)    ROS: Review of Systems  Unable to perform ROS: Acuity of condition  Respiratory: Negative for shortness of breath.   Cardiovascular: Negative for chest pain.  Gastrointestinal: Negative for abdominal pain.   Exam: Physical Exam  Constitutional: She appears lethargic.  HENT:  Nose: No mucosal edema.  Mouth/Throat: No oropharyngeal exudate or posterior oropharyngeal edema.  Eyes: Pupils are equal, round, and reactive to light. Conjunctivae and lids are normal.  Neck: No JVD present. Carotid bruit is not present. No edema present. No thyroid mass and no thyromegaly present.  Cardiovascular: S1 normal, S2 normal and normal heart sounds. An irregularly irregular rhythm present. Exam reveals no gallop.  No murmur heard. Pulses:      Dorsalis pedis pulses are 2+ on the right side, and 2+ on the left side.  Respiratory: No respiratory distress. She has decreased breath sounds in the right lower field and the left lower field. She has no wheezes. She has no rhonchi. She has no rales.  GI: Soft. Bowel sounds are normal. There is no tenderness.  Musculoskeletal:       Right ankle: She exhibits no swelling.       Left ankle: She exhibits no swelling.  Lymphadenopathy:    She has no cervical adenopathy.  Neurological: She appears  lethargic.  Skin: Skin is warm. Nails show no clubbing.  Lower extremities and Unna boots.  Psychiatric: She has a normal mood and affect.      Data Reviewed: Basic Metabolic Panel: Recent Labs  Lab 03/24/18 2033 03/25/18 0651 03/25/18 1349  NA 139 139 138  K 3.7 4.0 3.8  CL 103 109 107  CO2 27 23 23   GLUCOSE 105* 126* 112*  BUN 21 20 19   CREATININE 0.86 0.71 0.76  CALCIUM 9.1 8.2* 8.1*   CBC: Recent Labs  Lab 03/24/18 2033 03/25/18 0651  WBC 14.3* 13.3*  NEUTROABS 12.0*  --   HGB 8.9* 8.7*  HCT 27.6* 26.3*  MCV 83.8 83.9  PLT 570* 557*   BNP (last 3 results) Recent Labs    02/16/18 0242  BNP 425.0*    CBG: Recent Labs  Lab 03/25/18 0824  GLUCAP 130*      Studies: Ct Head Wo Contrast  Result Date: 03/24/2018 CLINICAL DATA:  84 year old with head trauma.  Fall. EXAM: CT HEAD WITHOUT CONTRAST TECHNIQUE: Contiguous axial images were obtained from the base of the skull through the vertex without intravenous contrast. COMPARISON:  02/10/2018 FINDINGS: Brain: Stable cerebral atrophy with diffuse low-density throughout the periventricular and subcortical white matter suggesting chronic changes. No evidence for acute hemorrhage, mass lesion, midline shift, hydrocephalus or large infarct. Stable linear low-density area in the left cerebellar hemisphere may represent an old infarct. Vascular:  No hyperdense vessel or unexpected calcification. Skull: Normal. Negative for fracture or focal lesion. Sinuses/Orbits: No acute finding. Other: None. IMPRESSION: No acute intracranial abnormality. Atrophy and evidence for chronic small vessel ischemic disease. Electronically Signed   By: Richarda Overlie M.D.   On: 03/24/2018 21:35    Scheduled Meds: . acidophilus  1 capsule Oral Daily  . clindamycin  300 mg Oral Q8H  . diltiazem  240 mg Oral Daily  . docusate sodium  100 mg Oral BID  . fosfomycin  3 g Oral Q3 days  . losartan  50 mg Oral Daily  . Melatonin  2.5 mg Oral QHS  .  memantine  10 mg Oral BID  . mirtazapine  7.5 mg Oral QHS  . multivitamin with minerals  1 tablet Oral Daily  . pantoprazole (PROTONIX) IV  40 mg Intravenous Q12H  . potassium chloride  10 mEq Oral Daily  . sertraline  12.5 mg Oral Q1200   Continuous Infusions: . amiodarone 30 mg/hr (03/25/18 1307)    Assessment/Plan:  1. Acute encephalopathy, could be from partially treated urinary tract infection.  Pharmacist reviewed the urine culture results and multiple organisms.  With the patient's drug allergies we will try clindamycin and fosfomycin to cover all the organisms. 2. Atrial fibrillation with rapid ventricular response.  Patient on Cardizem CD.  Was started on amiodarone drip.  Case discussed with cardiology to evaluate.  Last years echocardiogram showed a normal ejection fraction. 3. Acute on chronic anemia.  Fecal occult blood positive.  Placed on IV PPI.  Eliquis held.  Continue to monitor hemoglobin closely.  Send off iron studies. 4. Essential hypertension on Cardizem and losartan 5. History of dementia on Remeron and melatonin and Zoloft 6. Physical therapy evaluation for tomorrow  Code Status:     Code Status Orders  (From admission, onward)        Start     Ordered   03/25/18 0633  Do not attempt resuscitation (DNR)  Continuous    Question Answer Comment  In the event of cardiac or respiratory ARREST Do not call a "code blue"   In the event of cardiac or respiratory ARREST Do not perform Intubation, CPR, defibrillation or ACLS   In the event of cardiac or respiratory ARREST Use medication by any route, position, wound care, and other measures to relive pain and suffering. May use oxygen, suction and manual treatment of airway obstruction as needed for comfort.      03/25/18 0632    Code Status History    Date Active Date Inactive Code Status Order ID Comments User Context   06/02/2017 1719 06/06/2017 1837 DNR [id number]  Adrian Saran, MD Inpatient   04/27/2017 1735  04/28/2017 2041 Full Code [id number]  Auburn Bilberry, MD Inpatient    Advance Directive Documentation     Most Recent Value  Type of Advance Directive  Out of facility DNR (pink MOST or yellow form), Healthcare Power of Attorney  Pre-existing out of facility DNR order (yellow form or pink MOST form)  -  "MOST" Form in Place?  -     Family Communication: Left message for son to be determined Disposition Plan: To be determined  Consultants:  Cardiology  Gastroenterology  Time spent: 35 minutes  Pritika Alvarez [employer]

## 2018-03-25 NOTE — Care Management (Signed)
[facility]. Informed CSW.  She is [facility].  Has dementia. Admitted with gi bleed. Hemoglobin is 8.7. Received dose of IV lopressor in the ED for tachycarida

## 2018-03-26 LAB — URINE CULTURE
Culture: NO GROWTH
Special Requests: NORMAL

## 2018-03-26 LAB — GLUCOSE, CAPILLARY: GLUCOSE-CAPILLARY: 100 mg/dL — AB (ref 70–99)

## 2018-03-26 LAB — HEMOGLOBIN: Hemoglobin: 8.4 g/dL — ABNORMAL LOW (ref 12.0–16.0)

## 2018-03-26 MED ORDER — ENSURE ENLIVE PO LIQD
237.0000 mL | Freq: Two times a day (BID) | ORAL | Status: DC
  Administered 2018-03-26 – 2018-03-27 (×3): 237 mL via ORAL

## 2018-03-26 NOTE — Clinical Social Work Note (Signed)
Clinical Social Work Assessment  Patient Details  Name: Paula Thomas MRN: [id number] Date of Birth: [birth year]  Date of referral:  03/26/18               Reason for consult:  Facility Placement                Permission sought to share information with:  Family Supports, Magazine features editoracility Contact Representative Permission granted to share information::  Yes, Verbal Permission Granted  Name::     Fayne NorrieCostner,[name] Son   [phone] or Frentz,Renate Daughter   [phone]    [facility]  Relationship::     Contact Information:     Housing/Transportation Living arrangements for the past 2 months:  [facility] Source of Information:  Medical Team, Other (Comment Required)(Patient's niece was at bedside.) Patient Interpreter Needed:  None Criminal Activity/Legal Involvement Pertinent to Current Situation/Hospitalization:  No - Comment as needed Significant Relationships:  Adult Children, Other Family Members Lives with:  Facility Resident Do you feel safe going back to the place where you live?  Yes Need for family participation in patient care:  Yes (Comment)  Care giving concerns:  Patient's family did not express any concerns with her returning back to [facility].   Social Worker assessment / plan:  Patient is an 84 year old female who is alert and oriented x2.  Patient was resting, however patient's niece was at bedside, assessment completd by speaking to her.  Patient's niece stated that patient [facility] been [facility], then she [facility], and then a couple of weeks ago [facility].  According to patient's niece the family are trying to get patient back to the regular ALF, however patient's needs have increased, and memory care may be more appropriate.  CSW explained role of CSW and process of coordinating with ALF to have patient return back to ALF.   Patient's family did not express any other questions or concerns, patient is content on returning back to ALF.  CSW was given permission to [facility] to coordinate [facility].  Employment status:  Retired Database administratornsurance information:  Managed Medicare PT Recommendations:  Not assessed at this time Information / Referral to community resources:     Patient/Family's Response to care:  Patient's family is in agreement to having her return [facility].  Patient/Family's Understanding of and Emotional Response to Diagnosis, Current Treatment, and Prognosis:  Patient's family are hopeful that she can return back to ALF.  Emotional Assessment Appearance:  Appears stated age Attitude/Demeanor/Rapport:    Affect (typically observed):  Appropriate Orientation:  Oriented to Self, Oriented to Place Alcohol / Substance use:  Not Applicable Psych involvement (Current and /or in the community):  No (Comment)  Discharge Needs  Concerns to be addressed:  Care Coordination Readmission within the last 30 days:  No Current discharge risk:  Cognitively Impaired Barriers to Discharge:  Continued Medical Work up   [place] [employer], Elisha Cooksey R, LCSWA 03/26/2018, 5:28 PM

## 2018-03-26 NOTE — Progress Notes (Signed)
GI Inpatient Follow-up Note  Patient Identification: Paula Thomas is a 84 y.o. female seen in evaluation of GI Bleed with hemoglobin 8.4 today with hemoccult positive stool in ED on 07/23. Pt has PMH of atrial fibrillation on Eliquis with dementia. Cardiology is following the patient bc patient was in RVR yesterday.  Patient seen and examined resting comfortably in hospital bed. Daughter was not present today in room during examination. Per nurse Ladona Ridgel, family does not wish to proceed with endoscopies at this time. No acute events overnight. Patient does not appear to be in any obvious pain or discomfort. No nausea, vomiting, abdominal pain.   Subjective:  Scheduled Inpatient Medications:  . acidophilus  1 capsule Oral Daily  . clindamycin  300 mg Oral Q8H  . diltiazem  240 mg Oral Daily  . docusate sodium  100 mg Oral BID  . fosfomycin  3 g Oral Q3 days  . losartan  50 mg Oral Daily  . Melatonin  2.5 mg Oral QHS  . memantine  10 mg Oral BID  . mirtazapine  7.5 mg Oral QHS  . multivitamin with minerals  1 tablet Oral Daily  . pantoprazole (PROTONIX) IV  40 mg Intravenous Q12H  . potassium chloride  10 mEq Oral Daily  . sertraline  12.5 mg Oral Q1200    Continuous Inpatient Infusions:   . amiodarone Stopped (03/26/18 1015)    PRN Inpatient Medications:  acetaminophen **OR** acetaminophen, bisacodyl, HYDROcodone-acetaminophen, ondansetron **OR** ondansetron (ZOFRAN) IV, polyethylene glycol, traMADol, traZODone  Review of Systems: Constitutional: Weight is stable.  Eyes: No changes in vision. ENT: No oral lesions, sore throat.  GI: see HPI.  Heme/Lymph: No easy bruising.  CV: No chest pain.  GU: No hematuria.  Integumentary: No rashes.  Neuro: No headaches.  Psych: No depression/anxiety.  Endocrine: No heat/cold intolerance.  Allergic/Immunologic: No urticaria.  Resp: No cough, SOB.  Musculoskeletal: No joint swelling.    Physical Examination: BP 134/81 (BP  Location: Right Arm)   Pulse 87   Temp 98.5 F (36.9 C) (Oral)   Resp 17   Ht 5\' 6"  (1.676 m)   Wt 49.4 kg (108 lb 12.8 oz)   LMP  (LMP Unknown)   SpO2 93%   BMI 17.56 kg/m  Somnolent, no acute distress. Unable to answer all questions.  Gen: NAD, alert and oriented x 4 HEENT: PEERLA, EOMI, Neck: supple, no JVD or thyromegaly Chest: CTA bilaterally, no wheezes, crackles, or other adventitious sounds CV: RRR, no m/g/c/r Abd: soft, NT, ND, +BS in all four quadrants; no HSM, guarding, ridigity, or rebound tenderness Ext: no edema, well perfused with 2+ pulses, Skin: no rash or lesions noted Lymph: no LAD  Data: Lab Results  Component Value Date   WBC 13.3 (H) 03/25/2018   HGB 8.4 (L) 03/26/2018   HCT 26.3 (L) 03/25/2018   MCV 83.9 03/25/2018   PLT 557 (H) 03/25/2018   Recent Labs  Lab 03/24/18 2033 03/25/18 0651 03/26/18 0345  HGB 8.9* 8.7* 8.4*   Lab Results  Component Value Date   NA 138 03/25/2018   K 3.8 03/25/2018   CL 107 03/25/2018   CO2 23 03/25/2018   BUN 19 03/25/2018   CREATININE 0.76 03/25/2018   Lab Results  Component Value Date   ALT 30 02/16/2018   AST 32 02/16/2018   ALKPHOS 110 02/16/2018   BILITOT 0.7 02/16/2018   No results for input(s): APTT, INR, PTT in the last 168 hours. Assessment/Plan: Ms. Brahmbhatt is  a 84 y/o Caucasian female with a PMH of atrial fibrillation on Eliquis, hypertension, and dementia admitted to the hospital after unwitnessed mechanical fall, found to have UTI with hemoglobin 8.7, guaiac positive  1. GI Bleed: - Pt's daughter Paula Thomas was not in room during exam. I gave her a call and we discussed plan of care. They would like to focus on cardiology issues at this time without proceeding with procedures. Renate expressed the concern the last time her mother had anesthesia this worsened her dementia. - Hemoglobin decreased to 8.4 today (8.7 yesterday) - Pt and her family do not wish to proceed with EGD or colonoscopy at this  time - Continue to hold anticoagulation - Cardiology following patient for atrial fibrillation with RVR - Continue to monitor hemoglobin. Transfuse for hemoglobin <7 - We will continue to follow for any future drop in hemoglobin, gross hematochezia, or melena   Please call with questions or concerns. Thank you for appreciating our input.    Jacob MooresMason Croley, PA-C [facility]  [phone]

## 2018-03-26 NOTE — Progress Notes (Signed)
Initial Nutrition Assessment  DOCUMENTATION CODES:   Not applicable  INTERVENTION:   Ensure Enlive po BID, each supplement provides 350 kcal and 20 grams of protein  Magic cup TID with meals, each supplement provides 290 kcal and 9 grams of protein  MVI daily  Liberalize diet  NUTRITION DIAGNOSIS:   Inadequate oral intake related to acute illness as evidenced by meal completion < 25%.  GOAL:   Patient will meet greater than or equal to 90% of their needs  MONITOR:   PO intake, Supplement acceptance, Labs, Weight trends, Skin, I & O's  REASON FOR ASSESSMENT:   Other (Comment)(low BMI)    ASSESSMENT:   84 y/o Caucasian female with a PMH of atrial fibrillation on Eliquis, hypertension, and dementia admitted to the hospital after unwitnessed mechanical fall, found to have UTI with hemoglobin 8.7, guaiac positive   Visited pt's room today. Unable to obtain nutrition related history as pt with dementia and no family at bedside. Per chart, pt appears stable pta. Pt with severe muscle depletions in her BLE; per chart, pt is wheelchair bound at baseline. RD will liberalize pt's diet and add supplements to help pt meet her estimated needs. No intakes documented for today. Pt's family declines GI work-up per MD note.  Medications reviewed and include: risaquad, clindamycin, colace, melatonin  Labs reviewed: Hgb 8.4(L) Hct 26.3(L)- 7/24  NUTRITION - FOCUSED PHYSICAL EXAM:    Most Recent Value  Orbital Region  No depletion  Upper Arm Region  Mild depletion  Thoracic and Lumbar Region  Mild depletion  Buccal Region  No depletion  Temple Region  No depletion  Clavicle Bone Region  Mild depletion  Clavicle and Acromion Bone Region  Mild depletion  Scapular Bone Region  Mild depletion  Dorsal Hand  Moderate depletion  Patellar Region  Severe depletion  Anterior Thigh Region  Severe depletion  Posterior Calf Region  Severe depletion  Edema (RD Assessment)  Mild  Hair   Reviewed  Eyes  Reviewed  Mouth  Reviewed  Skin  Reviewed  Nails  Reviewed     Diet Order:   Diet Order           Diet regular Room service appropriate? Yes; Fluid consistency: Thin  Diet effective now         EDUCATION NEEDS:   Not appropriate for education at this time  Skin:  Skin Assessment: (venous insufficiency)  Last BM:  7/24- type 6  Height:   Ht Readings from Last 1 Encounters:  03/24/18 5\' 6"  (1.676 m)    Weight:   Wt Readings from Last 1 Encounters:  03/26/18 108 lb 12.8 oz (49.4 kg)    Ideal Body Weight:  59 kg  BMI:  Body mass index is 17.56 kg/m.  Estimated Nutritional Needs:   Kcal:  1300-1500kcal/day   Protein:  54-64g/day   Fluid:  >1.3L/day   Betsey Holidayasey Charmin Aguiniga MS, RD, LDN Pager #- [phone] Office#- [phone] After Hours Pager: [phone]

## 2018-03-26 NOTE — Progress Notes (Signed)
PT Cancellation Note  Patient Details Name: [name] MRN: [id number] DOB: 02-11-1932   Cancelled Treatment:    Reason Eval/Treat Not Completed: Patient not medically ready.  PT consult received.  Chart reviewed.  On telemetry monitor outside pt's room, pt's HR noted to be fluctuating between 88 to 122 bpm (pt noted to be resting in bed).  Per chart pt started on amiodarone drip this morning.  Nurse reporting that pt's HR has consistently gone up when getting changed (in bed) today.  Per discussion with nurse, will hold PT at this time (d/t elevated HR concerns with any activity); will re-attempt PT evaluation tomorrow as medically appropriate.  Hendricks LimesEmily Loie Jahr, PT 03/26/18, 3:32 PM [phone]

## 2018-03-26 NOTE — Consult Note (Signed)
Reason for Consult: Atrial fibrillation Referring Physician: [facility] calls primary Dr. Duane Boston hospitalist Cardiologist [facility] Paula Thomas is an 84 y.o. female.  HPI: Patient has a history of atrial fibrillation in the past requiring Eliquis anticoagulation patient had rate control with Cardizem blood pressure control with Cozaar she now is a a skilled nursing facility because of severe dementia patient was found to have some rectal bleeding so was admitted for further assessment and evaluation patient is not as attentive lethargic denies any pain but with a dementia history is very very difficult.  History was partially obtained from her daughter  Past Medical History:  Diagnosis Date  . Atrial fibrillation ([place])   . Dementia due to medical condition, without behavioral disturbance   . Fatigue   . Hypertension   . Syncope   . Underweight     Past Surgical History:  Procedure Laterality Date  . ABDOMINAL HYSTERECTOMY    . EYE SURGERY Bilateral 2012   cataracts  . HERNIA REPAIR    . HIP ARTHROPLASTY Right 06/04/2017   Procedure: ARTHROPLASTY BIPOLAR HIP (HEMIARTHROPLASTY);  Surgeon: Leim Fabry, MD;  Location: [facility] ORS;  Service: Orthopedics;  Laterality: Right;  . SPLENECTOMY    . TONSILLECTOMY      Family History  Problem Relation Age of Onset  . Cancer Mother   . Cancer Father   . Heart attack Brother   . Heart attack Brother     Social History:  reports that she has quit smoking. She has never used smokeless tobacco. She reports that she does not drink alcohol or use drugs.  Allergies:  Allergies  Allergen Reactions  . Lisinopril Cough  . Penicillin G Potassium [Penicillin G]     Has patient had a PCN reaction causing immediate rash, facial/tongue/throat swelling, SOB or lightheadedness with hypotension: Unknown Has patient had a PCN reaction causing severe rash involving mucus membranes or skin necrosis: Unknown Has patient had a PCN reaction that  required hospitalization: Unknown Has patient had a PCN reaction occurring within the last 10 years: Unknown If all of the above answers are "NO", then may proceed with Cephalosporin use.   . Sulfa Antibiotics     unknown    Medications: I have reviewed the patient's current medications.  Results for orders placed or performed during the hospital encounter of 03/24/18 (from the past 48 hour(s))  CBC with Differential/Platelet     Status: Abnormal   Collection Time: 03/24/18  8:33 PM  Result Value Ref Range   WBC 14.3 (H) 3.6 - 11.0 K/uL   RBC 3.29 (L) 3.80 - 5.20 MIL/uL   Hemoglobin 8.9 (L) 12.0 - 16.0 g/dL   HCT 27.6 (L) 35.0 - 47.0 %   MCV 83.8 80.0 - 100.0 fL   MCH 27.2 26.0 - 34.0 pg   MCHC 32.4 32.0 - 36.0 g/dL   RDW 15.9 (H) 11.5 - 14.5 %   Platelets 570 (H) 150 - 440 K/uL   Neutrophils Relative % 84 %   Neutro Abs 12.0 (H) 1.4 - 6.5 K/uL   Lymphocytes Relative 7 %   Lymphs Abs 1.0 1.0 - 3.6 K/uL   Monocytes Relative 8 %   Monocytes Absolute 1.1 (H) 0.2 - 0.9 K/uL   Eosinophils Relative 0 %   Eosinophils Absolute 0.0 0 - 0.7 K/uL   Basophils Relative 1 %   Basophils Absolute 0.1 0 - 0.1 K/uL    Comment: Performed at [facility], [address] [place], [place] [address]  Basic metabolic panel     Status: Abnormal   Collection Time: 03/24/18  8:33 PM  Result Value Ref Range   Sodium 139 135 - 145 mmol/L   Potassium 3.7 3.5 - 5.1 mmol/L   Chloride 103 98 - 111 mmol/L   CO2 27 22 - 32 mmol/L   Glucose, Bld 105 (H) 70 - 99 mg/dL   BUN 21 8 - 23 mg/dL   Creatinine, Ser 0.86 0.44 - 1.00 mg/dL   Calcium 9.1 8.9 - 10.3 mg/dL   GFR calc non Af Amer >60 >60 mL/min   GFR calc Af Amer >60 >60 mL/min    Comment: (NOTE) The eGFR has been calculated using the CKD EPI equation. This calculation has not been validated in all clinical situations. eGFR's persistently <60 mL/min signify possible Chronic Kidney Disease.    Anion gap 9 5 - 15    Comment: Performed at  [facility], [address] [place], [place] [address]  Lactic acid, plasma     Status: None   Collection Time: 03/24/18 10:22 PM  Result Value Ref Range   Lactic Acid, Venous 0.9 0.5 - 1.9 mmol/L    Comment: Performed at [facility], [address] [place], [place] [address]  Type and screen     Status: None   Collection Time: 03/24/18 10:57 PM  Result Value Ref Range   ABO/RH(D) B POS    Antibody Screen NEG    Sample Expiration      03/27/2018 Performed at [facility], [address] [place], [place] [address]   Basic metabolic panel     Status: Abnormal   Collection Time: 03/25/18  6:51 AM  Result Value Ref Range   Sodium 139 135 - 145 mmol/L   Potassium 4.0 3.5 - 5.1 mmol/L   Chloride 109 98 - 111 mmol/L   CO2 23 22 - 32 mmol/L   Glucose, Bld 126 (H) 70 - 99 mg/dL   BUN 20 8 - 23 mg/dL   Creatinine, Ser 0.71 0.44 - 1.00 mg/dL   Calcium 8.2 (L) 8.9 - 10.3 mg/dL   GFR calc non Af Amer >60 >60 mL/min   GFR calc Af Amer >60 >60 mL/min    Comment: (NOTE) The eGFR has been calculated using the CKD EPI equation. This calculation has not been validated in all clinical situations. eGFR's persistently <60 mL/min signify possible Chronic Kidney Disease.    Anion gap 7 5 - 15    Comment: Performed at [facility], [address] [place], [place] [address]  CBC     Status: Abnormal   Collection Time: 03/25/18  6:51 AM  Result Value Ref Range   WBC 13.3 (H) 3.6 - 11.0 K/uL   RBC 3.14 (L) 3.80 - 5.20 MIL/uL   Hemoglobin 8.7 (L) 12.0 - 16.0 g/dL   HCT 26.3 (L) 35.0 - 47.0 %   MCV 83.9 80.0 - 100.0 fL   MCH 27.6 26.0 - 34.0 pg   MCHC 33.0 32.0 - 36.0 g/dL   RDW 16.2 (H) 11.5 - 14.5 %   Platelets 557 (H) 150 - 440 K/uL    Comment: Performed at [facility], [address] [place], [place] [address]  Glucose, capillary     Status: Abnormal   Collection Time: 03/25/18  8:24 AM  Result Value Ref Range   Glucose-Capillary 130  (H) 70 - 99 mg/dL  Urinalysis, Complete w Microscopic  Status: Abnormal   Collection Time: 03/25/18 11:21 AM  Result Value Ref Range   Color, Urine YELLOW (A) YELLOW   APPearance CLEAR (A) CLEAR   Specific Gravity, Urine 1.017 1.005 - 1.030   pH 5.0 5.0 - 8.0   Glucose, UA NEGATIVE NEGATIVE mg/dL   Hgb urine dipstick NEGATIVE NEGATIVE   Bilirubin Urine NEGATIVE NEGATIVE   Ketones, ur NEGATIVE NEGATIVE mg/dL   Protein, ur NEGATIVE NEGATIVE mg/dL   Nitrite NEGATIVE NEGATIVE   Leukocytes, UA NEGATIVE NEGATIVE   RBC / HPF 0-5 0 - 5 RBC/hpf   WBC, UA NONE SEEN 0 - 5 WBC/hpf   Bacteria, UA NONE SEEN NONE SEEN   Squamous Epithelial / LPF NONE SEEN 0 - 5    Comment: Performed at [facility], [address], [place], [place] [address]  Urine Culture     Status: None   Collection Time: 03/25/18 11:21 AM  Result Value Ref Range   Specimen Description      URINE, RANDOM Performed at [facility], [address] [place], [place] [address]    Special Requests      Normal Performed at [facility], [address] [place], [place] [address]    Culture      NO GROWTH Performed at [facility], [address], [place], [place] [address]    Report Status 03/26/2018 FINAL   Basic metabolic panel     Status: Abnormal   Collection Time: 03/25/18  1:49 PM  Result Value Ref Range   Sodium 138 135 - 145 mmol/L   Potassium 3.8 3.5 - 5.1 mmol/L   Chloride 107 98 - 111 mmol/L   CO2 23 22 - 32 mmol/L   Glucose, Bld 112 (H) 70 - 99 mg/dL   BUN 19 8 - 23 mg/dL   Creatinine, Ser 0.76 0.44 - 1.00 mg/dL   Calcium 8.1 (L) 8.9 - 10.3 mg/dL   GFR calc non Af Amer >60 >60 mL/min   GFR calc Af Amer >60 >60 mL/min    Comment: (NOTE) The eGFR has been calculated using the CKD EPI equation. This calculation has not been validated in all clinical situations. eGFR's persistently <60 mL/min signify possible Chronic Kidney Disease.    Anion gap 8 5 - 15    Comment:  Performed at [facility], [address] [place], [place] [address]  Ferritin     Status: None   Collection Time: 03/25/18  1:49 PM  Result Value Ref Range   Ferritin 60 11 - 307 ng/mL    Comment: Performed at [facility], [address] [place], [place] [address]  Hemoglobin     Status: Abnormal   Collection Time: 03/26/18  3:45 AM  Result Value Ref Range   Hemoglobin 8.4 (L) 12.0 - 16.0 g/dL    Comment: Performed at [facility], [address] [place],  [address]  Glucose, capillary     Status: Abnormal   Collection Time: 03/26/18  7:52 AM  Result Value Ref Range   Glucose-Capillary 100 (H) 70 - 99 mg/dL    Ct Head Wo Contrast  Result Date: 03/24/2018 CLINICAL DATA:  84 year old with head trauma.  Fall. EXAM: CT HEAD WITHOUT CONTRAST TECHNIQUE: Contiguous axial images were obtained from the base of the skull through the vertex without intravenous contrast. COMPARISON:  02/10/2018 FINDINGS: Brain: Stable cerebral atrophy with diffuse low-density throughout the periventricular and subcortical white matter suggesting chronic changes. No evidence  for acute hemorrhage, mass lesion, midline shift, hydrocephalus or large infarct. Stable linear low-density area in the left cerebellar hemisphere may represent an old infarct. Vascular: No hyperdense vessel or unexpected calcification. Skull: Normal. Negative for fracture or focal lesion. Sinuses/Orbits: No acute finding. Other: None. IMPRESSION: No acute intracranial abnormality. Atrophy and evidence for chronic small vessel ischemic disease. Electronically Signed   By: Markus Daft M.D.   On: 03/24/2018 21:35    Review of Systems  Unable to perform ROS: Dementia   Blood pressure 134/81, pulse 87, temperature 98.5 F (36.9 C), temperature source Oral, resp. rate 17, height 5' 6"  (1.676 m), weight 108 lb 12.8 oz (49.4 kg), SpO2 93 %. Physical Exam  Nursing note and vitals reviewed. Constitutional: She is  oriented to person, place, and time. She appears well-developed and well-nourished.  HENT:  Head: Atraumatic.  Eyes: Pupils are equal, round, and reactive to light. Conjunctivae and EOM are normal.  Neck: Normal range of motion. Neck supple.  Cardiovascular: Normal rate and normal pulses. An irregularly irregular rhythm present.  Murmur heard. GI: Soft. Bowel sounds are normal.  Musculoskeletal: Normal range of motion.  Neurological: She is alert and oriented to person, place, and time. She has normal reflexes.  Skin: Skin is warm and dry.  Psychiatric: Her affect is inappropriate. Her speech is delayed. She is slowed. She expresses inappropriate judgment. She is inattentive.    Assessment/Plan:  Mild lower GI bleeding  atrial fibrillation Dementia Generalized fatigue Hypertension History of syncope . Plan Agree with admit to telemetry because of atrial fibrillation Continue to hold Eliquis because of GI bleeding Consider GI evaluation because of rectal bleeding Limit aggressive therapy because of underlying dementia Do not recommend restarting Eliquis because of her dementia atrial fibrillation     Dwayne D Callwood 03/26/2018, 11:44 AM

## 2018-03-26 NOTE — Consult Note (Signed)
WOC Nurse wound consult note Reason for Consult: Patient admitted wearing bilateral Unna's Boots.  These are replaced twice weekly at her ALF. Daughter reports that patient failed "trial" of boot removal with the return of edema. No wounds present today. Patient's daughter reports fragile skin. Wound type: venous insufficiency Pressure Injury POA: NA Measurement:N/A Wound bed:N/A Drainage (amount, consistency, odor) None Periwound:N/A Dressing procedure/placement/frequency: Bilateral Unna's Boots removed, LEs washed and dried.  Boots replaced. Will decrease frequency of changes to once weekly on Thursdays.  Have ordered bilateral pressure redistribution heel boots for prevention of heel pressure injuries, a sacral prophylactic foam dressing for the prevention of sacral PrI.  Patient will still need to be turned and repositioned per house protocol.  WOC nursing team will follow for weekly Unna's Boot changes on Thursdays while in house and will remain available to this patient, the nursing and medical teams.  Please re-consult if needed. Thanks, Ladona MowLaurie Allayah Raineri, MSN, RN, GNP, Hans EdenCWOCN, CWON-AP, FAAN  Pager# [phone]

## 2018-03-26 NOTE — Progress Notes (Signed)
Patient ID: Paula Thomas, female   DOB: April 18, 1932, 84 y.o.   MRN: [id number]  [employer] PROGRESS NOTE  Paula Thomas WUJ:[id number] DOB: February 03, 1932 DOA: 03/24/2018 PCP: [facility]  HPI/Subjective: Patient is demented.   On oxygen by nasal cannula 2 L.  On amiodarone drip. Objective: Vitals:   03/26/18 1245 03/26/18 1601  BP: (!) 170/91 111/78  Pulse: (!) 113 98  Resp:    Temp:    SpO2:      Filed Weights   03/24/18 2026 03/25/18 0629 03/26/18 0616  Weight: 120 lb (54.4 kg) 103 lb 9.6 oz (47 kg) 108 lb 12.8 oz (49.4 kg)    ROS: Review of Systems  Unable to perform ROS: Dementia   Exam: Physical Exam  Constitutional: She appears lethargic.  HENT:  Nose: No mucosal edema.  Mouth/Throat: No oropharyngeal exudate or posterior oropharyngeal edema.  Eyes: Pupils are equal, round, and reactive to light. Conjunctivae and lids are normal.  Neck: No JVD present. Carotid bruit is not present. No edema present. No thyroid mass and no thyromegaly present.  Cardiovascular: S1 normal, S2 normal and normal heart sounds. An irregularly irregular rhythm present. Exam reveals no gallop.  No murmur heard. Pulses:      Dorsalis pedis pulses are 2+ on the right side, and 2+ on the left side.  Respiratory: No respiratory distress. She has decreased breath sounds in the right lower field and the left lower field. She has no wheezes. She has no rhonchi. She has no rales.  GI: Soft. Bowel sounds are normal. There is no tenderness.  Musculoskeletal:       Right ankle: She exhibits no swelling.       Left ankle: She exhibits no swelling.  Lymphadenopathy:    She has no cervical adenopathy.  Neurological: She appears lethargic.  Skin: Skin is warm. Nails show no clubbing.  Lower extremities and Unna boots.  Psychiatric: She has a normal mood and affect.      Data Reviewed: Basic Metabolic Panel: Recent Labs  Lab 03/24/18 2033 03/25/18 0651 03/25/18 1349   NA 139 139 138  K 3.7 4.0 3.8  CL 103 109 107  CO2 27 23 23   GLUCOSE 105* 126* 112*  BUN 21 20 19   CREATININE 0.86 0.71 0.76  CALCIUM 9.1 8.2* 8.1*   CBC: Recent Labs  Lab 03/24/18 2033 03/25/18 0651 03/26/18 0345  WBC 14.3* 13.3*  --   NEUTROABS 12.0*  --   --   HGB 8.9* 8.7* 8.4*  HCT 27.6* 26.3*  --   MCV 83.8 83.9  --   PLT 570* 557*  --    BNP (last 3 results) Recent Labs    02/16/18 0242  BNP 425.0*    CBG: Recent Labs  Lab 03/25/18 0824 03/26/18 0752  GLUCAP 130* 100*      Studies: Ct Head Wo Contrast  Result Date: 03/24/2018 CLINICAL DATA:  84 year old with head trauma.  Fall. EXAM: CT HEAD WITHOUT CONTRAST TECHNIQUE: Contiguous axial images were obtained from the base of the skull through the vertex without intravenous contrast. COMPARISON:  02/10/2018 FINDINGS: Brain: Stable cerebral atrophy with diffuse low-density throughout the periventricular and subcortical white matter suggesting chronic changes. No evidence for acute hemorrhage, mass lesion, midline shift, hydrocephalus or large infarct. Stable linear low-density area in the left cerebellar hemisphere may represent an old infarct. Vascular: No hyperdense vessel or unexpected calcification. Skull: Normal. Negative for fracture or focal lesion. Sinuses/Orbits: No acute finding.  Other: None. IMPRESSION: No acute intracranial abnormality. Atrophy and evidence for chronic small vessel ischemic disease. Electronically Signed   By: Richarda OverlieAdam  Henn M.D.   On: 03/24/2018 21:35    Scheduled Meds: . acidophilus  1 capsule Oral Daily  . clindamycin  300 mg Oral Q8H  . diltiazem  240 mg Oral Daily  . docusate sodium  100 mg Oral BID  . feeding supplement (ENSURE ENLIVE)  237 mL Oral BID BM  . fosfomycin  3 g Oral Q3 days  . losartan  50 mg Oral Daily  . Melatonin  2.5 mg Oral QHS  . memantine  10 mg Oral BID  . mirtazapine  7.5 mg Oral QHS  . multivitamin with minerals  1 tablet Oral Daily  . pantoprazole  (PROTONIX) IV  40 mg Intravenous Q12H  . potassium chloride  10 mEq Oral Daily  . sertraline  12.5 mg Oral Q1200   Continuous Infusions: . amiodarone 30 mg/hr (03/26/18 1246)    Assessment/Plan:  1. Acute metabolic encephalopathy, could be from partially treated urinary tract infection.  Pharmacist reviewed the urine culture results and multiple organisms.  With the patient's drug allergies, she is put on clindamycin and fosfomycin to cover all the organisms.  Repeat urinalysis and urine culture does not show UTI. 2. Atrial fibrillation with rapid ventricular response.  On amiodarone drip and Cardizem.  Last years echocardiogram showed a normal ejection fraction. Continue to hold Eliquis because of GI bleeding and Do not recommend restarting Eliquis because of her dementia atrial fibrillation per Dr. Juliann Paresallwood.  2. Acute on chronic anemia due to occult GIB.  Fecal occult blood positive.  Placed on PPI.  Eliquis held. Hb down to 8.4. Continue to monitor hemoglobin.  Pt and her family do not wish to proceed with EGD or colonoscopy at this time.  3. Essential hypertension on Cardizem and losartan 4. History of dementia on Remeron and melatonin and Zoloft Physical therapy evaluation:  Code Status:     Code Status Orders  (From admission, onward)        Start     Ordered   03/25/18 0633  Do not attempt resuscitation (DNR)  Continuous    Question Answer Comment  In the event of cardiac or respiratory ARREST Do not call a "code blue"   In the event of cardiac or respiratory ARREST Do not perform Intubation, CPR, defibrillation or ACLS   In the event of cardiac or respiratory ARREST Use medication by any route, position, wound care, and other measures to relive pain and suffering. May use oxygen, suction and manual treatment of airway obstruction as needed for comfort.      03/25/18 0632    Code Status History    Date Active Date Inactive Code Status Order ID Comments User Context    06/02/2017 1719 06/06/2017 1837 DNR [id number]  Adrian SaranMody, Sital, MD Inpatient   04/27/2017 1735 04/28/2017 2041 Full Code [id number]  Auburn BilberryPatel, Shreyang, MD Inpatient    Advance Directive Documentation     Most Recent Value  Type of Advance Directive  Out of facility DNR (pink MOST or yellow form), Healthcare Power of Attorney  Pre-existing out of facility DNR order (yellow form or pink MOST form)  -  "MOST" Form in Place?  -     Family Communication: Left message for son to be determined Disposition Plan: To be determined  Consultants:  Cardiology  Gastroenterology  Time spent: 35 minutes  Shaune PollackQing Xaviar Lunn  [employer]

## 2018-03-27 LAB — GLUCOSE, CAPILLARY: Glucose-Capillary: 91 mg/dL (ref 70–99)

## 2018-03-27 LAB — HEMOGLOBIN: Hemoglobin: 8.7 g/dL — ABNORMAL LOW (ref 12.0–16.0)

## 2018-03-27 MED ORDER — AMIODARONE HCL 200 MG PO TABS: 200.0000 mg | ORAL_TABLET | Freq: Two times a day (BID) | ORAL | Status: DC

## 2018-03-27 MED ORDER — AMIODARONE HCL IN DEXTROSE 360-4.14 MG/200ML-% IV SOLN: 30.0000 mg/h | INTRAVENOUS | Status: DC

## 2018-03-27 MED ORDER — AMIODARONE HCL IN DEXTROSE 360-4.14 MG/200ML-% IV SOLN
30.0000 mg/h | INTRAVENOUS | Status: DC
  Administered 2018-03-27 – 2018-03-28 (×2): 30 mg/h via INTRAVENOUS
  Administered 2018-03-28 – 2018-03-30 (×7): 60 mg/h via INTRAVENOUS
  Administered 2018-03-30: 30 mg/h via INTRAVENOUS
  Filled 2018-03-27 (×10): qty 200

## 2018-03-27 MED ORDER — KETOROLAC TROMETHAMINE 30 MG/ML IJ SOLN
15.0000 mg | Freq: Four times a day (QID) | INTRAMUSCULAR | Status: DC | PRN
  Administered 2018-03-27 – 2018-03-30 (×4): 15 mg via INTRAVENOUS
  Filled 2018-03-27 (×4): qty 1

## 2018-03-27 NOTE — Progress Notes (Signed)
PT Cancellation Note  Patient Details Name: Paula Thomas MRN: [id number] DOB: [birth year]   Cancelled Treatment:    Reason Eval/Treat Not Completed: Patient not medically ready.  PT consult received.  Chart reviewed.  On telemetry monitor outside pt's room, pt's HR noted to be fluctuating between 102-140 bpm (discussed with pt's nurse who reports pt was taken off amiodarone drip this morning and pt did not take oral form of HR controlling medication today d/t difficulty swallowing).  Per discussion with nurse (d/t HR concerns), will hold PT at this time and re-attempt PT evaluation at a later date/time as medically appropriate.  Hendricks LimesEmily Emonte Dieujuste, PT 03/27/18, 3:32 PM [phone]

## 2018-03-27 NOTE — Progress Notes (Signed)
Patient's heart rate ranging from 104-145. Dr. Imogene Burnhen was told.  He might restart the amnio drip. RN waiting his orders.  Christean GriefShylon M Ondrea Dow, RN

## 2018-03-27 NOTE — Care Management (Signed)
Hemoglobin remains around 8.7.  Family does not wish to pursue endoscopy at present.  Current issues controlling heart rate. Physical therapy has not been able to eval due to elevated heart rate. Amiodarone drip and cardizem.

## 2018-03-27 NOTE — Progress Notes (Signed)
Patient reported severe pain all over especially arms and abdomen.  She also reported being cold.  MD added an IV pain medicine for her because she is having difficulty swallowing.  RN wrapped her in warm blankets from the warmer.  Christean GriefShylon M Martia Dalby, RN

## 2018-03-27 NOTE — Care Management Important Message (Signed)
Copy of signed IM left with patient in room.  

## 2018-03-27 NOTE — Progress Notes (Signed)
Patient refused her medicines.  I crushed them in apple sauce for her.  I explained that it contained important medicines.  She again refused.  She stated she hates apple sauce.  I asked her if I could get her jello instead.  She said no.   Patient pockets pills in her mouth, so pills must be crushed and capsules opened.  Do NOT use apple sauce for future attempts; try other things maybe ice cream.  I will try again later this shift.  Christean GriefShylon M Fable Huisman, RN

## 2018-03-27 NOTE — Progress Notes (Signed)
Patient ID: Paula Thomas, female   DOB: [birth year], 84 y.o.   MRN: [id number]  [facility] PROGRESS NOTE  Paula Thomas WUJ:[id number] DOB: [birth year] DOA: 03/24/2018 PCP: [facility]  HPI/Subjective: Patient is demented.  She complains of generalized body pain, refused to take p.o. medication and diet.   On oxygen by nasal cannula 2 L.  On amiodarone drip. Objective: Vitals:   03/27/18 0805 03/27/18 1623  BP: (!) 141/99 (!) 169/113  Pulse: 85 (!) 129  Resp: 19   Temp: (!) 97.5 F (36.4 C) 98.5 F (36.9 C)  SpO2: 95% 98%    Filed Weights   03/25/18 0629 03/26/18 0616 03/27/18 0458  Weight: 103 lb 9.6 oz (47 kg) 108 lb 12.8 oz (49.4 kg) 101 lb 9.6 oz (46.1 kg)    ROS: Review of Systems  Unable to perform ROS: Dementia   Exam: Physical Exam  Constitutional: She appears lethargic.  HENT:  Nose: No mucosal edema.  Mouth/Throat: No oropharyngeal exudate or posterior oropharyngeal edema.  Eyes: Pupils are equal, round, and reactive to light. Conjunctivae and lids are normal.  Neck: No JVD present. Carotid bruit is not present. No edema present. No thyroid mass and no thyromegaly present.  Cardiovascular: S1 normal, S2 normal and normal heart sounds. An irregularly irregular rhythm present. Exam reveals no gallop.  No murmur heard. Pulses:      Dorsalis pedis pulses are 2+ on the right side, and 2+ on the left side.  Respiratory: No respiratory distress. She has decreased breath sounds in the right lower field and the left lower field. She has no wheezes. She has no rhonchi. She has no rales.  GI: Soft. Bowel sounds are normal. There is no tenderness.  Musculoskeletal:       Right ankle: She exhibits no swelling.       Left ankle: She exhibits no swelling.  Lymphadenopathy:    She has no cervical adenopathy.  Neurological: She appears lethargic.  Skin: Skin is warm. Nails show no clubbing.  Lower extremities and Unna boots.  Psychiatric: She has  a normal mood and affect.      Data Reviewed: Basic Metabolic Panel: Recent Labs  Lab 03/24/18 2033 03/25/18 0651 03/25/18 1349  NA 139 139 138  K 3.7 4.0 3.8  CL 103 109 107  CO2 27 23 23   GLUCOSE 105* 126* 112*  BUN 21 20 19   CREATININE 0.86 0.71 0.76  CALCIUM 9.1 8.2* 8.1*   CBC: Recent Labs  Lab 03/24/18 2033 03/25/18 0651 03/26/18 0345 03/27/18 0735  WBC 14.3* 13.3*  --   --   NEUTROABS 12.0*  --   --   --   HGB 8.9* 8.7* 8.4* 8.7*  HCT 27.6* 26.3*  --   --   MCV 83.8 83.9  --   --   PLT 570* 557*  --   --    BNP (last 3 results) Recent Labs    02/16/18 0242  BNP 425.0*    CBG: Recent Labs  Lab 03/25/18 0824 03/26/18 0752 03/27/18 0807  GLUCAP 130* 100* 91      Studies: No results found.  Scheduled Meds: . acidophilus  1 capsule Oral Daily  . clindamycin  300 mg Oral Q8H  . diltiazem  240 mg Oral Daily  . docusate sodium  100 mg Oral BID  . feeding supplement (ENSURE ENLIVE)  237 mL Oral BID BM  . fosfomycin  3 g Oral Q3 days  . losartan  50 mg Oral Daily  . Melatonin  2.5 mg Oral QHS  . memantine  10 mg Oral BID  . mirtazapine  7.5 mg Oral QHS  . multivitamin with minerals  1 tablet Oral Daily  . pantoprazole (PROTONIX) IV  40 mg Intravenous Q12H  . potassium chloride  10 mEq Oral Daily  . sertraline  12.5 mg Oral Q1200   Continuous Infusions: . amiodarone 30 mg/hr (03/27/18 1619)    Assessment/Plan:  1. Acute metabolic encephalopathy, could be from partially treated urinary tract infection.  Pharmacist reviewed the urine culture results and multiple organisms.  With the patient's drug allergies, she is put on clindamycin and fosfomycin to cover all the organisms.  Repeat urinalysis and urine culture does not show UTI.  Discontinue antibiotics. 2. Atrial fibrillation with rapid ventricular response. HR is still not controlled.  On amiodarone drip and Cardizem.  Last years echocardiogram showed a normal ejection fraction. Continue to  hold Eliquis because of GI bleeding and Do not recommend restarting Eliquis because of her dementia atrial fibrillation per Dr. Juliann Pares.  3.  Acute on chronic anemia due to occult GIB.  Fecal occult blood positive.  Placed on PPI.  Eliquis held. Hb down to 8.7. Continue to monitor hemoglobin.  Pt and her family do not wish to proceed with EGD or colonoscopy at this time.  4. Essential hypertension on Cardizem and losartan 5. History of dementia on Remeron and melatonin and Zoloft Physical therapy evaluation.  I discussed with Dr. Juliann Pares. Code Status:     Code Status Orders  (From admission, onward)        Start     Ordered   03/25/18 0633  Do not attempt resuscitation (DNR)  Continuous    Question Answer Comment  In the event of cardiac or respiratory ARREST Do not call a "code blue"   In the event of cardiac or respiratory ARREST Do not perform Intubation, CPR, defibrillation or ACLS   In the event of cardiac or respiratory ARREST Use medication by any route, position, wound care, and other measures to relive pain and suffering. May use oxygen, suction and manual treatment of airway obstruction as needed for comfort.      03/25/18 0632    Code Status History    Date Active Date Inactive Code Status Order ID Comments User Context   06/02/2017 1719 06/06/2017 1837 DNR [id number]  Adrian Saran, MD Inpatient   04/27/2017 1735 04/28/2017 2041 Full Code [id number]  Auburn Bilberry, MD Inpatient    Advance Directive Documentation     Most Recent Value  Type of Advance Directive  Out of facility DNR (pink MOST or yellow form), Healthcare Power of Attorney  Pre-existing out of facility DNR order (yellow form or pink MOST form)  -  "MOST" Form in Place?  -     Family Communication: I discussed with the patient's son. Disposition Plan: To be determined  Consultants:  Cardiology  Gastroenterology  Time spent: 35 minutes  Paula Thomas  [employer]

## 2018-03-28 LAB — CBC
HCT: 30 % — ABNORMAL LOW (ref 35.0–47.0)
Hemoglobin: 9.7 g/dL — ABNORMAL LOW (ref 12.0–16.0)
MCH: 27 pg (ref 26.0–34.0)
MCHC: 32.4 g/dL (ref 32.0–36.0)
MCV: 83.4 fL (ref 80.0–100.0)
PLATELETS: 541 10*3/uL — AB (ref 150–440)
RBC: 3.6 MIL/uL — AB (ref 3.80–5.20)
RDW: 15.8 % — ABNORMAL HIGH (ref 11.5–14.5)
WBC: 14.4 10*3/uL — ABNORMAL HIGH (ref 3.6–11.0)

## 2018-03-28 LAB — BASIC METABOLIC PANEL
Anion gap: 7 (ref 5–15)
BUN: 14 mg/dL (ref 8–23)
CHLORIDE: 106 mmol/L (ref 98–111)
CO2: 26 mmol/L (ref 22–32)
Calcium: 8 mg/dL — ABNORMAL LOW (ref 8.9–10.3)
Creatinine, Ser: 0.64 mg/dL (ref 0.44–1.00)
GFR calc Af Amer: >60 mL/min (ref >60)
GFR calc non Af Amer: >60 mL/min (ref >60)
Glucose, Bld: 106 mg/dL — ABNORMAL HIGH (ref 70–99)
POTASSIUM: 3.8 mmol/L (ref 3.5–5.1)
Sodium: 139 mmol/L (ref 135–145)

## 2018-03-28 LAB — GLUCOSE, CAPILLARY: Glucose-Capillary: 132 mg/dL — ABNORMAL HIGH (ref 70–99)

## 2018-03-28 MED ORDER — LORAZEPAM 2 MG/ML IJ SOLN
1.0000 mg | Freq: Four times a day (QID) | INTRAMUSCULAR | Status: DC | PRN
  Administered 2018-03-28 – 2018-03-29 (×4): 1 mg via INTRAVENOUS
  Filled 2018-03-28 (×4): qty 1

## 2018-03-28 MED ORDER — HYDROXYZINE HCL 25 MG PO TABS
25.0000 mg | ORAL_TABLET | Freq: Once | ORAL | Status: AC
  Administered 2018-03-28: 25 mg via ORAL
  Filled 2018-03-28: qty 1

## 2018-03-28 NOTE — Progress Notes (Signed)
Previous RN [employer]ough[employer]ylon just told me that Dr. Lady GaryFath told him to decrease dose of patient's amio drip at 30 mg/hr after 6 hours of changing rate. Talked to Dr. Lady GaryFath and clarify the order MD says to keep the rate at 60 mg/hr for now. No other concern at the moment. RN will continue to monitor.

## 2018-03-28 NOTE — Progress Notes (Addendum)
Pt is anxous. Notify [facility]. Will continue to monitor.  Update 0200. Doctor Anne HahnWillis place an order for hydroxyzine (atarax/vistaril) tablet 25 mg oral once. Will continue to monitor.

## 2018-03-28 NOTE — Evaluation (Signed)
SLP Cancellation Note  Patient Details Name: Paula Thomas MRN: [id number] DOB: [birth year]   Cancelled treatment:       Reason Eval/Treat Not Completed: SLP screened, no needs identified, will sign off  SLP spoke to NSG who stated pt was having general pain and discomfort and has not had any ssx of dysphagia. Pt was recently given Adovan for agitation and is resting peacefully. SLP educated NSG to contact slp if changes occur of if swallow difficulties occur.    Meredith PelStacie Harris Sauber 03/28/2018, 9:50 AM
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
## 2018-03-28 NOTE — Plan of Care (Signed)
  Problem: Education: Goal: Knowledge of General Education information will improve Description Including pain rating scale, medication(s)/side effects and non-pharmacologic comfort measures 03/28/2018 0705 by Myles GipKimrey, Becky Colan M, RN Outcome: Progressing 03/28/2018 0622 by Myles GipKimrey, Rodolph Hagemann M, RN Outcome: Progressing   Problem: Pain Managment: Goal: General experience of comfort will improve 03/28/2018 0705 by Myles GipKimrey, Miley Lindon M, RN Outcome: Progressing 03/28/2018 0558 by Myles GipKimrey, Brynja Marker M, RN Outcome: Progressing   Problem: Safety: Goal: Ability to remain free from injury will improve 03/28/2018 0705 by Myles GipKimrey, Esti Demello M, RN Outcome: Progressing 03/28/2018 0558 by Myles GipKimrey, Nalanie Winiecki M, RN Outcome: Progressing   Problem: Bowel/Gastric: Goal: Will show no signs and symptoms of gastrointestinal bleeding Outcome: Progressing

## 2018-03-28 NOTE — Progress Notes (Signed)
Patient ID: Paula Thomas, female   DOB: 07-30-1932, 84 y.o.   MRN: [id number]  [facility] PROGRESS NOTE  Paula Thomas WUJ:[id number] DOB: 07-30-1932 DOA: 03/24/2018 PCP: [facility]  HPI/Subjective:  Patient agitated heart rate poor control Unable to provide review of systems    Objective: Vitals:   03/28/18 0456 03/28/18 0756  BP: (!) 142/92 (!) 161/96  Pulse: (!) 127 (!) 133  Resp: 18 (!) 22  Temp: 98 F (36.7 C) 98.5 F (36.9 C)  SpO2: 94% (!) 82%    Filed Weights   03/26/18 0616 03/27/18 0458 03/28/18 0456  Weight: 49.4 kg (108 lb 12.8 oz) 46.1 kg (101 lb 9.6 oz) 46.4 kg (102 lb 3.2 oz)    ROS: Review of Systems  Unable to perform ROS: Dementia   Exam: Physical Exam  HENT:  Nose: No mucosal edema.  Mouth/Throat: No oropharyngeal exudate or posterior oropharyngeal edema.  Eyes: Pupils are equal, round, and reactive to light. Conjunctivae and lids are normal.  Neck: No JVD present. Carotid bruit is not present. No edema present. No thyroid mass and no thyromegaly present.  Cardiovascular: S1 normal, S2 normal and normal heart sounds. An irregularly irregular rhythm present. Exam reveals no gallop.  No murmur heard. Pulses:      Dorsalis pedis pulses are 2+ on the right side, and 2+ on the left side.  Respiratory: No respiratory distress. She has decreased breath sounds in the right lower field and the left lower field. She has no wheezes. She has no rhonchi. She has no rales.  GI: Soft. Bowel sounds are normal. There is no tenderness.  Musculoskeletal:       Right ankle: She exhibits no swelling.       Left ankle: She exhibits no swelling.  Lymphadenopathy:    She has no cervical adenopathy.  Skin: Skin is warm. Nails show no clubbing.  Lower extremities and Unna boots.  Psychiatric: She has a normal mood and affect.      Data Reviewed: Basic Metabolic Panel: Recent Labs  Lab 03/24/18 2033 03/25/18 0651 03/25/18 1349  03/28/18 0517  NA 139 139 138 139  K 3.7 4.0 3.8 3.8  CL 103 109 107 106  CO2 27 23 23 26   GLUCOSE 105* 126* 112* 106*  BUN 21 20 19 14   CREATININE 0.86 0.71 0.76 0.64  CALCIUM 9.1 8.2* 8.1* 8.0*   CBC: Recent Labs  Lab 03/24/18 2033 03/25/18 0651 03/26/18 0345 03/27/18 0735 03/28/18 0517  WBC 14.3* 13.3*  --   --  14.4*  NEUTROABS 12.0*  --   --   --   --   HGB 8.9* 8.7* 8.4* 8.7* 9.7*  HCT 27.6* 26.3*  --   --  30.0*  MCV 83.8 83.9  --   --  83.4  PLT 570* 557*  --   --  541*   BNP (last 3 results) Recent Labs    02/16/18 0242  BNP 425.0*    CBG: Recent Labs  Lab 03/25/18 0824 03/26/18 0752 03/27/18 0807 03/28/18 0758  GLUCAP 130* 100* 91 132*      Studies: No results found.  Scheduled Meds: . acidophilus  1 capsule Oral Daily  . docusate sodium  100 mg Oral BID  . feeding supplement (ENSURE ENLIVE)  237 mL Oral BID BM  . losartan  50 mg Oral Daily  . Melatonin  2.5 mg Oral QHS  . memantine  10 mg Oral BID  . mirtazapine  7.5 mg Oral QHS  . multivitamin with minerals  1 tablet Oral Daily  . pantoprazole (PROTONIX) IV  40 mg Intravenous Q12H  . potassium chloride  10 mEq Oral Daily  . sertraline  12.5 mg Oral Q1200   Continuous Infusions: . amiodarone 60 mg/hr (03/28/18 1332)    Assessment/Plan:  1. Acute metabolic encephalopathy,  Likely multifactorial continues to be confused Ativan for agitation 2. Atrial fibrillation with rapid ventricular response. HR is still not controlled.   Discussed with cardiology continue amiodarone 3.  Acute on chronic anemia due to occult GIB.  Fecal occult blood positive.  Placed on PPI.  Eliquis held.  Hemoglobin stable. Continue to monitor hemoglobin.  Pt and her family do not wish to proceed with EGD or colonoscopy at this time.  4. Essential hypertension on Cardizem and losartan 5. History of dementia on Remeron and melatonin and Zoloft Physical therapy evaluation.  I discussed with Dr. Juliann Pares. Code  Status:     Code Status Orders  (From admission, onward)        Start     Ordered   03/25/18 0633  Do not attempt resuscitation (DNR)  Continuous    Question Answer Comment  In the event of cardiac or respiratory ARREST Do not call a "code blue"   In the event of cardiac or respiratory ARREST Do not perform Intubation, CPR, defibrillation or ACLS   In the event of cardiac or respiratory ARREST Use medication by any route, position, wound care, and other measures to relive pain and suffering. May use oxygen, suction and manual treatment of airway obstruction as needed for comfort.      03/25/18 0632    Code Status History    Date Active Date Inactive Code Status Order ID Comments User Context   06/02/2017 1719 06/06/2017 1837 DNR [id number]  Adrian Saran, MD Inpatient   04/27/2017 1735 04/28/2017 2041 Full Code [id number]  Auburn Bilberry, MD Inpatient    Advance Directive Documentation     Most Recent Value  Type of Advance Directive  Out of facility DNR (pink MOST or yellow form), Healthcare Power of Attorney  Pre-existing out of facility DNR order (yellow form or pink MOST form)  -  "MOST" Form in Place?  -     Family Communication: I discussed with the patient's son. Disposition Plan: To be determined  Consultants:  Cardiology  Gastroenterology  Time spent: 35 minutes  Arilla Hice [employer]

## 2018-03-29 LAB — GLUCOSE, CAPILLARY: GLUCOSE-CAPILLARY: 113 mg/dL — AB (ref 70–99)

## 2018-03-29 NOTE — Progress Notes (Signed)
Patient ID: Paula Thomas, female   DOB: 03-Apr-1932, 84 y.o.   MRN: [id number]  [facility] PROGRESS NOTE  Anani Gu Oquin WUJ:[id number] DOB: 1932-03-06 DOA: 03/24/2018 PCP: [facility]  HPI/Subjective:  Continues to be agitated requiring sedation.  Heart rate continues to be intermittently poor control   Objective: Vitals:   03/29/18 0805 03/29/18 1517  BP: (!) 141/78 (!) 151/111  Pulse: 87 (!) 112  Resp: (!) 22 (!) 22  Temp: 98.3 F (36.8 C) 98.5 F (36.9 C)  SpO2: 92% 95%    Filed Weights   03/27/18 0458 03/28/18 0456 03/29/18 0446  Weight: 46.1 kg (101 lb 9.6 oz) 46.4 kg (102 lb 3.2 oz) 46.7 kg (103 lb)    ROS: Review of Systems  Unable to perform ROS: Dementia   Exam: Physical Exam  HENT:  Nose: No mucosal edema.  Mouth/Throat: No oropharyngeal exudate or posterior oropharyngeal edema.  Eyes: Pupils are equal, round, and reactive to light. Conjunctivae and lids are normal.  Neck: No JVD present. Carotid bruit is not present. No edema present. No thyroid mass and no thyromegaly present.  Cardiovascular: S1 normal, S2 normal and normal heart sounds. An irregularly irregular rhythm present. Exam reveals no gallop.  No murmur heard. Pulses:      Dorsalis pedis pulses are 2+ on the right side, and 2+ on the left side.  Respiratory: No respiratory distress. She has decreased breath sounds in the right lower field and the left lower field. She has no wheezes. She has no rhonchi. She has no rales.  GI: Soft. Bowel sounds are normal. There is no tenderness.  Musculoskeletal:       Right ankle: She exhibits no swelling.       Left ankle: She exhibits no swelling.  Lymphadenopathy:    She has no cervical adenopathy.  Skin: Skin is warm. Nails show no clubbing.  Lower extremities and Unna boots.  Psychiatric: She has a normal mood and affect.      Data Reviewed: Basic Metabolic Panel: Recent Labs  Lab 03/24/18 2033 03/25/18 0651  03/25/18 1349 03/28/18 0517  NA 139 139 138 139  K 3.7 4.0 3.8 3.8  CL 103 109 107 106  CO2 27 23 23 26   GLUCOSE 105* 126* 112* 106*  BUN 21 20 19 14   CREATININE 0.86 0.71 0.76 0.64  CALCIUM 9.1 8.2* 8.1* 8.0*   CBC: Recent Labs  Lab 03/24/18 2033 03/25/18 0651 03/26/18 0345 03/27/18 0735 03/28/18 0517  WBC 14.3* 13.3*  --   --  14.4*  NEUTROABS 12.0*  --   --   --   --   HGB 8.9* 8.7* 8.4* 8.7* 9.7*  HCT 27.6* 26.3*  --   --  30.0*  MCV 83.8 83.9  --   --  83.4  PLT 570* 557*  --   --  541*   BNP (last 3 results) Recent Labs    02/16/18 0242  BNP 425.0*    CBG: Recent Labs  Lab 03/25/18 0824 03/26/18 0752 03/27/18 0807 03/28/18 0758 03/29/18 0806  GLUCAP 130* 100* 91 132* 113*      Studies: No results found.  Scheduled Meds: . acidophilus  1 capsule Oral Daily  . docusate sodium  100 mg Oral BID  . feeding supplement (ENSURE ENLIVE)  237 mL Oral BID BM  . losartan  50 mg Oral Daily  . Melatonin  2.5 mg Oral QHS  . memantine  10 mg Oral BID  .  mirtazapine  7.5 mg Oral QHS  . multivitamin with minerals  1 tablet Oral Daily  . pantoprazole (PROTONIX) IV  40 mg Intravenous Q12H  . potassium chloride  10 mEq Oral Daily  . sertraline  12.5 mg Oral Q1200   Continuous Infusions: . amiodarone 60 mg/hr (03/29/18 1425)    Assessment/Plan:  1. Acute metabolic encephalopathy,  Likely multifactorial continues to be confused continue Ativan for agitation 2. Atrial fibrillation with rapid ventricular response.  Continue amiodarone 3. Acute on chronic anemia due to occult GIB.  Fecal occult blood positive.  Follow H&H no need for transfusion patient not a candidate for any evaluation due to her advanced dementia 4. Essential hypertension on Cardizem and losartan 5. History of dementia on Remeron and melatonin and Zoloft able to take orally  Code Status:     Code Status Orders  (From admission, onward)        Start     Ordered   03/25/18 0633  Do not  attempt resuscitation (DNR)  Continuous    Question Answer Comment  In the event of cardiac or respiratory ARREST Do not call a "code blue"   In the event of cardiac or respiratory ARREST Do not perform Intubation, CPR, defibrillation or ACLS   In the event of cardiac or respiratory ARREST Use medication by any route, position, wound care, and other measures to relive pain and suffering. May use oxygen, suction and manual treatment of airway obstruction as needed for comfort.      03/25/18 0632    Code Status History    Date Active Date Inactive Code Status Order ID Comments User Context   06/02/2017 1719 06/06/2017 1837 DNR [id number]  Adrian SaranMody, Sital, MD Inpatient   04/27/2017 1735 04/28/2017 2041 Full Code [id number]  Auburn BilberryPatel, Andreu Drudge, MD Inpatient    Advance Directive Documentation     Most Recent Value  Type of Advance Directive  Out of facility DNR (pink MOST or yellow form), Healthcare Power of Attorney  Pre-existing out of facility DNR order (yellow form or pink MOST form)  -  "MOST" Form in Place?  -     Family Communication: I discussed with the patient's son. Disposition Plan: To be determined  Consultants:  Cardiology  Gastroenterology  Time spent: 35 minutes  Kinney Sackmann [employer]  [employer]

## 2018-03-29 NOTE — Progress Notes (Signed)
Patient Name: Paula Thomas Date of Encounter: 03/29/2018  Hospital Problem List     Active Problems:   GIB (gastrointestinal bleeding)   GI bleed   Atrial fibrillation with RVR [facility])    Patient Profile     Patient very confused and agitated.  Not able to give history.  Subjective   Confused and agitated  Inpatient Medications    . acidophilus  1 capsule Oral Daily  . docusate sodium  100 mg Oral BID  . feeding supplement (ENSURE ENLIVE)  237 mL Oral BID BM  . losartan  50 mg Oral Daily  . Melatonin  2.5 mg Oral QHS  . memantine  10 mg Oral BID  . mirtazapine  7.5 mg Oral QHS  . multivitamin with minerals  1 tablet Oral Daily  . pantoprazole (PROTONIX) IV  40 mg Intravenous Q12H  . potassium chloride  10 mEq Oral Daily  . sertraline  12.5 mg Oral Q1200    Vital Signs    Vitals:   03/28/18 1608 03/28/18 2003 03/29/18 0446 03/29/18 0805  BP: (!) 163/102 (!) 160/99 (!) 152/94 (!) 141/78  Pulse: (!) 105 (!) 106 (!) 113 87  Resp: 20 18 18  (!) 22  Temp: 98.2 F (36.8 C) 97.7 F (36.5 C) 98 F (36.7 C) 98.3 F (36.8 C)  TempSrc:  Oral Oral   SpO2: (!) 84% 96% 93% 92%  Weight:   46.7 kg (103 lb)   Height:        Intake/Output Summary (Last 24 hours) at 03/29/2018 0918 Last data filed at 03/29/2018 0604 Gross per 24 hour  Intake -  Output 0 ml  Net 0 ml   Filed Weights   03/27/18 0458 03/28/18 0456 03/29/18 0446  Weight: 46.1 kg (101 lb 9.6 oz) 46.4 kg (102 lb 3.2 oz) 46.7 kg (103 lb)    Physical Exam    GEN: Well nourished, well developed, in no acute distress.  HEENT: normal.  Neck: Supple, no JVD, carotid bruits, or masses. Cardiac: RRR, no murmurs, rubs, or gallops. No clubbing, cyanosis, edema.  Radials/DP/PT 2+ and equal bilaterally.  Respiratory:  Respirations regular and unlabored, clear to auscultation bilaterally. GI: Soft, nontender, nondistended, BS + x 4. MS: no deformity or atrophy. Skin: warm and dry, no rash. Neuro:  Strength and  sensation are intact. Psych: Normal affect.  Labs    CBC Recent Labs    03/27/18 0735 03/28/18 0517  WBC  --  14.4*  HGB 8.7* 9.7*  HCT  --  30.0*  MCV  --  83.4  PLT  --  541*   Basic Metabolic Panel Recent Labs    65/78/46 0517  NA 139  K 3.8  CL 106  CO2 26  GLUCOSE 106*  BUN 14  CREATININE 0.64  CALCIUM 8.0*   Liver Function Tests No results for input(s): AST, ALT, ALKPHOS, BILITOT, PROT, ALBUMIN in the last 72 hours. No results for input(s): LIPASE, AMYLASE in the last 72 hours. Cardiac Enzymes No results for input(s): CKTOTAL, CKMB, CKMBINDEX, TROPONINI in the last 72 hours. BNP No results for input(s): BNP in the last 72 hours. D-Dimer No results for input(s): DDIMER in the last 72 hours. Hemoglobin A1C No results for input(s): HGBA1C in the last 72 hours. Fasting Lipid Panel No results for input(s): CHOL, HDL, LDLCALC, TRIG, CHOLHDL, LDLDIRECT in the last 72 hours. Thyroid Function Tests No results for input(s): TSH, T4TOTAL, T3FREE, THYROIDAB in the last 72 hours.  Invalid input(s): FREET3  Telemetry    Atrial fibrillation with variable but occasionally fairly rapid ventricular response  ECG    Atrial fibrillation with rapid ventricular response  Radiology    Ct Head Wo Contrast  Result Date: 03/24/2018 CLINICAL DATA:  84 year old with head trauma.  Fall. EXAM: CT HEAD WITHOUT CONTRAST TECHNIQUE: Contiguous axial images were obtained from the base of the skull through the vertex without intravenous contrast. COMPARISON:  02/10/2018 FINDINGS: Brain: Stable cerebral atrophy with diffuse low-density throughout the periventricular and subcortical white matter suggesting chronic changes. No evidence for acute hemorrhage, mass lesion, midline shift, hydrocephalus or large infarct. Stable linear low-density area in the left cerebellar hemisphere may represent an old infarct. Vascular: No hyperdense vessel or unexpected calcification. Skull: Normal.  Negative for fracture or focal lesion. Sinuses/Orbits: No acute finding. Other: None. IMPRESSION: No acute intracranial abnormality. Atrophy and evidence for chronic small vessel ischemic disease. Electronically Signed   By: Richarda OverlieAdam  Henn M.D.   On: 03/24/2018 21:35    Assessment & Plan    Atrial fibrillation-not able to take p.o. due to agitation and confusion.  Currently on IV amiodarone.  We will continue with this until she is able to take p.o.  Rates have been in the 100s to 130s.  Will remain with this for now.  Does not take p.o.  Outpatient regimen will depend on the patient's social situation.  Malnutrition-need to address hydration and nutritious intake.  Patient is only able to take sips.  Signed, Darlin PriestlyKenneth A. Khristian Phillippi MD 03/29/2018, 9:18 AM  Pager: [phone]

## 2018-03-29 NOTE — Progress Notes (Signed)
PT Cancellation Note  Patient Details Name: Audrie Lialma Dean Kendall MRN: [id number] DOB: 1932-05-01   Cancelled Treatment:     Order received.  Chart reviewed.  RN consulted 03/28/2018 and pt's HR is still in the 130's.  Will hold until pt is more medically appropriate.   Glenetta HewSarah Livian Vanderbeck, PT, DPT 03/29/2018, 7:33 AM

## 2018-03-29 NOTE — Progress Notes (Signed)
Advanced care plan.  Purpose of the Encounter: Goals of care comfort care Parties in Attendance: Patient's son and daughter-in-law  Patient's Decision Capacity: Not intact  Subjective/Patient's story: Patient is 84 year old with dementia admitted with possible GI bleed and A. fib with RVR.  She continues to show no improvement.  Continues to be very agitated and is being sedated.  She is not eating or drinking much   Objective/Medical story  Discussed with the patient's son regarding options of comfort care while in the hospital, possible transfer to hospice facility, or going to nursing home with palliative care following her with transition to hospice  Goals of care determination:  Continue current care son is waiting for his other brother and other family members to arrive prior to making any further decision   CODE STATUS: DNR  Time spent discussing advanced care planning: 16 minutes

## 2018-03-29 NOTE — Progress Notes (Signed)
PT Cancellation Note  Patient Details Name: Paula Thomas MRN: [id number] DOB: 24-Nov-1931   Cancelled Treatment:    Reason Eval/Treat Not Completed: Patient's level of consciousness;Medical issues which prohibited therapy.  Order received.  Chart reviewed.  RN consulted.  Upon PT arrival, pt very lethargic, restless with eyes remaining closed when PT spoke to pt.  Pt not responsive to commands.  Pt's son in room and states that pt has shown recent decline and that they will most likely be seeking SNF placement with palliative care at this point.  Pt is currently not a candidate for PT and orders will be discharged at this time.  Please re-consult later if need arises.   Glenetta HewSarah Hersey Maclellan, PT, DPT 03/29/2018, 2:07 PM

## 2018-03-29 NOTE — Progress Notes (Signed)
Patient's Niece has visited daily.  She told the RN that the patient had broken a hip when she fell at home.  Family did not know how long she was down alone.  She was found on the other hip, so that hip was x-rayed and was fine.  Months later when she was limping, new xray of other side showed it was broken and healing incorrectly.  The hip was re-broken and surgery done.  Patient has a history of multiple falls since that incident.   Her son visited Friday and spoke with the RN and returned Sunday.  Family is working with the care team for placement.  Patient has been refusing oral medicines and refusing to eat.  Each day she would eat a few bites for the RN, but then would refuse.    Today patient began coughing up mucus. RN put suction and supplies by the bedside.  Patient is unable to spit out what she is coughing up.  Heart rate ranges from 120s-130s with spikes to 150s when agitated or coughing.  Cardiologist was informed; he said that the patient should continue her current IV medicine.   Christean GriefShylon M Helaine Yackel, RN

## 2018-03-30 LAB — GLUCOSE, CAPILLARY
GLUCOSE-CAPILLARY: 108 mg/dL — AB (ref 70–99)
Glucose-Capillary: 109 mg/dL — ABNORMAL HIGH (ref 70–99)

## 2018-03-30 MED ORDER — SODIUM CHLORIDE 0.9% FLUSH: 3.0000 mL | INTRAVENOUS | Status: DC | PRN

## 2018-03-30 MED ORDER — ACETAMINOPHEN 650 MG RE SUPP: 650.0000 mg | Freq: Four times a day (QID) | RECTAL | Status: DC | PRN

## 2018-03-30 MED ORDER — ACETAMINOPHEN 325 MG PO TABS: 650.0000 mg | ORAL_TABLET | Freq: Four times a day (QID) | ORAL | Status: DC | PRN

## 2018-03-30 MED ORDER — LORAZEPAM 2 MG/ML PO CONC: 1.0000 mg | ORAL | Status: DC | PRN

## 2018-03-30 MED ORDER — SODIUM CHLORIDE 0.9% FLUSH: 3.0000 mL | Freq: Two times a day (BID) | INTRAVENOUS | Status: DC

## 2018-03-30 MED ORDER — NORMAL SALINE NICU FLUSH: 0.5000 mL | INTRAVENOUS | Status: DC | PRN

## 2018-03-30 MED ORDER — MORPHINE SULFATE (CONCENTRATE) 10 MG/0.5ML PO SOLN
5.0000 mg | ORAL | Status: DC | PRN
  Administered 2018-03-30: 5 mg via SUBLINGUAL
  Filled 2018-03-30: qty 1

## 2018-03-30 MED ORDER — MAGIC MOUTHWASH
15.0000 mL | Freq: Four times a day (QID) | ORAL | Status: DC | PRN
  Filled 2018-03-30: qty 20

## 2018-03-30 MED ORDER — GLYCOPYRROLATE 0.2 MG/ML IJ SOLN
0.2000 mg | INTRAMUSCULAR | Status: DC | PRN
  Administered 2018-03-31: 0.2 mg via INTRAVENOUS
  Filled 2018-03-30: qty 1

## 2018-03-30 MED ORDER — LORAZEPAM 1 MG PO TABS: 1.0000 mg | ORAL_TABLET | ORAL | Status: DC | PRN

## 2018-03-30 MED ORDER — LORAZEPAM 2 MG/ML IJ SOLN
1.0000 mg | INTRAMUSCULAR | Status: DC | PRN
  Administered 2018-03-31 (×2): 1 mg via INTRAVENOUS
  Filled 2018-03-30 (×3): qty 1

## 2018-03-30 MED ORDER — MORPHINE SULFATE (CONCENTRATE) 10 MG/0.5ML PO SOLN: 5.0000 mg | ORAL | Status: DC | PRN

## 2018-03-30 MED ORDER — SODIUM CHLORIDE 0.9% FLUSH
3.0000 mL | Freq: Two times a day (BID) | INTRAVENOUS | Status: DC
  Administered 2018-03-30 – 2018-03-31 (×2): 3 mL via INTRAVENOUS

## 2018-03-30 MED ORDER — MORPHINE SULFATE (PF) 2 MG/ML IV SOLN
1.0000 mg | INTRAVENOUS | Status: DC | PRN
  Administered 2018-03-30 – 2018-03-31 (×6): 1 mg via INTRAVENOUS
  Filled 2018-03-30 (×6): qty 1

## 2018-03-30 MED ORDER — SODIUM CHLORIDE 0.9 % IV SOLN: 250.0000 mL | INTRAVENOUS | Status: DC | PRN

## 2018-03-30 MED ORDER — SODIUM CHLORIDE 0.9% FLUSH
3.0000 mL | Freq: Two times a day (BID) | INTRAVENOUS | Status: DC
  Administered 2018-03-30: 3 mL via INTRAVENOUS

## 2018-03-30 NOTE — Progress Notes (Signed)
Patient ID: Paula Thomas, female   DOB: 1932-06-18, 84 y.o.   MRN: [id number]  [employer] PROGRESS NOTE  Iverson Alaminlma Dean Saiz WUJ:[id number] DOB: 1932-06-18 DOA: 03/24/2018 PCP: [facility]  HPI/Subjective:  Patient currently sedated not eating  Heart rate improved on amiodarone drip   Objective: Vitals:   03/30/18 0404 03/30/18 0756  BP: 128/83 (!) 135/94  Pulse: (!) 101 (!) 102  Resp: 17 20  Temp: (!) 97.5 F (36.4 C) 97.8 F (36.6 C)  SpO2: 95% 96%    Filed Weights   03/28/18 0456 03/29/18 0446 03/30/18 0404  Weight: 46.4 kg (102 lb 3.2 oz) 46.7 kg (103 lb) 48.3 kg (106 lb 8 oz)    ROS: Review of Systems  Unable to perform ROS: Dementia   Exam: Physical Exam  HENT:  Nose: No mucosal edema.  Mouth/Throat: No oropharyngeal exudate or posterior oropharyngeal edema.  Eyes: Pupils are equal, round, and reactive to light. Conjunctivae and lids are normal.  Neck: No JVD present. Carotid bruit is not present. No edema present. No thyroid mass and no thyromegaly present.  Cardiovascular: S1 normal, S2 normal and normal heart sounds. An irregularly irregular rhythm present. Exam reveals no gallop.  No murmur heard. Pulses:      Dorsalis pedis pulses are 2+ on the right side, and 2+ on the left side.  Respiratory: No respiratory distress. She has decreased breath sounds in the right lower field and the left lower field. She has no wheezes. She has no rhonchi. She has no rales.  GI: Soft. Bowel sounds are normal. There is no tenderness.  Musculoskeletal:       Right ankle: She exhibits no swelling.       Left ankle: She exhibits no swelling.  Lymphadenopathy:    She has no cervical adenopathy.  Skin: Skin is warm. Nails show no clubbing.  Lower extremities and Unna boots.  Psychiatric: She has a normal mood and affect.      Data Reviewed: Basic Metabolic Panel: Recent Labs  Lab 03/24/18 2033 03/25/18 0651 03/25/18 1349 03/28/18 0517  NA  139 139 138 139  K 3.7 4.0 3.8 3.8  CL 103 109 107 106  CO2 27 23 23 26   GLUCOSE 105* 126* 112* 106*  BUN 21 20 19 14   CREATININE 0.86 0.71 0.76 0.64  CALCIUM 9.1 8.2* 8.1* 8.0*   CBC: Recent Labs  Lab 03/24/18 2033 03/25/18 0651 03/26/18 0345 03/27/18 0735 03/28/18 0517  WBC 14.3* 13.3*  --   --  14.4*  NEUTROABS 12.0*  --   --   --   --   HGB 8.9* 8.7* 8.4* 8.7* 9.7*  HCT 27.6* 26.3*  --   --  30.0*  MCV 83.8 83.9  --   --  83.4  PLT 570* 557*  --   --  541*   BNP (last 3 results) Recent Labs    02/16/18 0242  BNP 425.0*    CBG: Recent Labs  Lab 03/27/18 0807 03/28/18 0758 03/29/18 0806 03/30/18 0752 03/30/18 1152  GLUCAP 91 132* 113* 109* 108*      Studies: No results found.  Scheduled Meds: . acidophilus  1 capsule Oral Daily  . docusate sodium  100 mg Oral BID  . feeding supplement (ENSURE ENLIVE)  237 mL Oral BID BM  . losartan  50 mg Oral Daily  . Melatonin  2.5 mg Oral QHS  . memantine  10 mg Oral BID  . mirtazapine  7.5  mg Oral QHS  . multivitamin with minerals  1 tablet Oral Daily  . pantoprazole (PROTONIX) IV  40 mg Intravenous Q12H  . potassium chloride  10 mEq Oral Daily  . sertraline  12.5 mg Oral Q1200  . sodium chloride flush  3 mL Intravenous Q12H  . sodium chloride flush  3 mL Intravenous Q12H   Continuous Infusions: . amiodarone 30 mg/hr (03/30/18 0944)    Assessment/Plan:  1. Acute metabolic encephalopathy,  Likely multifactorial continues to be confused continue Ativan for agitation no improvement 2. Atrial fibrillation with rapid ventricular response.  Continue amiodarone IV drip 3. Acute on chronic anemia due to occult GIB.  Fecal occult blood positive.  Follow H&H no need for transfusion patient not a candidate for any evaluation due to her advanced dementia 4. Essential hypertension on Cardizem and losartan 5. History of dementia on Remeron and melatonin and Zoloft able to take orally   Discussed with patient's son  yesterday they are waiting for other family members to arrive and will make decision on discharge likely to facility with hospice following her hospice home if appropriate  Code Status:     Code Status Orders  (From admission, onward)        Start     Ordered   03/25/18 0633  Do not attempt resuscitation (DNR)  Continuous    Question Answer Comment  In the event of cardiac or respiratory ARREST Do not call a "code blue"   In the event of cardiac or respiratory ARREST Do not perform Intubation, CPR, defibrillation or ACLS   In the event of cardiac or respiratory ARREST Use medication by any route, position, wound care, and other measures to relive pain and suffering. May use oxygen, suction and manual treatment of airway obstruction as needed for comfort.      03/25/18 0632    Code Status History    Date Active Date Inactive Code Status Order ID Comments User Context   06/02/2017 1719 06/06/2017 1837 DNR [id number]  Adrian Saran, MD Inpatient   04/27/2017 1735 04/28/2017 2041 Full Code [id number]  Auburn Bilberry, MD Inpatient    Advance Directive Documentation     Most Recent Value  Type of Advance Directive  Out of facility DNR (pink MOST or yellow form), Healthcare Power of Attorney  Pre-existing out of facility DNR order (yellow form or pink MOST form)  -  "MOST" Form in Place?  -     Family Communication: I discussed with the patient's son. Disposition Plan: To be determined  Consultants:  Cardiology  Gastroenterology  Time spent: 35 minutes  [place] Swo[employer]

## 2018-03-30 NOTE — Progress Notes (Signed)
Patient doesn't cooperate with taking meds.  She responds to voice and is able to tell me she is cold.

## 2018-03-30 NOTE — Progress Notes (Signed)
Pt transferred to 1C with family at bedside. Family refused VS prior to transport. Pt arrived in room 113 with no concerns offered.

## 2018-03-30 NOTE — Care Management Important Message (Signed)
Copy of signed IM left with patient in room.  

## 2018-03-30 NOTE — Progress Notes (Signed)
Pt moaning.  Gave Roxanol orally with syringe but pt spit some of it out.  Waited 30 minutes and pt has had no relief so gave 1 mg Morphine IV.  Pt complains of pain at IV site but infused easily. Family at bedside.  Call out to doctor for medication to help with secretions as pt complains of thick mucus that she can't clear.  Oral care x 2. Henriette CombsSarah Delle Andrzejewski RN

## 2018-03-30 NOTE — Progress Notes (Signed)
Advanced care plan.  Purpose of the Encounter: CODE STATUS  Parties in Attendance: patient daughter and son  Patient's Decision Capacity:non intact  Subjective/Patient's story:pt is [age] with dementia admited with gi bleed and afib with rvr    Objective/Medical story  Discussing were made regarding no improvement in condtion  Goals of care determination: comfort care measure to start now    CODE STATUS: dnr   Time spent discussing advanced care planning: 16 minutes

## 2018-03-30 NOTE — Progress Notes (Signed)
Patient Name: Paula Thomas Date of Encounter: 03/30/2018  Hospital Problem List     Active Problems:   GIB (gastrointestinal bleeding)   GI bleed   Atrial fibrillation with RVR [facility](HCC)    Patient Profile     84 year old female with dementia and atrial fibrillation with rapid ventricular response.  Has been difficult to manage due to not taking p.o. medications, not taking much nourishment or fluid.  Subjective   Confused  Inpatient Medications    . acidophilus  1 capsule Oral Daily  . docusate sodium  100 mg Oral BID  . feeding supplement (ENSURE ENLIVE)  237 mL Oral BID BM  . losartan  50 mg Oral Daily  . Melatonin  2.5 mg Oral QHS  . memantine  10 mg Oral BID  . mirtazapine  7.5 mg Oral QHS  . multivitamin with minerals  1 tablet Oral Daily  . pantoprazole (PROTONIX) IV  40 mg Intravenous Q12H  . potassium chloride  10 mEq Oral Daily  . sertraline  12.5 mg Oral Q1200    Vital Signs    Vitals:   03/29/18 0805 03/29/18 1517 03/29/18 2035 03/30/18 0404  BP: (!) 141/78 (!) 151/111 (!) 147/93 128/83  Pulse: 87 (!) 112 98 (!) 101  Resp: (!) 22 (!) 22 16 17   Temp: 98.3 F (36.8 C) 98.5 F (36.9 C) 98.6 F (37 C) (!) 97.5 F (36.4 C)  TempSrc:  Oral Oral Oral  SpO2: 92% 95%  95%  Weight:    48.3 kg (106 lb 8 oz)  Height:        Intake/Output Summary (Last 24 hours) at 03/30/2018 0711 Last data filed at 03/30/2018 0651 Gross per 24 hour  Intake 350 ml  Output -  Net 350 ml   Filed Weights   03/28/18 0456 03/29/18 0446 03/30/18 0404  Weight: 46.4 kg (102 lb 3.2 oz) 46.7 kg (103 lb) 48.3 kg (106 lb 8 oz)    Physical Exam    GEN: Thin somewhat anxious female. HEENT: normal.  Neck: Supple, no JVD, carotid bruits, or masses. Cardiac: Irregular irregular rate and rhythm.  No clubbing, cyanosis, edema.  Radials/DP/PT 2+ and equal bilaterally.  Respiratory:  Respirations regular and unlabored, clear to auscultation bilaterally. GI: Soft, nontender,  nondistended, BS + x 4. MS: no deformity or atrophy. Skin: warm and dry, no rash. Neuro:  Strength and sensation are intact. Psych: Confused.  Labs    CBC Recent Labs    03/27/18 0735 03/28/18 0517  WBC  --  14.4*  HGB 8.7* 9.7*  HCT  --  30.0*  MCV  --  83.4  PLT  --  541*   Basic Metabolic Panel Recent Labs    04/54/0907/27/19 0517  NA 139  K 3.8  CL 106  CO2 26  GLUCOSE 106*  BUN 14  CREATININE 0.64  CALCIUM 8.0*   Liver Function Tests No results for input(s): AST, ALT, ALKPHOS, BILITOT, PROT, ALBUMIN in the last 72 hours. No results for input(s): LIPASE, AMYLASE in the last 72 hours. Cardiac Enzymes No results for input(s): CKTOTAL, CKMB, CKMBINDEX, TROPONINI in the last 72 hours. BNP No results for input(s): BNP in the last 72 hours. D-Dimer No results for input(s): DDIMER in the last 72 hours. Hemoglobin A1C No results for input(s): HGBA1C in the last 72 hours. Fasting Lipid Panel No results for input(s): CHOL, HDL, LDLCALC, TRIG, CHOLHDL, LDLDIRECT in the last 72 hours. Thyroid Function Tests No results for  input(s): TSH, T4TOTAL, T3FREE, THYROIDAB in the last 72 hours.  Invalid input(s): FREET3  Telemetry    Atrial fibrillation with variable but currently well controlled ventricular response.  ECG    Fibrillation with rapid ventricular response.  Radiology    Ct Head Wo Contrast  Result Date: 03/24/2018 CLINICAL DATA:  84 year old with head trauma.  Fall. EXAM: CT HEAD WITHOUT CONTRAST TECHNIQUE: Contiguous axial images were obtained from the base of the skull through the vertex without intravenous contrast. COMPARISON:  02/10/2018 FINDINGS: Brain: Stable cerebral atrophy with diffuse low-density throughout the periventricular and subcortical white matter suggesting chronic changes. No evidence for acute hemorrhage, mass lesion, midline shift, hydrocephalus or large infarct. Stable linear low-density area in the left cerebellar hemisphere may represent an  old infarct. Vascular: No hyperdense vessel or unexpected calcification. Skull: Normal. Negative for fracture or focal lesion. Sinuses/Orbits: No acute finding. Other: None. IMPRESSION: No acute intracranial abnormality. Atrophy and evidence for chronic small vessel ischemic disease. Electronically Signed   By: Richarda Overlie M.D.   On: 03/24/2018 21:35    Assessment & Plan    Atrial fibrillation-difficult to control due to not taking p.o.  Currently on amiodarone at 60 mg an hour.  Will attempt to decrease this to 30 mg an hour.  Would convert to p.o. if and when patient is able to take p.o..  Not a candidate for anticoagulation due to anemia, possible GI bleed and fall risk.  Confusion-head CT nonacute.    Signed, Darlin Priestly Fath MD 03/30/2018, 7:11 AM  Pager: [phone]

## 2018-03-31 MED ORDER — MORPHINE SULFATE (CONCENTRATE) 10 MG/0.5ML PO SOLN: 5.0000 mg | ORAL | Status: AC | PRN

## 2018-03-31 MED ORDER — LORAZEPAM 2 MG/ML IJ SOLN: 1.0000 mg | INTRAMUSCULAR | 0 refills | Status: AC | PRN

## 2018-03-31 MED ORDER — GLYCOPYRROLATE 0.2 MG/ML IJ SOLN: 0.2000 mg | INTRAMUSCULAR | Status: AC | PRN

## 2018-03-31 MED ORDER — LORAZEPAM 2 MG/ML PO CONC: 1.0000 mg | ORAL | 0 refills | Status: AC | PRN

## 2018-03-31 MED ORDER — MORPHINE SULFATE (CONCENTRATE) 10 MG/0.5ML PO SOLN
5.0000 mg | ORAL | Status: AC | PRN
Start: 2018-03-31 — End: ?

## 2018-03-31 MED ORDER — MORPHINE SULFATE (PF) 2 MG/ML IV SOLN: 1.0000 mg | INTRAVENOUS | 0 refills | Status: AC | PRN

## 2018-03-31 MED ORDER — LORAZEPAM 1 MG PO TABS: 1.0000 mg | ORAL_TABLET | ORAL | 0 refills | Status: AC | PRN

## 2018-03-31 MED ORDER — MAGIC MOUTHWASH: 15.0000 mL | Freq: Four times a day (QID) | ORAL | 0 refills | Status: AC | PRN

## 2018-03-31 NOTE — Clinical Social Work Note (Signed)
CSW received phone call from case manager, that physician said family may want hospice facility placement.  Patient's son is waiting for other family to arrive tomorrow to make a decision on what they want to do.  CSW continuing to follow patient's progress throughout discharge planning.  Paula KnackEric R. Giannie Soliday, MSW, [employer]CSWA [phone]

## 2018-03-31 NOTE — Progress Notes (Signed)
Patient discharged to hospice home via EMS. Family at bedside.

## 2018-03-31 NOTE — Discharge Summary (Signed)
[facility] - [place] at [facility], 84 y.o., DOB Oct 03, 1931, MRN [id number]. Admission date: 03/24/2018 Discharge Date 03/31/2018 Primary MD [facility], [facility] Admitting Physician Barbaraann Rondo, MD  Admission Diagnosis  Confusion [R41.0] Lower GI bleed [K92.2] Acute cystitis with hematuria [N30.01] Fall, initial encounter [W19.XXXA] Atrial fibrillation with RVR (HCC) [I48.91]  Discharge Diagnosis   Active Problems: Acute metabolic encephalopathy Atrial fibrillation with rapid ventricular rate Acute on chronic anemia due to occult GI bleed Essential hypertension History of dementia Essential hypertension     Hospital Course  Patient is 84 year old female brought in from her assisted living facility after she had a mechanical fall.  Patient in the emergency room was noted to be anemic and had positive guaiac stools.  She was admitted for further evaluation.  Patient does have dementia.  She was admitted for GI bleed however developed A. fib with RVR and requiring IV amiodarone therapy.  Heart rate continue to be under poor control.  Patient started becoming very agitated.  And required heavy sedation.  Patient showed no improvement therefore further discussions were held with the family she was made comfort care now going to hospice home.          Consults  cardiology  Significant Tests:  See full reports for all details     Ct Head Wo Contrast  Result Date: 03/24/2018 CLINICAL DATA:  84 year old with head trauma.  Fall. EXAM: CT HEAD WITHOUT CONTRAST TECHNIQUE: Contiguous axial images were obtained from the base of the skull through the vertex without intravenous contrast. COMPARISON:  02/10/2018 FINDINGS: Brain: Stable cerebral atrophy with diffuse low-density throughout the periventricular and subcortical white matter suggesting chronic changes. No evidence for acute hemorrhage, mass lesion, midline shift, hydrocephalus or large  infarct. Stable linear low-density area in the left cerebellar hemisphere may represent an old infarct. Vascular: No hyperdense vessel or unexpected calcification. Skull: Normal. Negative for fracture or focal lesion. Sinuses/Orbits: No acute finding. Other: None. IMPRESSION: No acute intracranial abnormality. Atrophy and evidence for chronic small vessel ischemic disease. Electronically Signed   By: Richarda Overlie M.D.   On: 03/24/2018 21:35       Today   Subjective:   Paula Thomas patient currently comfortable and sedated  Objective:   Blood pressure 131/74, pulse (!) 119, temperature 98.4 F (36.9 C), temperature source Oral, resp. rate 19, height 5\' 6"  (1.676 m), weight 48.3 kg (106 lb 8 oz), SpO2 94 %.  .  Intake/Output Summary (Last 24 hours) at 03/31/2018 1257 Last data filed at 03/31/2018 0300 Gross per 24 hour  Intake 0 ml  Output 0 ml  Net 0 ml    Exam VITAL SIGNS: Blood pressure 131/74, pulse (!) 119, temperature 98.4 F (36.9 C), temperature source Oral, resp. rate 19, height 5\' 6"  (1.676 m), weight 48.3 kg (106 lb 8 oz), SpO2 94 %.  GENERAL:  84 y.o.-year-old patient lying in the bed with no acute distress.  EYES: Pupils equal, round, reactive to light and accommodation. No scleral icterus. Extraocular muscles intact.  HEENT: Head atraumatic, normocephalic. Oropharynx and nasopharynx clear.  NECK:  Supple, no jugular venous distention. No thyroid enlargement, no tenderness.  LUNGS: Normal breath sounds bilaterally, no wheezing, rales,rhonchi or crepitation. No use of accessory muscles of respiration.  CARDIOVASCULAR: S1, S2 normal. No murmurs, rubs, or gallops.  ABDOMEN: Soft, nontender, nondistended. Bowel sounds present. No organomegaly or mass.  EXTREMITIES: Left upper extremity swelling  nEUROLOGIC: Sedated PSYCHIATRIC: Sedated  sKIN:  No obvious rash, lesion, or ulcer.   Data Review     CBC w Diff:  Lab Results  Component Value Date   WBC 14.4 (H)  03/28/2018   HGB 9.7 (L) 03/28/2018   HGB 11.8 02/07/2017   HCT 30.0 (L) 03/28/2018   HCT 36.0 02/07/2017   PLT 541 (H) 03/28/2018   PLT 405 (H) 02/07/2017   LYMPHOPCT 7 03/24/2018   LYMPHOPCT 24.0 08/18/2014   MONOPCT 8 03/24/2018   MONOPCT 7.9 08/18/2014   EOSPCT 0 03/24/2018   EOSPCT 1.3 08/18/2014   BASOPCT 1 03/24/2018   BASOPCT 0.7 08/18/2014   CMP:  Lab Results  Component Value Date   NA 139 03/28/2018   NA 138 02/07/2017   NA 143 08/18/2014   K 3.8 03/28/2018   K 3.5 08/18/2014   CL 106 03/28/2018   CL 109 (H) 08/18/2014   CO2 26 03/28/2018   CO2 26 08/18/2014   BUN 14 03/28/2018   BUN 17 02/07/2017   BUN 13 08/18/2014   CREATININE 0.64 03/28/2018   CREATININE 0.84 08/18/2014   PROT 7.0 02/16/2018   PROT 7.3 02/07/2017   PROT 7.8 08/17/2014   ALBUMIN 4.0 02/16/2018   ALBUMIN 4.4 02/07/2017   ALBUMIN 3.9 08/17/2014   BILITOT 0.7 02/16/2018   BILITOT 0.2 02/07/2017   BILITOT 0.5 08/17/2014   ALKPHOS 110 02/16/2018   ALKPHOS 87 08/17/2014   AST 32 02/16/2018   AST 36 08/17/2014   ALT 30 02/16/2018   ALT 27 08/17/2014  .  Micro Results Recent Results (from the past 240 hour(s))  Urine Culture     Status: None   Collection Time: 03/25/18 11:21 AM  Result Value Ref Range Status   Specimen Description   Final    URINE, RANDOM Performed at [facility], [address] [place], [place] [address]    Special Requests   Final    Normal Performed at [facility], [address] [place], [place] [address]    Culture   Final    NO GROWTH Performed at [facility], [address], [place], [place] [address]    Report Status 03/26/2018 FINAL  Final        Code Status Orders  (From admission, onward)        Start     Ordered   03/30/18 1630  Do not attempt resuscitation (DNR)  Continuous    Question Answer Comment  In the event of cardiac or respiratory ARREST Do not call a "code blue"   In the event of cardiac or  respiratory ARREST Do not perform Intubation, CPR, defibrillation or ACLS   In the event of cardiac or respiratory ARREST Use medication by any route, position, wound care, and other measures to relive pain and suffering. May use oxygen, suction and manual treatment of airway obstruction as needed for comfort.   Comments nurse may pronunce      03/30/18 1630    Code Status History    Date Active Date Inactive Code Status Order ID Comments User Context   03/25/2018 0632 03/30/2018 1630 DNR [id number]  Cammy Copa, MD Inpatient   06/02/2017 1719 06/06/2017 1837 DNR [id number]  Adrian Saran, MD Inpatient   04/27/2017 1735 04/28/2017 2041 Full Code [id number]  Auburn Bilberry, MD Inpatient    Advance Directive Documentation     Most Recent Value  Type of Advance Directive  Out of facility DNR (pink MOST or yellow form), Healthcare Power of  Attorney  Pre-existing out of facility DNR order (yellow form or pink MOST form)  -  "MOST" Form in Place?  -            Discharge Medications   Allergies as of 03/31/2018      Reactions   Lisinopril Cough   Penicillin G Potassium [penicillin G]    Has patient had a PCN reaction causing immediate rash, facial/tongue/throat swelling, SOB or lightheadedness with hypotension: Unknown Has patient had a PCN reaction causing severe rash involving mucus membranes or skin necrosis: Unknown Has patient had a PCN reaction that required hospitalization: Unknown Has patient had a PCN reaction occurring within the last 10 years: Unknown If all of the above answers are "NO", then may proceed with Cephalosporin use.   Sulfa Antibiotics    unknown      Medication List    STOP taking these medications   Acidophilus/Pectin Caps   cephALEXin 250 MG capsule Commonly known as:  KEFLEX   diltiazem 240 MG 24 hr capsule Commonly known as:  CARDIZEM CD   ELIQUIS 2.5 MG Tabs tablet Generic drug:  apixaban   furosemide 20 MG tablet Commonly known as:  LASIX    levofloxacin 500 MG tablet Commonly known as:  LEVAQUIN   losartan 50 MG tablet Commonly known as:  COZAAR   Melatonin 3 MG Tabs   memantine 10 MG tablet Commonly known as:  NAMENDA   mirtazapine 15 MG tablet Commonly known as:  REMERON   oxyCODONE 5 MG immediate release tablet Commonly known as:  ROXICODONE   polyethylene glycol packet Commonly known as:  MIRALAX / GLYCOLAX   potassium chloride 10 MEQ tablet Commonly known as:  K-DUR,KLOR-CON   sertraline 25 MG tablet Commonly known as:  ZOLOFT   THEREMS Tabs   traMADol 50 MG tablet Commonly known as:  ULTRAM     TAKE these medications   acetaminophen 500 MG tablet Commonly known as:  TYLENOL Take 1,000 mg by mouth 3 (three) times daily.   glycopyrrolate 0.2 MG/ML injection Commonly known as:  ROBINUL Inject 1 mL (0.2 mg total) into the vein every 4 (four) hours as needed (secretions).   LORazepam 1 MG tablet Commonly known as:  ATIVAN Take 1 tablet (1 mg total) by mouth every 4 (four) hours as needed for anxiety.   LORazepam 2 MG/ML concentrated solution Commonly known as:  ATIVAN Place 0.5 mLs (1 mg total) under the tongue every 4 (four) hours as needed for anxiety.   LORazepam 2 MG/ML injection Commonly known as:  ATIVAN Inject 0.5 mLs (1 mg total) into the vein every 4 (four) hours as needed for anxiety.   magic mouthwash Soln Take 15 mLs by mouth every 6 (six) hours as needed for mouth pain (mouth pain / discomfort).   morphine 2 MG/ML injection Inject 0.5 mLs (1 mg total) into the vein every 2 (two) hours as needed (or dyspnea).   morphine CONCENTRATE 10 MG/0.5ML Soln concentrated solution Take 0.25 mLs (5 mg total) by mouth every 2 (two) hours as needed for moderate pain (or dyspnea).   morphine CONCENTRATE 10 MG/0.5ML Soln concentrated solution Place 0.25 mLs (5 mg total) under the tongue every 2 (two) hours as needed for moderate pain (or dyspnea).          Total Time in preparing  paper work, data evaluation and todays exam - 35 minutes  Auburn BilberryShreyang Dhani Imel M.D on 03/31/2018 at 12:57 PM [facility]   Office  [phone]

## 2018-03-31 NOTE — Progress Notes (Signed)
Nutrition Brief Follow-Up Note  Chart reviewed. Patient now transitioning to comfort care.   No further nutrition interventions warranted at this time. Please consult RD as needed.   Delanee Xin, MS, RD, LDN Office: [phone] Pager: [phone] After Hours/Weekend Pager: [phone]    

## 2018-03-31 NOTE — Progress Notes (Signed)
New hospice home referral received from [place]. Patient is an 84 year old woman admitted to [facility] from [facility] on 7/23 following a mechanical fall, no injuries noted. In the Ed she was noted to have a hemoglobin of 8.9 and elevated WBC of 14.3. Fecal occult test was positive, she was admitted for treatment. She was also found to have A fib with RVR and required an amiodarone drip. She continued, despite medical interventions has continued to decline. Currently requiring Iv lorazepam and morphine for symptom management. Family has spoken with attending physician Dr. Posey Pronto and have chosen to transfer patient to the hospice home. Writer met in the room with patient's son [name], daughter in law and daughter Renate to initiate education regarding hospice services, philosophy and team approach to care with understanding voiced. Questions answered, consents signed. Patient seen lying in bed, appeared comfortable, respirations even unlabored. Patient information faxed to referral. Plan is for discharge to the hospice home this afternoon with signed DNR in place.Hospital care team and family updated. Thank you for the opportunity to be involved in the care of this patient and her family.  Flo Shanks RN, BSN, [place] [facility] [place], [facility] [phone]

## 2018-03-31 NOTE — Progress Notes (Signed)
Chaplain received a visit recommendation from the unit nurse who indicated that the patient was on comfort care. Upon entering the room, chaplain found the patient's son, daughter, and daughter-in-law at the bedside. They were welcoming and agreed that things were tough and they could use a visit. Patient sleeping comfortably. Daughter indicated that her mother (the patient) was "calling out to the Lord" yesterday. She also said that their mother was having a difficult time and was not ready to "go" because she was worried about her adult children, who have no children of their own. The family has strong and beautiful support system, but they cannot imagine life without their loving mother. Chaplain engaged family in stories of remembrance about the great woman their mother is and the impacts she's had on them and others. Daughter requested prayer and chaplain and family held hands at the bedside to do so. Chaplain prayed for the patient's peace and comfort, for the strength of the family, and their continued love and support of one another.    03/31/18 1005  Clinical Encounter Type  Visited With Family  Visit Type Initial;Spiritual support  Referral From Family  Spiritual Encounters  Spiritual Needs Prayer;Emotional

## 2018-03-31 NOTE — Clinical Social Work Note (Signed)
CSW met with patient to discuss discharge planning. Patient has now been made comfort care. CSW met with patient's son [name] and daughter Renate at bedside. Both children are in agreement to keep patient on comfort care. Family has appropriate questions regarding hospice services. CSW explained differences in hospice home vs home with hospice. Family feels that due to patient's pain control she would be better served in hospice home facility. Family has requested [facility]. CSW made referral to Santiago Glad, hospice home liaison. CSW will continue to follow for discharge planning.   [place], [place]

## 2018-08-27 IMAGING — DX DG LUMBAR SPINE COMPLETE 4+V
6 series · 6 of 6 positions shown · non-contrast
Comparison: Lumbar spine films of 03/29/2015

CLINICAL DATA: Low back and bilateral leg pain

EXAM:
LUMBAR SPINE - COMPLETE 4+ VIEW

[l-spine ap]
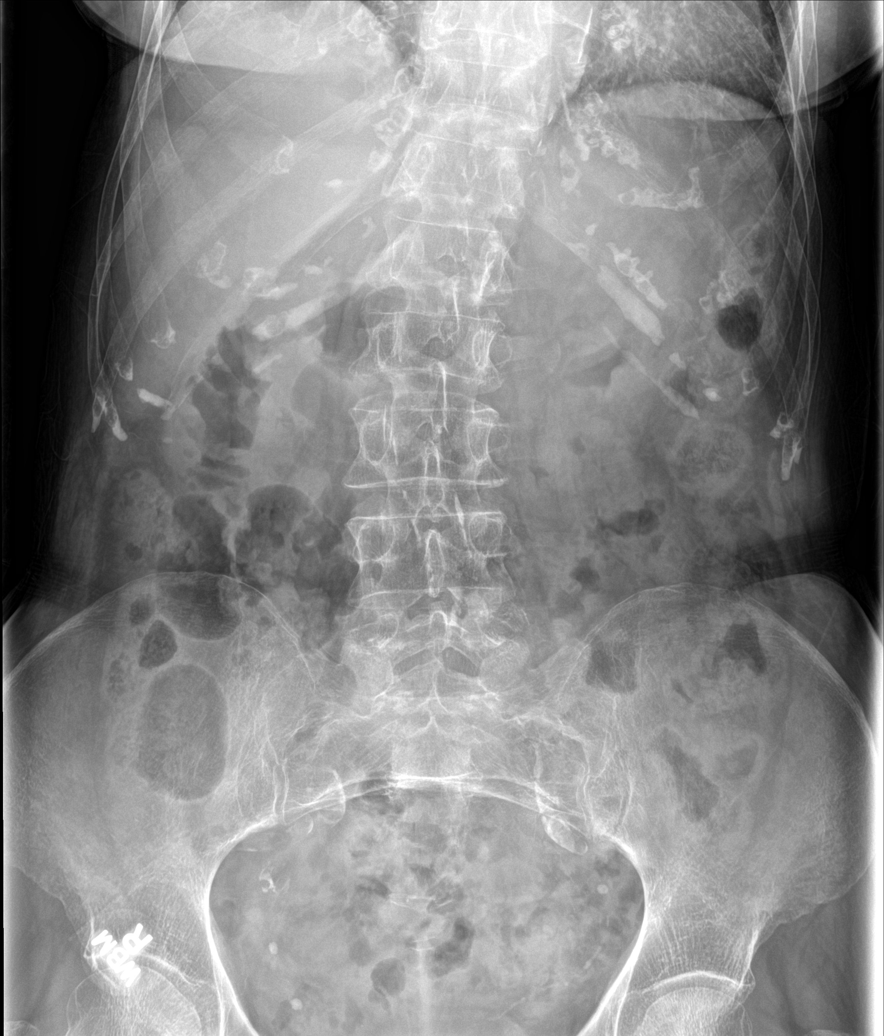

[l-spine obl (1 of 3)]
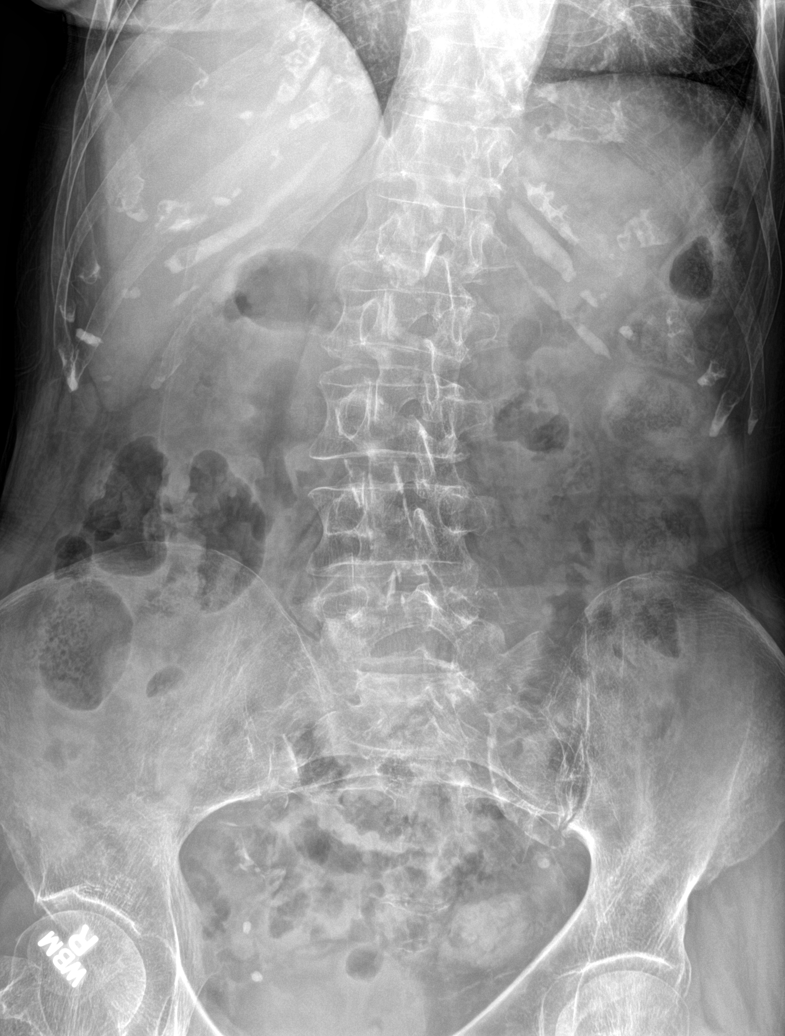

[l-spine obl (2 of 3)]
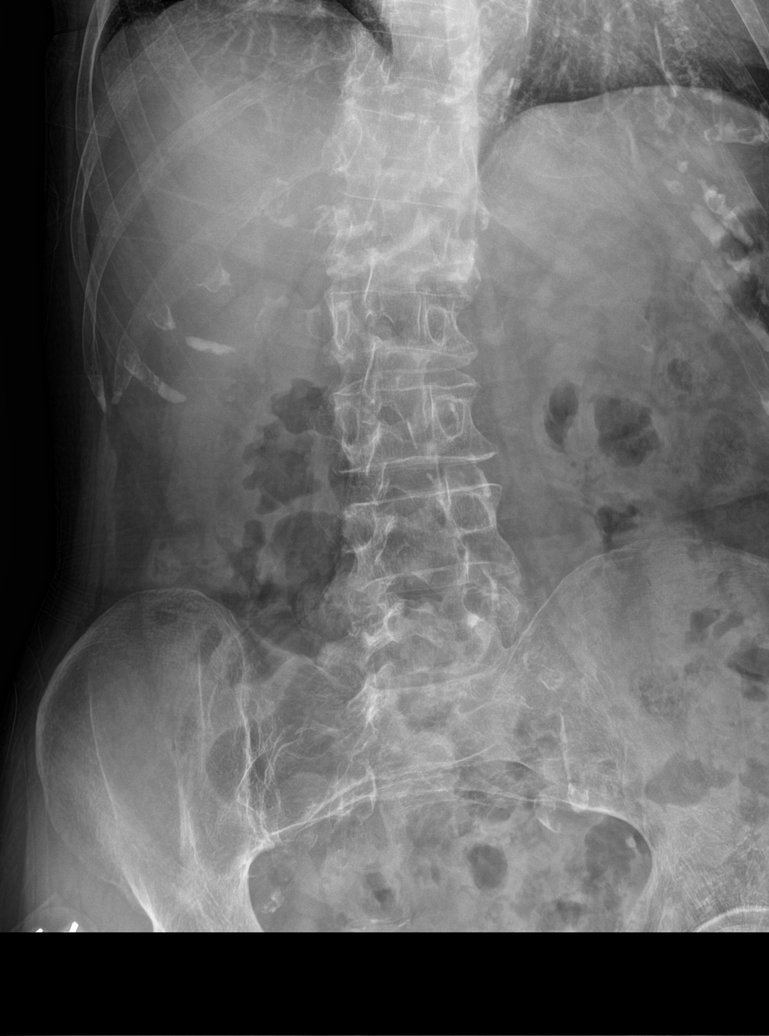

[l-spine lat]
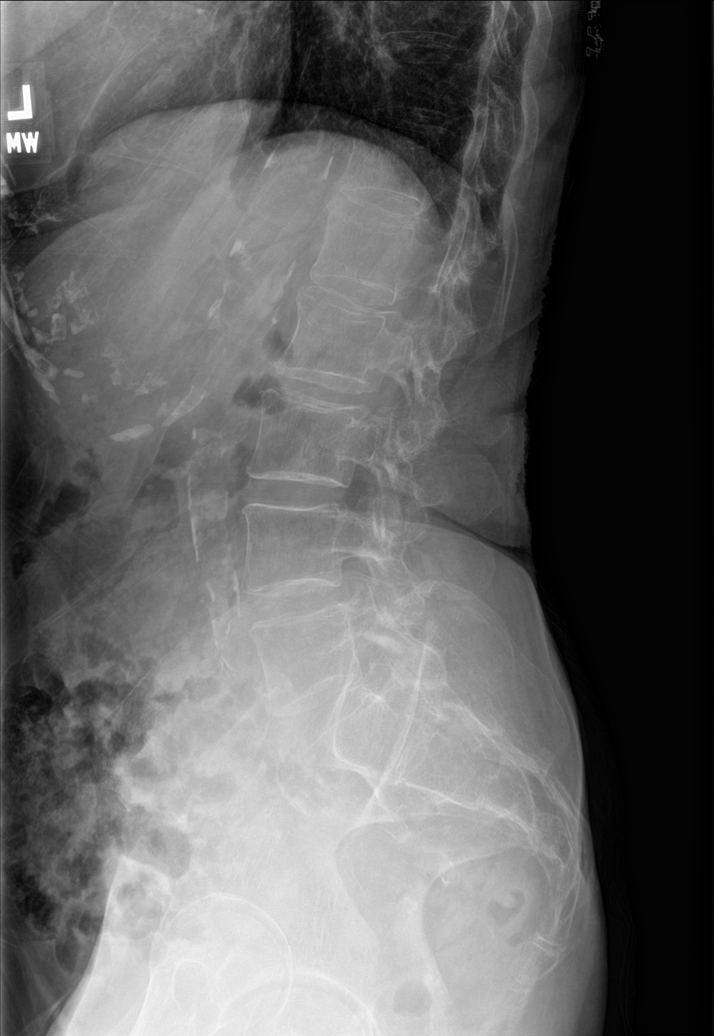

[l-spine spot]
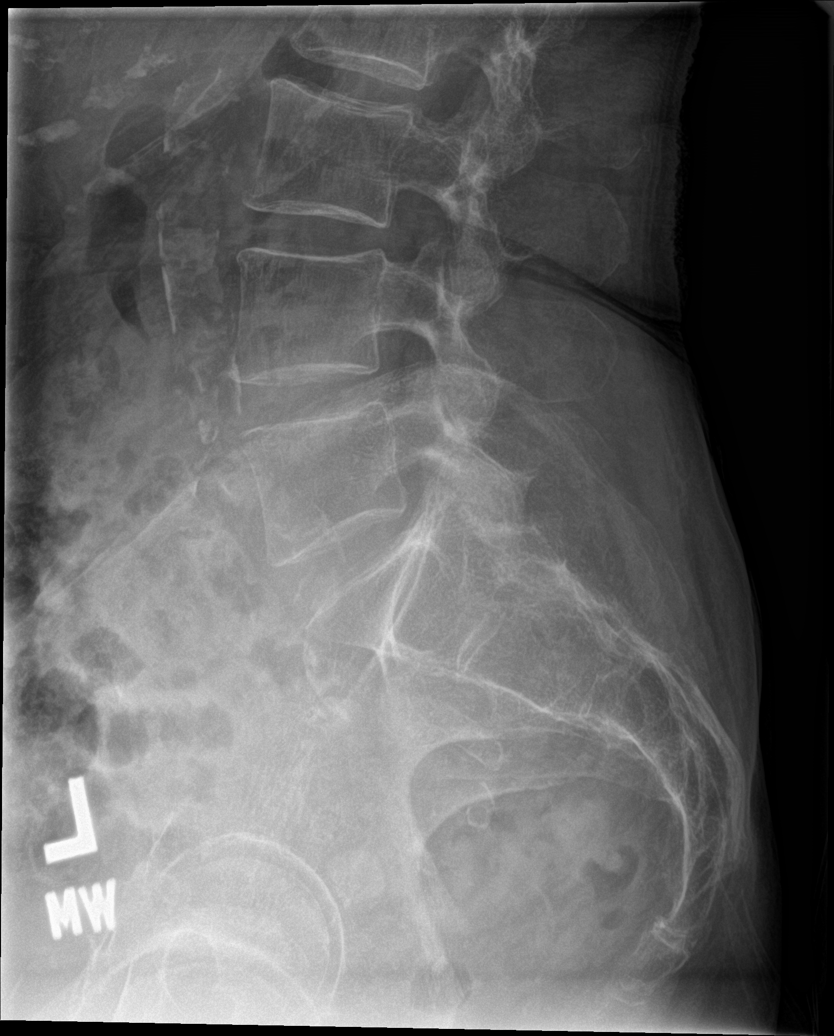

[l-spine obl (3 of 3)]
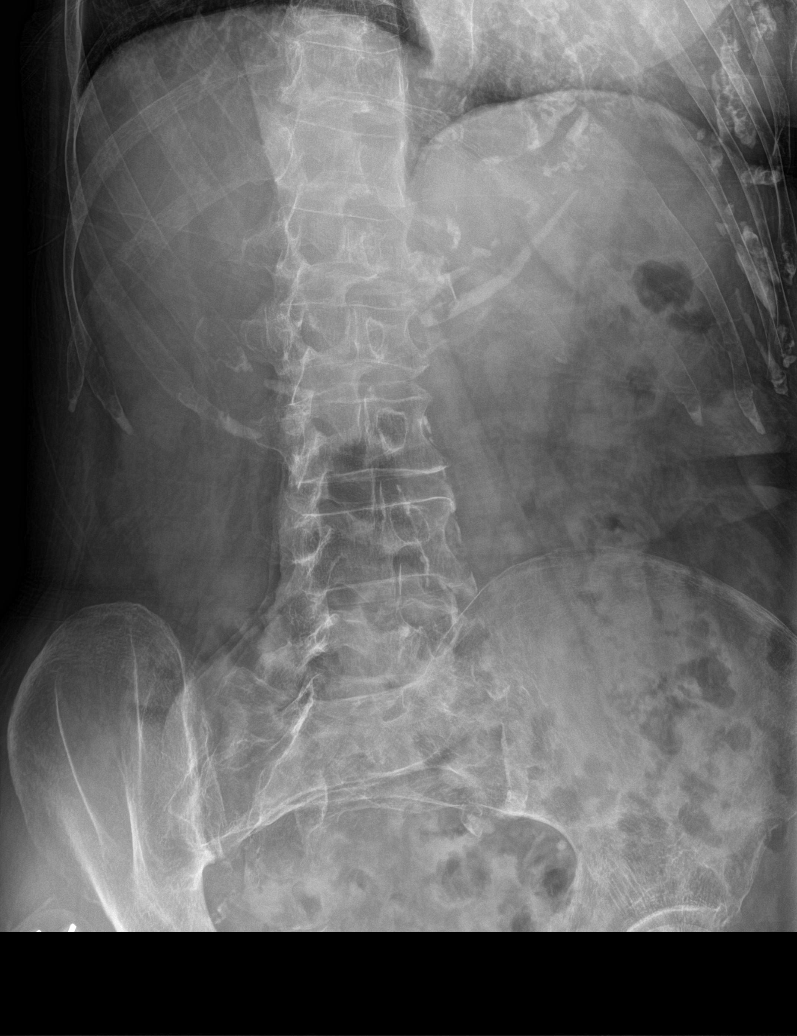

[6 of 6 positions shown; findings below may reference images not displayed]

FINDINGS: There is very mild curvature of the lumbar spine convex to the right
and the bones are somewhat osteopenic. The lumbar vertebrae are in
normal alignment. Intervertebral disc spaces. No compression
deformity is seen. The SI joints are corticated.
IMPRESSION: 1. No acute abnormality.  Normal alignment.
2. Normal intervertebral disc spaces.
3. Osteopenia.
